# Patient Record
Sex: Female | Born: 1970 | Race: White | Hispanic: No | Marital: Married | Smoking: Never smoker
Health system: Southern US, Community
[De-identification: ages and names within clinical notes are randomized; demographics above are authoritative.]

## PROBLEM LIST (undated history)

## (undated) DIAGNOSIS — E785 Hyperlipidemia, unspecified: Secondary | ICD-10-CM

## (undated) DIAGNOSIS — Z87442 Personal history of urinary calculi: Secondary | ICD-10-CM

## (undated) DIAGNOSIS — I519 Heart disease, unspecified: Secondary | ICD-10-CM

## (undated) DIAGNOSIS — Z8042 Family history of malignant neoplasm of prostate: Secondary | ICD-10-CM

## (undated) DIAGNOSIS — Z803 Family history of malignant neoplasm of breast: Secondary | ICD-10-CM

## (undated) DIAGNOSIS — Z923 Personal history of irradiation: Secondary | ICD-10-CM

## (undated) DIAGNOSIS — F419 Anxiety disorder, unspecified: Secondary | ICD-10-CM

## (undated) DIAGNOSIS — F32A Depression, unspecified: Secondary | ICD-10-CM

## (undated) DIAGNOSIS — N6019 Diffuse cystic mastopathy of unspecified breast: Secondary | ICD-10-CM

## (undated) DIAGNOSIS — R112 Nausea with vomiting, unspecified: Secondary | ICD-10-CM

## (undated) DIAGNOSIS — E0590 Thyrotoxicosis, unspecified without thyrotoxic crisis or storm: Secondary | ICD-10-CM

## (undated) DIAGNOSIS — Z9889 Other specified postprocedural states: Secondary | ICD-10-CM

## (undated) DIAGNOSIS — E0500 Thyrotoxicosis with diffuse goiter without thyrotoxic crisis or storm: Secondary | ICD-10-CM

## (undated) HISTORY — DX: Family history of malignant neoplasm of breast: Z80.3

## (undated) HISTORY — DX: Thyrotoxicosis, unspecified without thyrotoxic crisis or storm: E05.90

## (undated) HISTORY — PX: WISDOM TOOTH EXTRACTION: SHX21

## (undated) HISTORY — PX: OTHER SURGICAL HISTORY: SHX169

## (undated) HISTORY — PX: BREAST BIOPSY: SHX20

## (undated) HISTORY — DX: Heart disease, unspecified: I51.9

## (undated) HISTORY — PX: BREAST EXCISIONAL BIOPSY: SUR124

## (undated) HISTORY — DX: Thyrotoxicosis with diffuse goiter without thyrotoxic crisis or storm: E05.00

## (undated) HISTORY — DX: Hyperlipidemia, unspecified: E78.5

## (undated) HISTORY — DX: Family history of malignant neoplasm of prostate: Z80.42

## (undated) HISTORY — DX: Diffuse cystic mastopathy of unspecified breast: N60.19

---

## 2001-10-14 ENCOUNTER — Other Ambulatory Visit: Admission: RE | Admit: 2001-10-14 | Discharge: 2001-10-14 | Payer: Self-pay | Admitting: Family Medicine

## 2002-11-12 ENCOUNTER — Encounter: Payer: Self-pay | Admitting: Family Medicine

## 2002-11-12 ENCOUNTER — Encounter: Admission: RE | Admit: 2002-11-12 | Discharge: 2002-11-12 | Payer: Self-pay | Admitting: Family Medicine

## 2005-05-22 ENCOUNTER — Ambulatory Visit: Payer: Self-pay

## 2006-06-19 HISTORY — PX: BREAST BIOPSY: SHX20

## 2006-06-19 HISTORY — PX: ORBITAL RECONSTRUCTION: SHX2115

## 2007-01-18 ENCOUNTER — Ambulatory Visit: Payer: Self-pay | Admitting: Family Medicine

## 2007-01-30 ENCOUNTER — Ambulatory Visit: Payer: Self-pay | Admitting: General Surgery

## 2007-02-01 ENCOUNTER — Ambulatory Visit: Payer: Self-pay | Admitting: General Surgery

## 2007-07-22 ENCOUNTER — Ambulatory Visit: Payer: Self-pay | Admitting: General Surgery

## 2008-02-18 ENCOUNTER — Ambulatory Visit: Payer: Self-pay | Admitting: Oncology

## 2008-07-23 ENCOUNTER — Ambulatory Visit: Payer: Self-pay | Admitting: General Surgery

## 2009-02-04 LAB — HM PAP SMEAR: HM PAP: NEGATIVE

## 2009-06-19 HISTORY — PX: DILATION AND CURETTAGE OF UTERUS: SHX78

## 2009-07-26 ENCOUNTER — Ambulatory Visit: Payer: Self-pay | Admitting: General Surgery

## 2010-02-13 ENCOUNTER — Emergency Department: Payer: Self-pay | Admitting: Internal Medicine

## 2010-02-15 ENCOUNTER — Emergency Department: Payer: Self-pay | Admitting: Emergency Medicine

## 2010-02-16 ENCOUNTER — Ambulatory Visit: Payer: Self-pay | Admitting: Obstetrics & Gynecology

## 2010-07-27 ENCOUNTER — Ambulatory Visit: Payer: Self-pay | Admitting: General Surgery

## 2011-07-21 HISTORY — PX: BREAST BIOPSY: SHX20

## 2011-08-01 ENCOUNTER — Ambulatory Visit: Payer: Self-pay | Admitting: General Surgery

## 2011-08-04 ENCOUNTER — Ambulatory Visit: Payer: Self-pay | Admitting: General Surgery

## 2012-08-27 ENCOUNTER — Encounter: Payer: Self-pay | Admitting: *Deleted

## 2012-08-29 ENCOUNTER — Ambulatory Visit: Payer: Self-pay | Admitting: General Surgery

## 2012-09-03 ENCOUNTER — Encounter: Payer: Self-pay | Admitting: *Deleted

## 2012-09-04 ENCOUNTER — Encounter: Payer: Self-pay | Admitting: General Surgery

## 2012-09-04 ENCOUNTER — Ambulatory Visit: Payer: No Typology Code available for payment source | Admitting: General Surgery

## 2012-09-04 ENCOUNTER — Other Ambulatory Visit (INDEPENDENT_AMBULATORY_CARE_PROVIDER_SITE_OTHER): Payer: Self-pay

## 2012-09-04 VITALS — BP 140/80 | HR 80 | Resp 12 | Ht 64.0 in | Wt 184.0 lb

## 2012-09-04 DIAGNOSIS — N63 Unspecified lump in unspecified breast: Secondary | ICD-10-CM

## 2012-09-04 NOTE — Progress Notes (Signed)
Subjective:     Patient ID: Beth [name], female   DOB: 1970-10-04, 42 y.o.   MRN: [id number]  HPI 42 yr old female with prior breast mass and FH of breastr ca-mat GM, seen in yrly f/u. No breast complaints. No new health issues.Patient here today for follow up  mammogram.     Review of Systems  Constitutional: Negative.   Respiratory: Negative.   Cardiovascular: Negative.        Objective:   Physical Exam  Constitutional: She is oriented to person, place, and time. She appears well-developed and well-nourished.  Cardiovascular: Normal rate, regular rhythm and normal heart sounds.   Pulmonary/Chest: Effort normal and breath sounds normal. Right breast exhibits mass. Right breast exhibits no inverted nipple, no nipple discharge, no skin change and no tenderness. Left breast exhibits no inverted nipple, no mass, no nipple discharge, no skin change and no tenderness. Breasts are symmetrical.    Abdominal: Soft. Normal appearance. There is no tenderness.  Lymphadenopathy:    She has no cervical adenopathy.    She has no axillary adenopathy.  Neurological: She is alert and oriented to person, place, and time.  Skin: Skin is warm and dry.       Assessment:     Focal thickening in right breast at 11 o'clock was evaluated with ultrasound showing vague finding.  Likely this associated with previous biopsy but will need close follow up.    Plan:     Plan for recheck in 4 mos. Pt advised to call for any noticed changes in the interval.

## 2013-01-08 ENCOUNTER — Encounter: Payer: Self-pay | Admitting: General Surgery

## 2013-01-08 ENCOUNTER — Ambulatory Visit (INDEPENDENT_AMBULATORY_CARE_PROVIDER_SITE_OTHER): Payer: No Typology Code available for payment source | Admitting: General Surgery

## 2013-01-08 VITALS — BP 124/80 | HR 76 | Resp 14 | Ht 64.0 in | Wt 181.0 lb

## 2013-01-08 DIAGNOSIS — N63 Unspecified lump in unspecified breast: Secondary | ICD-10-CM

## 2013-01-08 NOTE — Progress Notes (Signed)
Patient ID: Beth [name], female   DOB: [birth year], 42 y.o.   MRN: [id number]  Chief Complaint  Patient presents with  . Follow-up    4 month follow up right breast ultrasound    HPI Beth [name] is a 42 y.o. female who presents for a 4 month follow up right breast evaluation  HPI  Past Medical History  Diagnosis Date  . Graves disease   . Cardiac disease   . Family history of malignant neoplasm of breast   . Hyperthyroidism   . Diffuse cystic mastopathy     Past Surgical History  Procedure Laterality Date  . Orbital reconstruction Left 2008  . Eyelid surgery     . Wisdom tooth extraction    . Dilation and curettage of uterus  2011  . Breast biopsy  2008  . Breast biopsy Left 2/13     aspiration cyst    Family History  Problem Relation Age of Onset  . Breast cancer Maternal Grandmother     Social History History  Substance Use Topics  . Smoking status: Never Smoker   . Smokeless tobacco: Never Used  . Alcohol Use: Yes    No Known Allergies  Current Outpatient Prescriptions  Medication Sig Dispense Refill  . Desogestrel-Ethinyl Estradiol (MIRCETTE PO) Take by mouth.      . FIBER FORMULA PO Take by mouth.      . fish oil-omega-3 fatty acids 1000 MG capsule Take 2 [name] by mouth daily.      . Garlic Oil 1000 MG CAPS Take by mouth.      . Multiple Vitamins-Minerals (MULTIVITAMIN PO) Take by mouth.      . traZODone (DESYREL) 100 MG tablet Take 100 mg by mouth at bedtime.       No current facility-administered medications for this visit.    Review of Systems Review of Systems  Constitutional: Negative.   Respiratory: Negative.   Cardiovascular: Negative.     Blood pressure 124/80, pulse 76, resp. rate 14, height 5\' 4"  (1.626 m), weight 181 lb (82.101 kg), last menstrual period 12/26/2012.  Physical Exam Physical Exam  Constitutional: She is oriented to person, place, and time. She appears well-developed and well-nourished.  Pulmonary/Chest:    Neurological:  She is alert and oriented to person, place, and time.  Skin: Skin is warm and dry.    Data Reviewed None  Assessment    Mass in the right breast resolved.      Plan    Return in 1 year for follow up.         Beth [name] 01/08/2013, 7:17 PM

## 2013-01-08 NOTE — Patient Instructions (Addendum)
Patient to return in 8 months with yearly mammogram.  Continue monthly self breast exam.

## 2013-03-07 ENCOUNTER — Ambulatory Visit: Payer: Self-pay | Admitting: Oncology

## 2013-03-17 ENCOUNTER — Ambulatory Visit: Payer: Self-pay | Admitting: Oncology

## 2013-03-18 LAB — CBC CANCER CENTER
Eosinophil #: 0.1 x10 3/mm (ref 0.0–0.7)
HCT: 40.1 % (ref 35.0–47.0)
HGB: 13.2 g/dL (ref 12.0–16.0)
MCH: 28.9 pg (ref 26.0–34.0)
MCHC: 33 g/dL (ref 32.0–36.0)
Monocyte %: 4.4 %
Neutrophil #: 8.5 x10 3/mm — ABNORMAL HIGH (ref 1.4–6.5)
Platelet: 443 x10 3/mm — ABNORMAL HIGH (ref 150–440)
RDW: 13.8 % (ref 11.5–14.5)
WBC: 11.5 x10 3/mm — ABNORMAL HIGH (ref 3.6–11.0)

## 2013-03-19 ENCOUNTER — Ambulatory Visit: Payer: Self-pay | Admitting: Oncology

## 2013-04-19 ENCOUNTER — Ambulatory Visit: Payer: Self-pay | Admitting: Oncology

## 2013-09-01 ENCOUNTER — Encounter: Payer: Self-pay | Admitting: General Surgery

## 2013-09-01 ENCOUNTER — Ambulatory Visit: Payer: Self-pay | Admitting: General Surgery

## 2013-09-01 NOTE — Progress Notes (Signed)
Quick Note:  Make sure the additional views are done prior to her office visit ______ 

## 2013-09-15 ENCOUNTER — Ambulatory Visit: Payer: Self-pay | Admitting: General Surgery

## 2013-09-15 ENCOUNTER — Encounter: Payer: Self-pay | Admitting: General Surgery

## 2013-09-23 ENCOUNTER — Encounter: Payer: Self-pay | Admitting: General Surgery

## 2013-09-23 ENCOUNTER — Ambulatory Visit (INDEPENDENT_AMBULATORY_CARE_PROVIDER_SITE_OTHER): Payer: No Typology Code available for payment source | Admitting: General Surgery

## 2013-09-23 VITALS — BP 120/78 | HR 80 | Resp 14 | Ht 64.0 in | Wt 189.0 lb

## 2013-09-23 DIAGNOSIS — Z803 Family history of malignant neoplasm of breast: Secondary | ICD-10-CM

## 2013-09-23 DIAGNOSIS — N6019 Diffuse cystic mastopathy of unspecified breast: Secondary | ICD-10-CM

## 2013-09-23 NOTE — Progress Notes (Signed)
Patient ID: Beth [name], female   DOB: [birth year], 43 y.o.   MRN: [id number]  Chief Complaint  Patient presents with  . Follow-up    8 month screening mammogram    HPI Beth [name] is a 43 y.o. female who presents for a breast evaluation. The most recent mammogram was done on 09/15/13. Patient does perform regular self breast checks and gets regular mammograms done. The patient denies any new problems with her breasts at this time.     HPI  Past Medical History  Diagnosis Date  . Graves disease   . Cardiac disease   . Family history of malignant neoplasm of breast   . Hyperthyroidism   . Diffuse cystic mastopathy     Past Surgical History  Procedure Laterality Date  . Orbital reconstruction Left 2008  . Eyelid surgery     . Wisdom tooth extraction    . Dilation and curettage of uterus  2011  . Breast biopsy  2008  . Breast biopsy Left 2/13     aspiration cyst    Family History  Problem Relation Age of Onset  . Breast cancer Maternal Grandmother     Social History History  Substance Use Topics  . Smoking status: Never Smoker   . Smokeless tobacco: Never Used  . Alcohol Use: Yes    No Known Allergies  Current Outpatient Prescriptions  Medication Sig Dispense Refill  . CRANBERRY PO Take 2 tablets by mouth 2 (two) times daily.      . Desogestrel-Ethinyl Estradiol (MIRCETTE PO) Take by mouth.      . FIBER FORMULA PO Take by mouth.      . fish oil-omega-3 fatty acids 1000 MG capsule Take 2 g by mouth daily.      . Garlic Oil 2725 MG CAPS Take by mouth.      . Ibuprofen-Diphenhydramine Cit (ADVIL PM PO) Take 1 tablet by mouth as needed.      . Multiple Vitamins-Minerals (MULTIVITAMIN PO) Take by mouth.       No current facility-administered medications for this visit.    Review of Systems Review of Systems  Constitutional: Negative.   Respiratory: Negative.   Cardiovascular: Negative.     Blood pressure 120/78, pulse 80, resp. rate 14, height 5\' 4"  (1.626 m),  weight 189 lb (85.73 kg), last menstrual period 09/04/2013.  Physical Exam Physical Exam  Constitutional: She is oriented to person, place, and time. She appears well-developed and well-nourished.  Eyes: Conjunctivae are normal. No scleral icterus.  Neck: Neck supple. No thyromegaly present.  Cardiovascular: Normal rate, regular rhythm and normal heart sounds.   No murmur heard. Pulmonary/Chest: Effort normal and breath sounds normal. Right breast exhibits no inverted nipple, no mass, no nipple discharge, no skin change and no tenderness. Left breast exhibits no inverted nipple, no mass, no nipple discharge, no skin change and no tenderness.  Lymphadenopathy:    She has no cervical adenopathy.    She has no axillary adenopathy.  Neurological: She is alert and oriented to person, place, and time.  Skin: Skin is warm and dry.    Data Reviewed Mammogram and US showing a cyst in right breast  Assessment    FCD, exam stable.     Plan    61yr f/u with bil screening mammogram.        Mahathi Pokorney G Tyjah Hai 09/25/2013, 8:22 AM

## 2013-09-23 NOTE — Patient Instructions (Addendum)
Patient to return in 1 year with bilateral screening mammogram. Continue self breast exams. Call office for any new breast issues or concerns.  

## 2013-09-25 ENCOUNTER — Encounter: Payer: Self-pay | Admitting: General Surgery

## 2013-09-25 DIAGNOSIS — N6019 Diffuse cystic mastopathy of unspecified breast: Secondary | ICD-10-CM | POA: Insufficient documentation

## 2013-09-25 DIAGNOSIS — Z803 Family history of malignant neoplasm of breast: Secondary | ICD-10-CM | POA: Insufficient documentation

## 2013-11-13 ENCOUNTER — Emergency Department: Payer: Self-pay | Admitting: Emergency Medicine

## 2013-12-08 ENCOUNTER — Emergency Department: Payer: Self-pay | Admitting: Emergency Medicine

## 2014-01-19 LAB — BASIC METABOLIC PANEL
BUN: 12 mg/dL (ref 4–21)
CREATININE: 0.6 mg/dL (ref 0.5–1.1)
GLUCOSE: 97 mg/dL
POTASSIUM: 4.7 mmol/L (ref 3.4–5.3)
Sodium: 139 mmol/L (ref 137–147)

## 2014-01-19 LAB — LIPID PANEL
Cholesterol: 251 mg/dL — AB (ref 0–200)
HDL: 71 mg/dL — AB (ref 35–70)
LDL Cholesterol: 157 mg/dL
TRIGLYCERIDES: 115 mg/dL (ref 40–160)

## 2014-01-19 LAB — TSH: TSH: 1.32 u[IU]/mL (ref 0.41–5.90)

## 2014-01-19 LAB — HEPATIC FUNCTION PANEL
ALT: 20 U/L (ref 7–35)
AST: 13 U/L (ref 13–35)

## 2014-01-19 LAB — CBC AND DIFFERENTIAL
HCT: 38 % (ref 36–46)
Hemoglobin: 12.7 g/dL (ref 12.0–16.0)
Platelets: 455 10*3/uL — AB (ref 150–399)
WBC: 11.7 10^3/mL

## 2014-04-20 ENCOUNTER — Encounter: Payer: Self-pay | Admitting: General Surgery

## 2014-08-18 LAB — HEMOGLOBIN A1C: HEMOGLOBIN A1C: 5.5 % (ref 4.0–6.0)

## 2014-10-05 ENCOUNTER — Encounter: Payer: Self-pay | Admitting: General Surgery

## 2014-10-07 ENCOUNTER — Encounter: Payer: Self-pay | Admitting: General Surgery

## 2014-10-07 ENCOUNTER — Ambulatory Visit (INDEPENDENT_AMBULATORY_CARE_PROVIDER_SITE_OTHER): Payer: No Typology Code available for payment source | Admitting: General Surgery

## 2014-10-07 VITALS — BP 128/74 | HR 76 | Resp 12 | Ht 62.0 in | Wt 185.0 lb

## 2014-10-07 DIAGNOSIS — N6019 Diffuse cystic mastopathy of unspecified breast: Secondary | ICD-10-CM | POA: Diagnosis not present

## 2014-10-07 DIAGNOSIS — Z803 Family history of malignant neoplasm of breast: Secondary | ICD-10-CM

## 2014-10-07 DIAGNOSIS — N63 Unspecified lump in unspecified breast: Secondary | ICD-10-CM

## 2014-10-07 NOTE — Patient Instructions (Signed)
Continue self breast exams. Call office for any new breast issues or concerns. 

## 2014-10-07 NOTE — Progress Notes (Signed)
Patient ID: Beth [name], female   DOB: 1971-03-03, 44 y.o.   MRN: [id number]  Chief Complaint  Patient presents with  . Follow-up    mammogram    HPI Beth [name] is a 44 y.o. female. who presents for a breast evaluation. The most recent mammogram was done on 10/01/14 .  Patient does perform regular self breast checks and gets regular mammograms done.  Occasional itching of the right breast/dry skin.    HPI  Past Medical History  Diagnosis Date  . Graves disease   . Cardiac disease   . Family history of malignant neoplasm of breast   . Hyperthyroidism   . Diffuse cystic mastopathy     Past Surgical History  Procedure Laterality Date  . Orbital reconstruction Left 2008  . Eyelid surgery     . Wisdom tooth extraction    . Dilation and curettage of uterus  2011  . Breast biopsy  2008  . Breast biopsy Left 2/13     aspiration cyst    Family History  Problem Relation Age of Onset  . Breast cancer Maternal Grandmother     Social History History  Substance Use Topics  . Smoking status: Never Smoker   . Smokeless tobacco: Never Used  . Alcohol Use: Yes    No Known Allergies  Current Outpatient Prescriptions  Medication Sig Dispense Refill  . CRANBERRY PO Take 2 tablets by mouth 2 (two) times daily.    . Desogestrel-Ethinyl Estradiol (MIRCETTE PO) Take by mouth.    . fish oil-omega-3 fatty acids 1000 MG capsule Take 2 g by mouth daily.    . Garlic Oil 3500 MG CAPS Take by mouth.    . Ibuprofen-Diphenhydramine Cit (ADVIL PM PO) Take 1 tablet by mouth as needed.    . Multiple Vitamins-Minerals (MULTIVITAMIN PO) Take by mouth.     No current facility-administered medications for this visit.    Review of Systems Review of Systems  Constitutional: Negative.   Respiratory: Negative.   Cardiovascular: Negative.     Blood pressure 128/74, pulse 76, resp. rate 12, height 5\' 2"  (1.575 m), weight 185 lb (83.915 kg), last menstrual period 10/01/2014.  Physical  Exam Physical Exam  Constitutional: She is oriented to person, place, and time. She appears well-developed and well-nourished.  Eyes: Conjunctivae are normal. No scleral icterus.  Neck: Neck supple.  Cardiovascular: Normal rate, regular rhythm and normal heart sounds.   Pulmonary/Chest: Effort normal and breath sounds normal. Right breast exhibits no inverted nipple, no mass, no nipple discharge, no skin change and no tenderness. Left breast exhibits no inverted nipple, no mass, no nipple discharge, no skin change and no tenderness.  Abdominal: Soft. Bowel sounds are normal. There is no hepatomegaly. There is no tenderness.  Lymphadenopathy:    She has no cervical adenopathy.    She has no axillary adenopathy.  Neurological: She is alert and oriented to person, place, and time.  Skin: Skin is warm and dry.    Data Reviewed Mammogram reviewed and stable benign nodule left breast. Remote FH of breast cancer  Assessment    Stable exam, fibrocystic disease.       Plan    The patient has been asked to return to the office in one year with a bilateral screening mammogram.   PCP:  Otho Darner 10/07/2014, 11:35 AM

## 2014-10-23 DIAGNOSIS — E0500 Thyrotoxicosis with diffuse goiter without thyrotoxic crisis or storm: Secondary | ICD-10-CM | POA: Insufficient documentation

## 2014-10-23 DIAGNOSIS — G4700 Insomnia, unspecified: Secondary | ICD-10-CM | POA: Insufficient documentation

## 2014-10-23 DIAGNOSIS — R739 Hyperglycemia, unspecified: Secondary | ICD-10-CM | POA: Insufficient documentation

## 2014-10-23 DIAGNOSIS — J309 Allergic rhinitis, unspecified: Secondary | ICD-10-CM | POA: Insufficient documentation

## 2014-11-20 ENCOUNTER — Encounter: Payer: Self-pay | Admitting: General Surgery

## 2015-01-15 ENCOUNTER — Ambulatory Visit (INDEPENDENT_AMBULATORY_CARE_PROVIDER_SITE_OTHER): Payer: PRIVATE HEALTH INSURANCE | Admitting: Family Medicine

## 2015-01-15 ENCOUNTER — Encounter: Payer: Self-pay | Admitting: Family Medicine

## 2015-01-15 VITALS — BP 112/68 | HR 80 | Temp 98.2°F | Resp 16 | Ht 65.0 in | Wt 185.0 lb

## 2015-01-15 DIAGNOSIS — R739 Hyperglycemia, unspecified: Secondary | ICD-10-CM

## 2015-01-15 DIAGNOSIS — E7800 Pure hypercholesterolemia, unspecified: Secondary | ICD-10-CM

## 2015-01-15 DIAGNOSIS — E0500 Thyrotoxicosis with diffuse goiter without thyrotoxic crisis or storm: Secondary | ICD-10-CM

## 2015-01-15 DIAGNOSIS — Z0000 Encounter for general adult medical examination without abnormal findings: Secondary | ICD-10-CM | POA: Diagnosis not present

## 2015-01-15 LAB — POCT URINALYSIS DIPSTICK
BILIRUBIN UA: NEGATIVE
Blood, UA: NEGATIVE
GLUCOSE UA: NEGATIVE
Ketones, UA: NEGATIVE
Leukocytes, UA: NEGATIVE
Nitrite, UA: NEGATIVE
PH UA: 7
Protein, UA: NEGATIVE
Spec Grav, UA: 1.02
Urobilinogen, UA: 0.2

## 2015-01-15 NOTE — Progress Notes (Signed)
Patient ID: Beth [name], female   DOB: 1970-11-25, 44 y.o.   MRN: [id number]       Patient: Beth [name], Female    DOB: 22-Dec-1970, 44 y.o.   MRN: [id number] Visit Date: 01/17/2015  Today's Provider: Margarita Rana, MD   Chief Complaint  Patient presents with  . Annual Exam   Subjective:    Annual physical exam Beth [name] is a 44 y.o. female who presents today for health maintenance and complete physical. She feels well. She reports she is trying to exercising, on average she tries yo exercise about 3 times a week. She reports she is sleeping fairly well. Pt gets pap smear and mammograms done through her GYN.   ----------------------------------------------------------------- Pap-- 12/2013 done at [facility]- 09/2014 EKG-- 12/15/11 Never had a colonoscopy.    Review of Systems  Constitutional: Negative.   HENT: Negative.   Eyes: Negative.   Respiratory: Negative.   Cardiovascular: Negative.   Gastrointestinal:       Occasional heart burn at night  Endocrine: Negative.   Genitourinary: Negative.   Musculoskeletal: Negative.   Skin: Negative.   Allergic/Immunologic: Negative.   Neurological: Negative.   Hematological: Negative.   Psychiatric/Behavioral: Negative.     Social History She  reports that she has never smoked. She has never used smokeless tobacco. She reports that she drinks alcohol. She reports that she does not use illicit drugs.  Patient Active Problem List   Diagnosis Date Noted  . Allergic rhinitis 10/23/2014  . Graves disease 10/23/2014  . Blood glucose elevated 10/23/2014  . Cannot sleep 10/23/2014  . Family history of breast cancer 09/25/2013  . Diffuse cystic mastopathy 09/25/2013  . 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 Hypercholesteremia 11/17/2008  . Dupuytren's contracture of foot 11/17/2008    Past Surgical History  Procedure Laterality Date  . Orbital reconstruction Left 2008  . Eyelid surgery     . Wisdom tooth extraction    .  Dilation and curettage of uterus  2011  . Breast biopsy  2008  . Breast biopsy Left 2/13     aspiration cyst    Family History Her family history includes Breast cancer in her maternal grandmother; Diabetes in her mother and sister; Hyperlipidemia in her father, mother, sister, sister, and sister; Hypertension in her father, mother, sister, sister, and sister; Hypothyroidism in her father; Stroke in her father.    No Known Allergies  Previous Medications   ACETAMINOPHEN (TYLENOL) 325 MG TABLET    Take by mouth.   CALCIUM CARBONATE-VITAMIN D 600-200 MG-UNIT TABS    Take by mouth.   CRANBERRY PO    Take 2 tablets by mouth 2 (two) times daily.   DESOGESTREL-ETHINYL ESTRADIOL (MIRCETTE PO)    Take by mouth.   FISH OIL-OMEGA-3 FATTY ACIDS 1000 MG CAPSULE    Take 2 g by mouth daily.   GARLIC OIL 1829 MG CAPS    Take by mouth.   IBUPROFEN (ADVIL) 200 MG CAPS    Take by mouth as needed.   IBUPROFEN-DIPHENHYDRAMINE CIT (ADVIL PM PO)    Take 1 tablet by mouth as needed.   MULTIPLE VITAMINS-MINERALS (MULTIVITAMIN PO)    Take by mouth.    Patient Care Team: Margarita Rana, MD as PCP - General (Family Medicine) Seeplaputhur Robinette Haines, MD (General Surgery) Margarita Rana, MD as Referring Physician (Family Medicine)     Objective:   Vitals: BP 112/68 mmHg  Pulse 80  Temp(Src) 98.2 F (36.8 C) (Oral)  Resp 16  Ht 5\' 5"  (1.651 m)  Wt 185 lb (83.915 kg)  BMI 30.79 kg/m2  LMP 10/01/2014   Physical Exam  Constitutional: She is oriented to person, place, and time. She appears well-developed and well-nourished.  Eyes: Conjunctivae and EOM are normal. Pupils are equal, round, and reactive to light.  Neck: Normal range of motion. Neck supple.  Cardiovascular: Normal rate and regular rhythm.   Pulmonary/Chest: Effort normal and breath sounds normal.  Abdominal: Soft. Bowel sounds are normal.  Musculoskeletal: Normal range of motion.  Neurological: She is alert and oriented to person, place, and  time.  Psychiatric: She has a normal mood and affect. Her behavior is normal. Judgment and thought content normal.     Assessment & Plan:     Routine Health Maintenance and Physical Exam  Exercise Activities and Dietary recommendations Goals    . Exercise 150 minutes per week (moderate activity)       Immunization History  Administered Date(s) Administered  . Tdap 01/31/2008    Health Maintenance  Topic Date Due  . HIV Screening  09/23/1984  . PAP SMEAR  02/05/2012  . INFLUENZA VACCINE  01/18/2015  . TETANUS/TDAP  01/30/2018   - POCT urinalysis dipstick Results for orders placed or performed in visit on 01/15/15  POCT urinalysis dipstick  Result Value Ref Range   Color, UA amber    Clarity, UA clear    Glucose, UA neg    Bilirubin, UA neg    Ketones, UA neg    Spec Grav, UA 1.020    Blood, UA neg    pH, UA 7.0    Protein, UA neg    Urobilinogen, UA 0.2    Nitrite, UA neg    Leukocytes, UA Negative Negative    2. Graves disease Check labs.  - TSH  3. Hypercholesteremia- Check labs.  - CBC with Differential/Platelet - Lipid panel - Comprehensive metabolic panel  4. Blood glucose elevated Check labs.  - Hemoglobin A1c  Discussed health benefits of physical activity, and encouraged her to engage in regular exercise appropriate for her age and condition.    --------------------------------------------------------------------  Margarita Rana, MD

## 2015-01-28 LAB — HM PAP SMEAR: HM PAP: NEGATIVE

## 2015-01-29 ENCOUNTER — Telehealth: Payer: Self-pay

## 2015-01-29 LAB — COMPREHENSIVE METABOLIC PANEL
ALBUMIN: 4.2 g/dL (ref 3.5–5.5)
ALT: 18 IU/L (ref 0–32)
AST: 16 IU/L (ref 0–40)
Albumin/Globulin Ratio: 1.8 (ref 1.1–2.5)
Alkaline Phosphatase: 80 IU/L (ref 39–117)
BUN/Creatinine Ratio: 18 (ref 9–23)
BUN: 10 mg/dL (ref 6–24)
Bilirubin Total: 0.3 mg/dL (ref 0.0–1.2)
CALCIUM: 9.9 mg/dL (ref 8.7–10.2)
CHLORIDE: 100 mmol/L (ref 97–108)
CO2: 22 mmol/L (ref 18–29)
Creatinine, Ser: 0.55 mg/dL — ABNORMAL LOW (ref 0.57–1.00)
GFR calc Af Amer: 131 mL/min/{1.73_m2} (ref >59)
GFR calc non Af Amer: 114 mL/min/{1.73_m2} (ref >59)
GLUCOSE: 90 mg/dL (ref 65–99)
Globulin, Total: 2.3 g/dL (ref 1.5–4.5)
POTASSIUM: 5.2 mmol/L (ref 3.5–5.2)
Sodium: 141 mmol/L (ref 134–144)
TOTAL PROTEIN: 6.5 g/dL (ref 6.0–8.5)

## 2015-01-29 LAB — LIPID PANEL
CHOL/HDL RATIO: 3.9 ratio (ref 0.0–4.4)
Cholesterol, Total: 263 mg/dL — ABNORMAL HIGH (ref 100–199)
HDL: 68 mg/dL (ref >39)
LDL Calculated: 170 mg/dL — ABNORMAL HIGH (ref 0–99)
Triglycerides: 126 mg/dL (ref 0–149)
VLDL CHOLESTEROL CAL: 25 mg/dL (ref 5–40)

## 2015-01-29 LAB — CBC WITH DIFFERENTIAL/PLATELET
BASOS ABS: 0 10*3/uL (ref 0.0–0.2)
Basos: 0 %
EOS (ABSOLUTE): 0.1 10*3/uL (ref 0.0–0.4)
EOS: 1 %
HEMATOCRIT: 39.4 % (ref 34.0–46.6)
HEMOGLOBIN: 12.7 g/dL (ref 11.1–15.9)
IMMATURE GRANS (ABS): 0 10*3/uL (ref 0.0–0.1)
Immature Granulocytes: 0 %
Lymphocytes Absolute: 3.5 10*3/uL — ABNORMAL HIGH (ref 0.7–3.1)
Lymphs: 30 %
MCH: 28.8 pg (ref 26.6–33.0)
MCHC: 32.2 g/dL (ref 31.5–35.7)
MCV: 89 fL (ref 79–97)
MONOCYTES: 5 %
Monocytes Absolute: 0.6 10*3/uL (ref 0.1–0.9)
Neutrophils Absolute: 7.3 10*3/uL — ABNORMAL HIGH (ref 1.4–7.0)
Neutrophils: 64 %
PLATELETS: 425 10*3/uL — AB (ref 150–379)
RBC: 4.41 x10E6/uL (ref 3.77–5.28)
RDW: 14 % (ref 12.3–15.4)
WBC: 11.5 10*3/uL — ABNORMAL HIGH (ref 3.4–10.8)

## 2015-01-29 LAB — TSH: TSH: 0.951 u[IU]/mL (ref 0.450–4.500)

## 2015-01-29 LAB — HEMOGLOBIN A1C
Est. average glucose Bld gHb Est-mCnc: 120 mg/dL
Hgb A1c MFr Bld: 5.8 % — ABNORMAL HIGH (ref 4.8–5.6)

## 2015-01-29 NOTE — Telephone Encounter (Signed)
Advised pt of lab results. Pt verbally acknowledges understanding. Pt would like to work on lifestyle changes. Renaldo Fiddler, CMA

## 2015-01-29 NOTE — Telephone Encounter (Signed)
-----   Message from Margarita Rana, MD sent at 01/29/2015  4:36 PM EDT ----- Labs stable. White cell count is unchanged. Cholesterol is elevated at 263.  Good cholesterol is high at 68 which is good but does still have high bad cholesterol at 170.  10 year risk of heart disease calculated at 2 percent. Can start medication for better control or continue lifestyle changes and recheck annually. Thanks.

## 2015-04-19 ENCOUNTER — Ambulatory Visit
Admission: EM | Admit: 2015-04-19 | Discharge: 2015-04-19 | Disposition: A | Discharge status: Home / Self Care | Payer: PRIVATE HEALTH INSURANCE | Attending: Family Medicine | Admitting: Family Medicine

## 2015-04-19 DIAGNOSIS — N390 Urinary tract infection, site not specified: Secondary | ICD-10-CM | POA: Diagnosis not present

## 2015-04-19 LAB — URINALYSIS COMPLETE WITH MICROSCOPIC (ARMC ONLY)
Bilirubin Urine: NEGATIVE
GLUCOSE, UA: NEGATIVE mg/dL
Nitrite: NEGATIVE
PH: 7.5 (ref 5.0–8.0)
PROTEIN: 100 mg/dL — AB
SPECIFIC GRAVITY, URINE: 1.025 (ref 1.005–1.030)

## 2015-04-19 MED ORDER — PHENAZOPYRIDINE HCL 200 MG PO TABS: 200.0000 mg | ORAL_TABLET | Freq: Three times a day (TID) | ORAL | Status: DC | PRN

## 2015-04-19 MED ORDER — CIPROFLOXACIN HCL 500 MG PO TABS: 500.0000 mg | ORAL_TABLET | Freq: Two times a day (BID) | ORAL | Status: DC

## 2015-04-19 NOTE — Discharge Instructions (Signed)
Urinary Tract Infection A urinary tract infection (UTI) can occur any place along the urinary tract. The tract includes the kidneys, ureters, bladder, and urethra. A type of germ called bacteria often causes a UTI. UTIs are often helped with antibiotic medicine.  HOME CARE   If given, take antibiotics as told by your doctor. Finish them even if you start to feel better.  Drink enough fluids to keep your pee (urine) clear or pale yellow.  Avoid tea, drinks with caffeine, and bubbly (carbonated) drinks.  Pee often. Avoid holding your pee in for a long time.  Pee before and after having sex (intercourse).  Wipe from front to back after you poop (bowel movement) if you are a woman. Use each tissue only once. GET HELP RIGHT AWAY IF:   You have back pain.  You have lower belly (abdominal) pain.  You have chills.  You feel sick to your stomach (nauseous).  You throw up (vomit).  Your burning or discomfort with peeing does not go away.  You have a fever.  Your symptoms are not better in 3 days. MAKE SURE YOU:   Understand these instructions.  Will watch your condition.  Will get help right away if you are not doing well or get worse.   This information is not intended to replace advice given to you by your health care provider. Make sure you discuss any questions you have with your health care provider.   Document Released: 11/22/2007 Document Revised: 06/26/2014 Document Reviewed: 01/04/2012 Elsevier Interactive Patient Education 2016 [employer].  Urine Culture and Sensitivity Testing WHY AM I HAVING THIS TEST?  A urine culture is a test to see if germs grow from your urine sample. Normally, urine is free of germs (sterile). Germs in urine are usually bacteria. Sometimes they can be yeasts. These germs can cause a urinary tract infection (UTI). You may have this test if you have symptoms of a UTI. These may include:  Frequent urination.  Burning pain when passing  urine. If you are pregnant, your health care provider may order this test to screen you for a UTI. When you pass urine, the urine flows through the tube that empties your bladder (urethra). In men, urine comes out through an opening at the tip of the penis. In women, it comes out of the body from just above the vaginal opening. These areas may have bacteria near them that normally live on the skin (normal flora). WHAT KIND OF SAMPLE IS TAKEN? A urine sample for a culture test must be collected in a way that keeps normal flora from getting into the sample. The method used most often is called a clean-catch sample. In a few cases, urine may need to be collected directly from the bladder using a thin, flexible tube (catheter). The health care provider puts the catheter through the person's urethra and into the bladder. Your urine sample will be placed onto plates containing a substance that encourages bacteria to grow (agar plates). These plates are kept at body temperature for 24-48 hours to see if bacteria or other germs grow. Then, a lab technician examines them under a microscope to check for germs. Any germs that grow from the culture will be tested against a variety of medicines to find the one that works best (sensitivity testing). For a UTI caused by bacteria, several types of antibiotic medicines may be tested. HOW DO I PREPARE FOR THE TEST?  Do not urinate for about an hour before collecting the  sample.  Drink a glass of water about 20 minutes before collecting the sample.  Tell your health care provider if you have been taking antibiotics. This may affect the results of your test. Your health care provider may give you sterile wipes to clean your vagina or penis to prepare for collecting a clean-catch sample. To collect the sample, you will need to do the following: For Women and Girls  Sit on the toilet and spread the lips of your vagina.  Use one wipe to clean your vaginal area from  front to back.  Use a second wipe to clean the opening of your urethra.  Pass a small amount of urine directly into the toilet while still spreading your vagina.  Then, hold the sterile cup underneath you and urinate into it.  Fill the cup about halfway. Cap it and return it for testing. For Men and Boys  Use the sterile wipe to clean the tip of your penis.  Pass a small amount of urine directly into the toilet first.  Then, urinate into the sterile cup.  Fill the cup about halfway. Cap it and return it for testing. WHAT DO THE RESULTS MEAN? The result of a urine culture and sensitivity test will be positive or negative.   If enough bacteria grow from your urine sample, your test result is considered positive.  If many different bacteria grow from your urine sample, your test may be reported as contaminated.  If no bacteria grow from your sample after 24-48 hours, your test result is considered negative.  Results of sensitivity testing let your health care provider know which medicines to use to treat your infection. If the results of your urine culture are negative, this means:  It is less likely that you have a UTI.  Your test may be repeated if you still have symptoms. If the results of your urine culture are positive, this means:  It is more likely that you have a UTI.  You may need to start treatment based on your sensitivity results. Talk to your health care provider to discuss your results, treatment options, and if necessary, the need for more tests. It is your responsibility to obtain your test results. Ask the lab or department performing the test when and how you will get your results. Talk with your health care provider if you have any questions about your results.   This information is not intended to replace advice given to you by your health care provider. Make sure you discuss any questions you have with your health care provider.   Document Released:  06/30/2004 Document Revised: 06/26/2014 Document Reviewed: 10/02/2013 Elsevier Interactive Patient Education [employer].

## 2015-04-19 NOTE — ED Notes (Signed)
X 1 day. Pt also c/o urgency. Denies pain.

## 2015-04-19 NOTE — ED Provider Notes (Signed)
CSN: [id number]     Arrival date & time 04/19/15  1754 History   First MD Initiated Contact with Patient 04/19/15 1808    Nurses notes were reviewed. Chief Complaint  Patient presents with  . Urinary Frequency   Patient started having trouble with urinary frequency yesterday. She denies any dysuria but states the frequency has gotten worse this afternoon after getting somewhat better this morning.. She denies any dysuria but manages has frequency according to his significant other she's had about 3 UTIs this year but she usually states she'll have a UTI year. (Consider location/radiation/quality/duration/timing/severity/associated sxs/prior Treatment) Patient is a 44 y.o. female presenting with frequency. The history is provided by the patient. The history is limited by a language barrier. No language interpreter was used.  Urinary Frequency This is a new problem. The current episode started yesterday. The problem occurs constantly. The problem has been rapidly improving. Pertinent negatives include no chest pain, no abdominal pain, no headaches and no shortness of breath. Nothing aggravates the symptoms. Nothing relieves the symptoms. She has tried nothing for the symptoms. The treatment provided no relief.    Past Medical History  Diagnosis Date  . Graves disease   . Cardiac disease   . Family history of malignant neoplasm of breast   . Hyperthyroidism   . Diffuse cystic mastopathy    Past Surgical History  Procedure Laterality Date  . Orbital reconstruction Left 2008  . Eyelid surgery     . Wisdom tooth extraction    . Dilation and curettage of uterus  2011  . Breast biopsy  2008  . Breast biopsy Left 2/13     aspiration cyst   Family History  Problem Relation Age of Onset  . Breast cancer Maternal Grandmother   . Diabetes Mother   . Hypertension Mother   . Hyperlipidemia Mother   . Stroke Father   . Hypertension Father   . Hyperlipidemia Father   . Hypothyroidism Father    . Hyperlipidemia Sister   . Hypertension Sister   . Hyperlipidemia Sister   . Hypertension Sister   . Diabetes Sister   . Hyperlipidemia Sister   . Hypertension Sister    Social History  Substance Use Topics  . Smoking status: Never Smoker   . Smokeless tobacco: Never Used  . Alcohol Use: Yes     Comment: occasional   OB History    Gravida Para Term Preterm AB TAB SAB Ectopic Multiple Living   1 1        1       Obstetric Comments     FIRST PREGNANCY 82 FIRST MENSTRUAL 11     Review of Systems  Respiratory: Negative for shortness of breath.   Cardiovascular: Negative for chest pain.  Gastrointestinal: Negative for abdominal pain.  Genitourinary: Positive for frequency.  Neurological: Negative for headaches.  All other systems reviewed and are negative.   Allergies  Review of patient's allergies indicates no known allergies.  Home Medications   Prior to Admission medications   Medication Sig Start Date End Date Taking? Authorizing Provider  acetaminophen (TYLENOL) 325 MG tablet Take by mouth.   Yes Historical Provider, MD  Calcium Carbonate-Vitamin D 600-200 MG-UNIT TABS Take by mouth. 02/11/10  Yes Historical Provider, MD  CRANBERRY PO Take 2 tablets by mouth 2 (two) times daily.   Yes Historical Provider, MD  Desogestrel-Ethinyl Estradiol (MIRCETTE PO) Take by mouth.   Yes Historical Provider, MD  fish oil-omega-3 fatty acids 1000 MG capsule  Take 2 g by mouth daily.   Yes Historical Provider, MD  Garlic Oil 0867 MG CAPS Take by mouth.   Yes Historical Provider, MD  Ibuprofen (ADVIL) 200 MG CAPS Take by mouth as needed.   Yes Historical Provider, MD  Ibuprofen-Diphenhydramine Cit (ADVIL PM PO) Take 1 tablet by mouth as needed.   Yes Historical Provider, MD  Multiple Vitamins-Minerals (MULTIVITAMIN PO) Take by mouth.   Yes Historical Provider, MD  ciprofloxacin (CIPRO) 500 MG tablet Take 1 tablet (500 mg total) by mouth 2 (two) times daily. 04/19/15   Frederich Cha, MD   phenazopyridine (PYRIDIUM) 200 MG tablet Take 1 tablet (200 mg total) by mouth 3 (three) times daily as needed for pain. 04/19/15   Frederich Cha, MD   Meds Ordered and Administered this Visit  Medications - No data to display  BP 143/82 mmHg  Pulse 93  Temp(Src) 97.9 F (36.6 C) (Oral)  Resp 16  Ht 5\' 4"  (1.626 m)  Wt 181 lb (82.101 kg)  BMI 31.05 kg/m2  SpO2 100%  LMP 04/15/2015 No data found.   Physical Exam  Constitutional: She is oriented to person, place, and time. She appears well-developed and well-nourished.  HENT:  Head: Normocephalic and atraumatic.  Eyes: Conjunctivae are normal. Pupils are equal, round, and reactive to light.  Abdominal: Soft. Bowel sounds are normal. She exhibits no distension. There is no tenderness. There is no CVA tenderness.  Musculoskeletal: Normal range of motion.  Neurological: She is alert and oriented to person, place, and time.  Skin: Skin is warm and dry. No erythema.  Psychiatric: She has a normal mood and affect. Her behavior is normal.  Vitals reviewed.   ED Course  Procedures (including critical care time)  Labs Review Labs Reviewed  URINALYSIS COMPLETEWITH MICROSCOPIC ([facility] ONLY) - Abnormal; Notable for the following:    APPearance HAZY (*)    Ketones, ur TRACE (*)    Hgb urine dipstick 2+ (*)    Protein, ur 100 (*)    Leukocytes, UA 1+ (*)    Bacteria, UA MANY (*)    Squamous Epithelial / LPF 0-5 (*)    All other components within normal limits  URINE CULTURE    Imaging Review No results found.   Visual Acuity Review  Right Eye Distance:   Left Eye Distance:   Bilateral Distance:    Right Eye Near:   Left Eye Near:    Bilateral Near:        Results for orders placed or performed during the hospital encounter of 04/19/15  Urinalysis complete, with microscopic  Result Value Ref Range   Color, Urine YELLOW YELLOW   APPearance HAZY (A) CLEAR   Glucose, UA NEGATIVE NEGATIVE mg/dL   Bilirubin Urine  NEGATIVE NEGATIVE   Ketones, ur TRACE (A) NEGATIVE mg/dL   Specific Gravity, Urine 1.025 1.005 - 1.030   Hgb urine dipstick 2+ (A) NEGATIVE   pH 7.5 5.0 - 8.0   Protein, ur 100 (A) NEGATIVE mg/dL   Nitrite NEGATIVE NEGATIVE   Leukocytes, UA 1+ (A) NEGATIVE   RBC / HPF 21-50 <3 RBC/hpf   WBC, UA 0-5 <3 WBC/hpf   Bacteria, UA MANY (A) RARE   Squamous Epithelial / LPF 0-5 (A) RARE   Amorphous Crystal PRESENT      MDM   1. UTI (lower urinary tract infection)      Patient to be placed on Pyridium and Cipro. Work note given for today as well  Frederich Cha, MD 04/19/15 [phone]

## 2015-04-22 LAB — URINE CULTURE

## 2015-04-22 NOTE — ED Notes (Signed)
Final report of urine C&S positive for UTI, treatment adequate w Rx provided day of UCC visit 

## 2015-05-26 ENCOUNTER — Encounter: Payer: Self-pay | Admitting: Physician Assistant

## 2015-05-26 ENCOUNTER — Ambulatory Visit: Payer: Self-pay | Admitting: Physician Assistant

## 2015-05-26 VITALS — BP 110/70 | HR 101 | Temp 98.6°F

## 2015-05-26 DIAGNOSIS — J069 Acute upper respiratory infection, unspecified: Secondary | ICD-10-CM

## 2015-05-26 MED ORDER — AZITHROMYCIN 250 MG PO TABS
ORAL_TABLET | ORAL | Status: DC
Start: 2015-05-26 — End: 2015-10-12

## 2015-05-26 NOTE — Progress Notes (Signed)
S: C/o runny nose and congestion for 5 days, no fever, chills, cp/sob, v/d; mucus was green this am, cough is sporadic, husband has same sx  Using otc meds with little relief  O: PE: vitals wnl, nad,  perrl eomi, normocephalic, tms dull, nasal mucosa red and swollen, throat injected, neck supple no lymph, lungs c t a, cv rrr, neuro intact  A:  Acute uri   P: zpack, drink fluids, continue regular meds , use otc meds of choice, return if not improving in 5 days, return earlier if worsening

## 2015-07-22 ENCOUNTER — Encounter: Payer: Self-pay | Admitting: *Deleted

## 2015-10-05 ENCOUNTER — Encounter: Payer: Self-pay | Admitting: General Surgery

## 2015-10-12 ENCOUNTER — Ambulatory Visit (INDEPENDENT_AMBULATORY_CARE_PROVIDER_SITE_OTHER): Payer: Managed Care, Other (non HMO) | Admitting: General Surgery

## 2015-10-12 ENCOUNTER — Encounter: Payer: Self-pay | Admitting: General Surgery

## 2015-10-12 VITALS — BP 124/78 | HR 92 | Resp 12 | Ht 64.0 in | Wt 186.0 lb

## 2015-10-12 DIAGNOSIS — N6019 Diffuse cystic mastopathy of unspecified breast: Secondary | ICD-10-CM

## 2015-10-12 DIAGNOSIS — Z803 Family history of malignant neoplasm of breast: Secondary | ICD-10-CM | POA: Diagnosis not present

## 2015-10-12 NOTE — Progress Notes (Signed)
Patient ID: Beth [name], female   DOB: 14-Apr-1971, 45 y.o.   MRN: [id number]  Chief Complaint  Patient presents with  . Follow-up    mammogram    HPI Beth [name] is a 45 y.o. female who presents for a breast evaluation. The most recent mammogram was done on  10/04/15.  Patient does perform regular self breast checks and gets regular mammograms done.   I have reviewed the history of present illness with the patient.  HPI  Past Medical History  Diagnosis Date  . Graves disease   . Cardiac disease   . Family history of malignant neoplasm of breast   . Hyperthyroidism   . Diffuse cystic mastopathy     Past Surgical History  Procedure Laterality Date  . Orbital reconstruction Left 2008  . Eyelid surgery     . Wisdom tooth extraction    . Dilation and curettage of uterus  2011  . Breast biopsy  2008  . Breast biopsy Left 2/13     aspiration cyst    Family History  Problem Relation Age of Onset  . Breast cancer Maternal Grandmother   . Diabetes Mother   . Hypertension Mother   . Hyperlipidemia Mother   . Stroke Father   . Hypertension Father   . Hyperlipidemia Father   . Hypothyroidism Father   . Hyperlipidemia Sister   . Hypertension Sister   . Hyperlipidemia Sister   . Hypertension Sister   . Diabetes Sister   . Hyperlipidemia Sister   . Hypertension Sister     Social History Social History  Substance Use Topics  . Smoking status: Never Smoker   . Smokeless tobacco: Never Used  . Alcohol Use: Yes     Comment: occasional    No Known Allergies  Current Outpatient Prescriptions  Medication Sig Dispense Refill  . acetaminophen (TYLENOL) 325 MG tablet Take by mouth.    . Calcium Carbonate-Vitamin D 600-200 MG-UNIT TABS Take by mouth.    . CRANBERRY PO Take 2 tablets by mouth 2 (two) times daily.    . Desogestrel-Ethinyl Estradiol (MIRCETTE PO) Take by mouth.    . fish oil-omega-3 fatty acids 1000 MG capsule Take 2 g by mouth daily.    . Garlic Oil 123XX123 MG  CAPS Take by mouth.    . Ibuprofen (ADVIL) 200 MG CAPS Take by mouth as needed.    . Ibuprofen-Diphenhydramine Cit (ADVIL PM PO) Take 1 tablet by mouth as needed.    . Multiple Vitamins-Minerals (MULTIVITAMIN PO) Take by mouth.     No current facility-administered medications for this visit.    Review of Systems Review of Systems  Constitutional: Negative.   Respiratory: Negative.     Blood pressure 124/78, pulse 92, resp. rate 12, height 5\' 4"  (1.626 m), weight 186 lb (84.369 kg), last menstrual period 09/30/2015.  Physical Exam Physical Exam  Constitutional: She is oriented to person, place, and time. She appears well-developed and well-nourished.  HENT:  Mouth/Throat: Oropharynx is clear and moist.  Eyes: Conjunctivae are normal. No scleral icterus.  Neck: Neck supple.  Cardiovascular: Normal rate, regular rhythm and normal heart sounds.   Pulmonary/Chest: Effort normal and breath sounds normal. Right breast exhibits no inverted nipple, no mass, no nipple discharge, no skin change and no tenderness. Left breast exhibits no inverted nipple, no mass, no nipple discharge, no skin change and no tenderness.  Abdominal: Soft. Bowel sounds are normal. There is no tenderness.  Lymphadenopathy:  She has no cervical adenopathy.    She has no axillary adenopathy.  Neurological: She is alert and oriented to person, place, and time.  Skin: Skin is warm and dry.  Psychiatric: Her behavior is normal.    Data Reviewed Mammogram reviewed and stable benign nodule left breast.  Assessment    Stable exam, fibrocystic disease. Remote family history of breast cancer     Plan    The patient has been asked to return to the office in one year with a bilateral screening mammogram     PCP:  Margarita Rana This information has been scribed by Gaspar Cola CMA.   Tyne Banta G 10/12/2015, 9:20 AM

## 2015-10-12 NOTE — Patient Instructions (Addendum)
The patient is aware to call back for any questions or concerns.  The patient has been asked to return to the office in one year with a bilateral screening mammogram 

## 2016-01-18 ENCOUNTER — Encounter: Payer: Self-pay | Admitting: Physician Assistant

## 2016-01-18 ENCOUNTER — Encounter: Payer: Self-pay | Admitting: Family Medicine

## 2016-01-18 ENCOUNTER — Ambulatory Visit (INDEPENDENT_AMBULATORY_CARE_PROVIDER_SITE_OTHER): Payer: Managed Care, Other (non HMO) | Admitting: Physician Assistant

## 2016-01-18 VITALS — BP 130/70 | HR 100 | Temp 98.3°F | Resp 16 | Ht 64.0 in | Wt 189.2 lb

## 2016-01-18 DIAGNOSIS — R739 Hyperglycemia, unspecified: Secondary | ICD-10-CM

## 2016-01-18 DIAGNOSIS — R1310 Dysphagia, unspecified: Secondary | ICD-10-CM

## 2016-01-18 DIAGNOSIS — R5383 Other fatigue: Secondary | ICD-10-CM | POA: Diagnosis not present

## 2016-01-18 DIAGNOSIS — E041 Nontoxic single thyroid nodule: Secondary | ICD-10-CM | POA: Diagnosis not present

## 2016-01-18 DIAGNOSIS — E7800 Pure hypercholesterolemia, unspecified: Secondary | ICD-10-CM | POA: Diagnosis not present

## 2016-01-18 DIAGNOSIS — E0500 Thyrotoxicosis with diffuse goiter without thyrotoxic crisis or storm: Secondary | ICD-10-CM | POA: Diagnosis not present

## 2016-01-18 DIAGNOSIS — Z0000 Encounter for general adult medical examination without abnormal findings: Secondary | ICD-10-CM

## 2016-01-18 DIAGNOSIS — R6889 Other general symptoms and signs: Secondary | ICD-10-CM

## 2016-01-18 NOTE — Progress Notes (Addendum)
Patient: Beth [name], Female    DOB: May 01, 1971, 45 y.o.   MRN: [id number] Visit Date: 01/18/2016  Today's Provider: Mar Daring, PA-C   Chief Complaint  Patient presents with  . Annual Exam   Subjective:    Annual physical exam Beth [name] is a 45 y.o. female who presents today for health maintenance and complete physical. She feels well. She reports exercising -patient reports she is trying to get back. She reports she is sleeping fairly well.She sleeps between 6-7 hours on the weekdays and 7-8 hours on the weekends. She reports that she feels very tired during the day. Patient gets pap smear and mammograms ([place] [facility] schedules mammogram) done through her GYN.  Mammogram:10/04/2015 Tdap:01/31/2008 -----------------------------------------------------------------   Review of Systems  Constitutional: Positive for activity change, chills (Cold easily) and fatigue.  HENT: Negative.   Eyes: Negative.   Respiratory: Negative.   Cardiovascular: Negative.   Gastrointestinal: Negative.   Endocrine: Negative.   Genitourinary: Negative.   Musculoskeletal: Negative.   Skin: Negative.   Allergic/Immunologic: Negative.   Neurological: Negative.   Hematological: Negative.   Psychiatric/Behavioral: Positive for decreased concentration.    Social History      She  reports that she has never smoked. She has never used smokeless tobacco. She reports that she drinks alcohol. She reports that she does not use drugs.       Social History   Social History  . Marital status: Married    Spouse name: N/A  . Number of children: N/A  . Years of education: N/A   Social History Main Topics  . Smoking status: Never Smoker  . Smokeless tobacco: Never Used  . Alcohol use Yes     Comment: occasional  . Drug use: No  . Sexual activity: Not Asked   Other Topics Concern  . None   Social History Narrative  . None    Past Medical History:  Diagnosis Date  .  Cardiac disease   . Diffuse cystic mastopathy   . Family history of malignant neoplasm of breast   . Graves disease   . Hyperthyroidism      Patient Active Problem List   Diagnosis Date Noted  . Allergic rhinitis 10/23/2014  . Graves disease 10/23/2014  . Blood glucose elevated 10/23/2014  . Cannot sleep 10/23/2014  . Family history of breast cancer 09/25/2013  . Diffuse cystic mastopathy 09/25/2013  . 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 Hypercholesteremia 11/17/2008  . Dupuytren's contracture of foot 11/17/2008    Past Surgical History:  Procedure Laterality Date  . BREAST BIOPSY  2008  . BREAST BIOPSY Left 2/13    aspiration cyst  . DILATION AND CURETTAGE OF UTERUS  2011  . eyelid surgery     . ORBITAL RECONSTRUCTION Left 2008  . WISDOM TOOTH EXTRACTION      Family History        Family Status  Relation Status  . Mother Alive  . Father Alive  . Sister Alive  . Brother Alive  . Sister Alive  . Sister Alive  . Maternal Grandmother         Her family history includes Breast cancer in her maternal grandmother; Diabetes in her mother and sister; Hyperlipidemia in her father, mother, sister, sister, and sister; Hypertension in her father, mother, sister, sister, and sister; Hypothyroidism in her father; Stroke in her father.    No Known Allergies  No outpatient prescriptions have been marked as  taking for the 01/18/16 encounter (Office Visit) with Mar Daring, PA-C.    Patient Care Team: Margarita Rana, MD as PCP - General (Family Medicine) Seeplaputhur Robinette Haines, MD (General Surgery) Margarita Rana, MD as Referring Physician (Family Medicine)     Objective:   Vitals: BP 130/70 (BP Location: Left Arm, Patient Position: Sitting, Cuff Size: Normal)   Pulse 100   Temp 98.3 F (36.8 C) (Oral)   Resp 16   Ht 5\' 4"  (1.626 m)   Wt 189 lb 3.2 oz (85.8 kg)   LMP 12/23/2015   BMI 32.48 kg/m    Physical Exam  Constitutional: She is oriented to person,  place, and time. She appears well-developed and well-nourished. No distress.  HENT:  Head: Normocephalic and atraumatic.  Right Ear: External ear normal.  Left Ear: External ear normal.  Nose: Nose normal.  Mouth/Throat: Oropharynx is clear and moist. No oropharyngeal exudate.  Eyes: Conjunctivae and EOM are normal. Pupils are equal, round, and reactive to light. Right eye exhibits no discharge. Left eye exhibits no discharge. No scleral icterus.  Neck: Normal range of motion. Neck supple. No JVD present. No tracheal deviation present. Thyromegaly present.  Cardiovascular: Normal rate, regular rhythm, normal heart sounds and intact distal pulses.  Exam reveals no gallop and no friction rub.   No murmur heard. Pulmonary/Chest: Effort normal and breath sounds normal. No respiratory distress. She has no wheezes. She has no rales. She exhibits no tenderness.  Abdominal: Soft. Bowel sounds are normal. She exhibits no distension and no mass. There is no tenderness. There is no rebound and no guarding.  Musculoskeletal: Normal range of motion. She exhibits no edema or tenderness.  Lymphadenopathy:    She has no cervical adenopathy.  Neurological: She is alert and oriented to person, place, and time.  Skin: Skin is warm and dry. No rash noted. She is not diaphoretic.  Psychiatric: She has a normal mood and affect. Her behavior is normal. Judgment and thought content normal.  Vitals reviewed.  Depression Screen No flowsheet data found.    Assessment & Plan:     Routine Health Maintenance and Physical Exam  Exercise Activities and Dietary recommendations Goals    . Exercise 150 minutes per week (moderate activity)       Immunization History  Administered Date(s) Administered  . Tdap 01/31/2008    Health Maintenance  Topic Date Due  . HIV Screening  09/23/1984  . PAP SMEAR  02/05/2012  . INFLUENZA VACCINE  01/18/2016  . TETANUS/TDAP  01/30/2018      Discussed health benefits  of physical activity, and encouraged her to engage in regular exercise appropriate for her age and condition.   1. Annual physical exam Normal physical exam today. Will check labs as below and f/u pending lab results. If labs are stable and WNL she will not need to have these rechecked for one year at her next annual physical exam. She is to call the office in the meantime if she has any acute issue, questions or concerns. - CBC with Differential/Platelet - Comprehensive metabolic panel  2. Diffuse thyrotoxic goiter Starting tonotice occasional sensation of food getting stuck. Has h/o Grave's disease. Did radioactive iodine treatments. Will check labs as below and f/u pending results. Will get [place] of thyroid to make sure no nodules that could be causing sensation. Will f/u pending results. - TSH - US Soft Tissue Head/Neck; Future  3. Graves disease See above medical treatment plan. - TSH - US  Soft Tissue Head/Neck; Future  4. Hypercholesteremia Will check labs as below and f/u pending results. - Lipid panel  5. Blood glucose elevated Will check labs as below and f/u pending results. - Comprehensive metabolic panel - Hemoglobin A1c  6. Cold intolerance See above medical treatment plan for #2. Will also make sure not anemic. - CBC with Differential/Platelet - TSH - B12 - Vitamin D (25 hydroxy) - Iron - Iron Binding Cap (TIBC)  7. Other fatigue Will check for worsening hypothyroidism or anemia as cause of fatigue. Will f/u pending results. - CBC with Differential/Platelet - TSH - B12 - Vitamin D (25 hydroxy) - Iron - Iron Binding Cap (TIBC)  8. Swallowing difficulty See above medical treatment plan for #2. Will f/u pending results and workup further if it progresses and [place] and thyroid work up is negative. - US Soft Tissue Head/Neck; Future CLINICAL DATA:  History of Grave's disease.  EXAM: THYROID ULTRASOUND  TECHNIQUE: Ultrasound examination of the thyroid gland and  adjacent soft tissues was performed.  COMPARISON:  None.  FINDINGS: Right thyroid lobe  Measurements: 4.7 x 1.4 x 1.1 cm.  Rounded solid nodule in the inferior aspect of the right lobe is relatively hypoechoic compared to adjacent parenchyma and demonstrates some internal punctate echogenic foci as well as larger areas of shadowing macroscopic calcification. The nodule measures 1.0 x 0.9 x 0.9 cm. Small adjacent hypoechoic solid nodule measures 0.7 x 0.6 x 0.5 cm.  Left thyroid lobe  Measurements: 4.8 x 1.1 x 1.3 cm.  No nodules visualized.  Isthmus  Thickness: 0.5 cm.  No nodules visualized.  Lymphadenopathy  None visualized.  IMPRESSION: Normal sized thyroid gland. 1 cm solid nodule in the inferior right lobe demonstrates internal calcifications. Adjacent small hypoechoic nodule measures 0.7 cm in maximum diameter.  Given features of being solid, hypoechoic and containing punctate echogenic foci, biopsy of the 1 cm right thyroid nodule under ultrasound guidance is recommended.   Electronically Signed   By: Aletta Edouard M.D.   On: 01/28/2016 15:27 -------------------------------------------------------------------- The entirety of the information documented in the History of Present Illness, Review of Systems and Physical Exam were personally obtained by me. Portions of this information were initially documented by Ashley Royalty, CMA and reviewed by me for thoroughness and accuracy.   Mar Daring, PA-C  [facility]

## 2016-01-18 NOTE — Patient Instructions (Signed)
Health Maintenance, Female Adopting a healthy lifestyle and getting preventive care can go a long way to promote health and wellness. Talk with your health care provider about what schedule of regular examinations is right for you. This is a good chance for you to check in with your provider about disease prevention and staying healthy. In between checkups, there are plenty of things you can do on your own. Experts have done a lot of research about which lifestyle changes and preventive measures are most likely to keep you healthy. Ask your health care provider for more information. WEIGHT AND DIET  Eat a healthy diet  Be sure to include plenty of vegetables, fruits, low-fat dairy products, and lean protein.  Do not eat a lot of foods high in solid fats, added sugars, or salt.  Get regular exercise. This is one of the most important things you can do for your health.  Most adults should exercise for at least 150 minutes each week. The exercise should increase your heart rate and make you sweat (moderate-intensity exercise).  Most adults should also do strengthening exercises at least twice a week. This is in addition to the moderate-intensity exercise.  Maintain a healthy weight  Body mass index (BMI) is a measurement that can be used to identify possible weight problems. It estimates body fat based on height and weight. Your health care provider can help determine your BMI and help you achieve or maintain a healthy weight.  For females 20 years of age and older:   A BMI below 18.5 is considered underweight.  A BMI of 18.5 to 24.9 is normal.  A BMI of 25 to 29.9 is considered overweight.  A BMI of 30 and above is considered obese.  Watch levels of cholesterol and blood lipids  You should start having your blood tested for lipids and cholesterol at 46 years of age, then have this test every 5 years.  You may need to have your cholesterol levels checked more often if:  Your lipid  or cholesterol levels are high.  You are older than 46 years of age.  You are at high risk for heart disease.  CANCER SCREENING   Lung Cancer  Lung cancer screening is recommended for adults 55-80 years old who are at high risk for lung cancer because of a history of smoking.  A yearly low-dose CT scan of the lungs is recommended for people who:  Currently smoke.  Have quit within the past 15 years.  Have at least a 30-pack-year history of smoking. A pack year is smoking an average of one pack of cigarettes a day for 1 year.  Yearly screening should continue until it has been 15 years since you quit.  Yearly screening should stop if you develop a health problem that would prevent you from having lung cancer treatment.  Breast Cancer  Practice breast self-awareness. This means understanding how your breasts normally appear and feel.  It also means doing regular breast self-exams. Let your health care provider know about any changes, no matter how small.  If you are in your 20s or 30s, you should have a clinical breast exam (CBE) by a health care provider every 1-3 years as part of a regular health exam.  If you are 40 or older, have a CBE every year. Also consider having a breast X-ray (mammogram) every year.  If you have a family history of breast cancer, talk to your health care provider about genetic screening.  If you   are at high risk for breast cancer, talk to your health care provider about having an MRI and a mammogram every year.  Breast cancer gene (BRCA) assessment is recommended for women who have family members with BRCA-related cancers. BRCA-related cancers include:  Breast.  Ovarian.  Tubal.  Peritoneal cancers.  Results of the assessment will determine the need for genetic counseling and BRCA1 and BRCA2 testing. Cervical Cancer Your health care provider may recommend that you be screened regularly for cancer of the pelvic organs (ovaries, uterus, and  vagina). This screening involves a pelvic examination, including checking for microscopic changes to the surface of your cervix (Pap test). You may be encouraged to have this screening done every 3 years, beginning at age 21.  For women ages 30-65, health care providers may recommend pelvic exams and Pap testing every 3 years, or they may recommend the Pap and pelvic exam, combined with testing for human papilloma virus (HPV), every 5 years. Some types of HPV increase your risk of cervical cancer. Testing for HPV may also be done on women of any age with unclear Pap test results.  Other health care providers may not recommend any screening for nonpregnant women who are considered low risk for pelvic cancer and who do not have symptoms. Ask your health care provider if a screening pelvic exam is right for you.  If you have had past treatment for cervical cancer or a condition that could lead to cancer, you need Pap tests and screening for cancer for at least 20 years after your treatment. If Pap tests have been discontinued, your risk factors (such as having a new sexual partner) need to be reassessed to determine if screening should resume. Some women have medical problems that increase the chance of getting cervical cancer. In these cases, your health care provider may recommend more frequent screening and Pap tests. Colorectal Cancer  This type of cancer can be detected and often prevented.  Routine colorectal cancer screening usually begins at 46 years of age and continues through 46 years of age.  Your health care provider may recommend screening at an earlier age if you have risk factors for colon cancer.  Your health care provider may also recommend using home test kits to check for hidden blood in the stool.  A small camera at the end of a tube can be used to examine your colon directly (sigmoidoscopy or colonoscopy). This is done to check for the earliest forms of colorectal  cancer.  Routine screening usually begins at age 50.  Direct examination of the colon should be repeated every 5-10 years through 46 years of age. However, you may need to be screened more often if early forms of precancerous polyps or small growths are found. Skin Cancer  Check your skin from head to toe regularly.  Tell your health care provider about any new moles or changes in moles, especially if there is a change in a mole's shape or color.  Also tell your health care provider if you have a mole that is larger than the size of a pencil eraser.  Always use sunscreen. Apply sunscreen liberally and repeatedly throughout the day.  Protect yourself by wearing long sleeves, pants, a wide-brimmed hat, and sunglasses whenever you are outside. HEART DISEASE, DIABETES, AND HIGH BLOOD PRESSURE   High blood pressure causes heart disease and increases the risk of stroke. High blood pressure is more likely to develop in:  People who have blood pressure in the high end   of the normal range (130-139/85-89 mm Hg).  People who are overweight or obese.  People who are African American.  If you are 38-23 years of age, have your blood pressure checked every 3-5 years. If you are 61 years of age or older, have your blood pressure checked every year. You should have your blood pressure measured twice--once when you are at a hospital or clinic, and once when you are not at a hospital or clinic. Record the average of the two measurements. To check your blood pressure when you are not at a hospital or clinic, you can use:  An automated blood pressure machine at a pharmacy.  A home blood pressure monitor.  If you are between 45 years and 39 years old, ask your health care provider if you should take aspirin to prevent strokes.  Have regular diabetes screenings. This involves taking a blood sample to check your fasting blood sugar level.  If you are at a normal weight and have a low risk for diabetes,  have this test once every three years after 46 years of age.  If you are overweight and have a high risk for diabetes, consider being tested at a younger age or more often. PREVENTING INFECTION  Hepatitis B  If you have a higher risk for hepatitis B, you should be screened for this virus. You are considered at high risk for hepatitis B if:  You were born in a country where hepatitis B is common. Ask your health care provider which countries are considered high risk.  Your parents were born in a high-risk country, and you have not been immunized against hepatitis B (hepatitis B vaccine).  You have HIV or AIDS.  You use needles to inject street drugs.  You live with someone who has hepatitis B.  You have had sex with someone who has hepatitis B.  You get hemodialysis treatment.  You take certain medicines for conditions, including cancer, organ transplantation, and autoimmune conditions. Hepatitis C  Blood testing is recommended for:  Everyone born from [birth year] through 1966.  Anyone with known risk factors for hepatitis C. Sexually transmitted infections (STIs)  You should be screened for sexually transmitted infections (STIs) including gonorrhea and chlamydia if:  You are sexually active and are younger than 46 years of age.  You are older than 46 years of age and your health care provider tells you that you are at risk for this type of infection.  Your sexual activity has changed since you were last screened and you are at an increased risk for chlamydia or gonorrhea. Ask your health care provider if you are at risk.  If you do not have HIV, but are at risk, it may be recommended that you take a prescription medicine daily to prevent HIV infection. This is called pre-exposure prophylaxis (PrEP). You are considered at risk if:  You are sexually active and do not regularly use condoms or know the HIV status of your partner(s).  You take drugs by injection.  You are sexually  active with a partner who has HIV. Talk with your health care provider about whether you are at high risk of being infected with HIV. If you choose to begin PrEP, you should first be tested for HIV. You should then be tested every 3 months for as long as you are taking PrEP.  PREGNANCY   If you are premenopausal and you may become pregnant, ask your health care provider about preconception counseling.  If you may  become pregnant, take 400 to 800 micrograms (mcg) of folic acid every day.  If you want to prevent pregnancy, talk to your health care provider about birth control (contraception). OSTEOPOROSIS AND MENOPAUSE   Osteoporosis is a disease in which the bones lose minerals and strength with aging. This can result in serious bone fractures. Your risk for osteoporosis can be identified using a bone density scan.  If you are 61 years of age or older, or if you are at risk for osteoporosis and fractures, ask your health care provider if you should be screened.  Ask your health care provider whether you should take a calcium or vitamin D supplement to lower your risk for osteoporosis.  Menopause may have certain physical symptoms and risks.  Hormone replacement therapy may reduce some of these symptoms and risks. Talk to your health care provider about whether hormone replacement therapy is right for you.  HOME CARE INSTRUCTIONS   Schedule regular health, dental, and eye exams.  Stay current with your immunizations.   Do not use any tobacco products including cigarettes, chewing tobacco, or electronic cigarettes.  If you are pregnant, do not drink alcohol.  If you are breastfeeding, limit how much and how often you drink alcohol.  Limit alcohol intake to no more than 1 drink per day for nonpregnant women. One drink equals 12 ounces of beer, 5 ounces of wine, or 1 ounces of hard liquor.  Do not use street drugs.  Do not share needles.  Ask your health care provider for help if  you need support or information about quitting drugs.  Tell your health care provider if you often feel depressed.  Tell your health care provider if you have ever been abused or do not feel safe at home.   This information is not intended to replace advice given to you by your health care provider. Make sure you discuss any questions you have with your health care provider.   Document Released: 12/19/2010 Document Revised: 06/26/2014 Document Reviewed: 05/07/2013 Elsevier Interactive Patient Education [employer].

## 2016-01-20 LAB — COMPREHENSIVE METABOLIC PANEL
A/G RATIO: 1.6 (ref 1.2–2.2)
ALBUMIN: 4.3 g/dL (ref 3.5–5.5)
ALK PHOS: 80 IU/L (ref 39–117)
ALT: 24 IU/L (ref 0–32)
AST: 21 IU/L (ref 0–40)
BILIRUBIN TOTAL: 0.3 mg/dL (ref 0.0–1.2)
BUN / CREAT RATIO: 17 (ref 9–23)
BUN: 11 mg/dL (ref 6–24)
CO2: 23 mmol/L (ref 18–29)
CREATININE: 0.64 mg/dL (ref 0.57–1.00)
Calcium: 9.7 mg/dL (ref 8.7–10.2)
Chloride: 100 mmol/L (ref 96–106)
GFR calc Af Amer: 124 mL/min/{1.73_m2} (ref >59)
GFR calc non Af Amer: 107 mL/min/{1.73_m2} (ref >59)
GLOBULIN, TOTAL: 2.7 g/dL (ref 1.5–4.5)
Glucose: 93 mg/dL (ref 65–99)
POTASSIUM: 5 mmol/L (ref 3.5–5.2)
SODIUM: 140 mmol/L (ref 134–144)
Total Protein: 7 g/dL (ref 6.0–8.5)

## 2016-01-20 LAB — LIPID PANEL
CHOL/HDL RATIO: 3.5 ratio (ref 0.0–4.4)
Cholesterol, Total: 248 mg/dL — ABNORMAL HIGH (ref 100–199)
HDL: 71 mg/dL (ref >39)
LDL Calculated: 151 mg/dL — ABNORMAL HIGH (ref 0–99)
Triglycerides: 132 mg/dL (ref 0–149)
VLDL CHOLESTEROL CAL: 26 mg/dL (ref 5–40)

## 2016-01-20 LAB — IRON AND TIBC
Iron Saturation: 17 % (ref 15–55)
Iron: 61 ug/dL (ref 27–159)
TIBC: 357 ug/dL (ref 250–450)
UIBC: 296 ug/dL (ref 131–425)

## 2016-01-20 LAB — CBC WITH DIFFERENTIAL/PLATELET
BASOS: 0 %
Basophils Absolute: 0 10*3/uL (ref 0.0–0.2)
EOS (ABSOLUTE): 0.1 10*3/uL (ref 0.0–0.4)
EOS: 1 %
Hematocrit: 38.3 % (ref 34.0–46.6)
Hemoglobin: 12.6 g/dL (ref 11.1–15.9)
IMMATURE GRANS (ABS): 0 10*3/uL (ref 0.0–0.1)
IMMATURE GRANULOCYTES: 0 %
Lymphocytes Absolute: 2.8 10*3/uL (ref 0.7–3.1)
Lymphs: 23 %
MCH: 29 pg (ref 26.6–33.0)
MCHC: 32.9 g/dL (ref 31.5–35.7)
MCV: 88 fL (ref 79–97)
MONOCYTES: 7 %
Monocytes Absolute: 0.9 10*3/uL (ref 0.1–0.9)
NEUTROS PCT: 69 %
Neutrophils Absolute: 8.7 10*3/uL — ABNORMAL HIGH (ref 1.4–7.0)
PLATELETS: 474 10*3/uL — AB (ref 150–379)
RBC: 4.34 x10E6/uL (ref 3.77–5.28)
RDW: 14.4 % (ref 12.3–15.4)
WBC: 12.5 10*3/uL — AB (ref 3.4–10.8)

## 2016-01-20 LAB — VITAMIN D 25 HYDROXY (VIT D DEFICIENCY, FRACTURES): Vit D, 25-Hydroxy: 38.2 ng/mL (ref 30.0–100.0)

## 2016-01-20 LAB — VITAMIN B12: VITAMIN B 12: 538 pg/mL (ref 211–946)

## 2016-01-20 LAB — HEMOGLOBIN A1C
ESTIMATED AVERAGE GLUCOSE: 108 mg/dL
HEMOGLOBIN A1C: 5.4 % (ref 4.8–5.6)

## 2016-01-20 LAB — TSH: TSH: 0.853 u[IU]/mL (ref 0.450–4.500)

## 2016-01-21 ENCOUNTER — Telehealth: Payer: Self-pay

## 2016-01-21 NOTE — Telephone Encounter (Signed)
-----   Message from Mar Daring, [place] sent at 01/20/2016 11:33 AM EDT ----- Mild elevation of WBC count that could show slight viral infection. Let me know if you have any high fevers as we may need to start antibiotic if it is bacterial instead. Normally will monitor and recheck for return to baseline in 2-3 weeks. Cholesterol is still elevated but has improved. Continue lifestyle modifications and fish oil. Cardiovascular risk is still low thanks to having high good cholesterol also. Will continue to monitor yearly for now. All other labs are WNL and stable.

## 2016-01-21 NOTE — Telephone Encounter (Signed)
Patient advised as directed below. Patient verbalized understanding.  Thanks,  -Nash Bolls 

## 2016-01-28 ENCOUNTER — Telehealth: Payer: Self-pay

## 2016-01-28 ENCOUNTER — Ambulatory Visit
Admission: RE | Admit: 2016-01-28 | Discharge: 2016-01-28 | Disposition: A | Discharge status: Home / Self Care | Payer: Managed Care, Other (non HMO) | Source: Ambulatory Visit | Attending: Physician Assistant | Admitting: Physician Assistant

## 2016-01-28 DIAGNOSIS — E0500 Thyrotoxicosis with diffuse goiter without thyrotoxic crisis or storm: Secondary | ICD-10-CM | POA: Diagnosis present

## 2016-01-28 DIAGNOSIS — R1310 Dysphagia, unspecified: Secondary | ICD-10-CM | POA: Insufficient documentation

## 2016-01-28 DIAGNOSIS — E041 Nontoxic single thyroid nodule: Secondary | ICD-10-CM | POA: Insufficient documentation

## 2016-01-28 NOTE — Telephone Encounter (Signed)
Patient advised as directed below.  Thanks,  -Tiena Manansala 

## 2016-01-28 NOTE — Telephone Encounter (Signed)
-----   Message from Mar Daring, [place] sent at 01/28/2016  4:34 PM EDT ----- There is a nodule in right lobe thyroid gland that they are recommending biopsy. I will refer for thyroid biopsy.

## 2016-01-28 NOTE — Addendum Note (Signed)
Addended by: Mar Daring on: 01/28/2016 05:24 PM   Modules accepted: Orders

## 2016-02-07 ENCOUNTER — Telehealth: Payer: Self-pay | Admitting: Physician Assistant

## 2016-02-07 DIAGNOSIS — D72829 Elevated white blood cell count, unspecified: Secondary | ICD-10-CM

## 2016-02-07 NOTE — Telephone Encounter (Signed)
Pt is requesting a lab slip to have her white blood count rechecked.  CB#[id number]

## 2016-02-07 NOTE — Telephone Encounter (Signed)
Lab slip printed and will be placed up front.

## 2016-02-07 NOTE — Telephone Encounter (Signed)
Patient advised lab slip is ready for pick up.  Thanks, [employer]

## 2016-02-09 LAB — CBC WITH DIFFERENTIAL/PLATELET
BASOS ABS: 0 10*3/uL (ref 0.0–0.2)
BASOS: 0 %
EOS (ABSOLUTE): 0.1 10*3/uL (ref 0.0–0.4)
Eos: 1 %
Hematocrit: 36.6 % (ref 34.0–46.6)
Hemoglobin: 11.7 g/dL (ref 11.1–15.9)
Immature Grans (Abs): 0 10*3/uL (ref 0.0–0.1)
Immature Granulocytes: 0 %
LYMPHS ABS: 2.8 10*3/uL (ref 0.7–3.1)
LYMPHS: 23 %
MCH: 28.4 pg (ref 26.6–33.0)
MCHC: 32 g/dL (ref 31.5–35.7)
MCV: 89 fL (ref 79–97)
MONOS ABS: 0.6 10*3/uL (ref 0.1–0.9)
Monocytes: 5 %
NEUTROS ABS: 8.5 10*3/uL — AB (ref 1.4–7.0)
Neutrophils: 71 %
PLATELETS: 456 10*3/uL — AB (ref 150–379)
RBC: 4.12 x10E6/uL (ref 3.77–5.28)
RDW: 14.6 % (ref 12.3–15.4)
WBC: 12 10*3/uL — ABNORMAL HIGH (ref 3.4–10.8)

## 2016-02-10 ENCOUNTER — Telehealth: Payer: Self-pay

## 2016-02-10 NOTE — Telephone Encounter (Signed)
We will just continue to monitor with next regular labs.

## 2016-02-10 NOTE — Telephone Encounter (Signed)
Lmtcb. Juliannah Ohmann Drozdowski, CMA  

## 2016-02-10 NOTE — Telephone Encounter (Signed)
Pt denies infection sx, but states that her WBC's typically run high. Please advise. Renaldo Fiddler, CMA

## 2016-02-10 NOTE — Telephone Encounter (Signed)
-----   Message from Mar Daring, [place] sent at 02/09/2016  1:11 PM EDT ----- WBC count has dropped some but not much. Make sure still no URI or UTI symptoms, or other infectious symptoms.

## 2016-02-11 NOTE — Telephone Encounter (Signed)
Advised pt of lab results. Pt verbally acknowledges understanding. Janequa Kipnis Drozdowski, CMA   

## 2016-10-10 ENCOUNTER — Encounter: Payer: Self-pay | Admitting: General Surgery

## 2016-10-16 ENCOUNTER — Encounter: Payer: Self-pay | Admitting: General Surgery

## 2016-10-16 ENCOUNTER — Ambulatory Visit (INDEPENDENT_AMBULATORY_CARE_PROVIDER_SITE_OTHER): Payer: Managed Care, Other (non HMO) | Admitting: General Surgery

## 2016-10-16 VITALS — BP 128/76 | HR 74 | Resp 12 | Ht 64.0 in | Wt 180.0 lb

## 2016-10-16 DIAGNOSIS — N6019 Diffuse cystic mastopathy of unspecified breast: Secondary | ICD-10-CM | POA: Diagnosis not present

## 2016-10-16 DIAGNOSIS — Z803 Family history of malignant neoplasm of breast: Secondary | ICD-10-CM

## 2016-10-16 NOTE — Progress Notes (Signed)
Patient ID: Beth [name], female   DOB: [birth year], 46 y.o.   MRN: [id number]  Chief Complaint  Patient presents with  . Follow-up    mammogram    HPI Beth [name] is a 46 y.o. female who presents for a breast evaluation. The most recent mammogram was done on .  Patient does perform regular self breast checks and gets regular mammograms done.     HPI  Past Medical History:  Diagnosis Date  . Cardiac disease   . Diffuse cystic mastopathy   . Family history of malignant neoplasm of breast   . Graves disease   . Hyperthyroidism     Past Surgical History:  Procedure Laterality Date  . BREAST BIOPSY  2008  . BREAST BIOPSY Left 2/13    aspiration cyst  . DILATION AND CURETTAGE OF UTERUS  2011  . eyelid surgery     . ORBITAL RECONSTRUCTION Left 2008  . WISDOM TOOTH EXTRACTION      Family History  Problem Relation Age of Onset  . Diabetes Mother   . Hypertension Mother   . Hyperlipidemia Mother   . Stroke Father   . Hypertension Father   . Hyperlipidemia Father   . Hypothyroidism Father   . Hyperlipidemia Sister   . Hypertension Sister   . Hyperlipidemia Sister   . Hypertension Sister   . Diabetes Sister   . Hyperlipidemia Sister   . Hypertension Sister   . Breast cancer Maternal Grandmother     Social History Social History  Substance Use Topics  . Smoking status: Never Smoker  . Smokeless tobacco: Never Used  . Alcohol use Yes     Comment: occasional    No Known Allergies  Current Outpatient Prescriptions  Medication Sig Dispense Refill  . acetaminophen (TYLENOL) 325 MG tablet Take by mouth.    . Calcium Carbonate-Vitamin D 600-200 MG-UNIT TABS Take by mouth.    . CRANBERRY PO Take 2 tablets by mouth 2 (two) times daily.    . Desogestrel-Ethinyl Estradiol (MIRCETTE PO) Take by mouth.    . fish oil-omega-3 fatty acids 1000 MG capsule Take 2 g by mouth daily.    . Garlic Oil 6213 MG CAPS Take by mouth.    . Ibuprofen (ADVIL) 200 MG CAPS Take by mouth as  needed.    . Ibuprofen-Diphenhydramine Cit (ADVIL PM PO) Take 1 tablet by mouth as needed.    . Multiple Vitamins-Minerals (MULTIVITAMIN PO) Take by mouth.     No current facility-administered medications for this visit.     Review of Systems Review of Systems  Constitutional: Negative.   Respiratory: Negative.   Cardiovascular: Negative.     Blood pressure 128/76, pulse 74, resp. rate 12, height 5\' 4"  (1.626 m), weight 180 lb (81.6 kg).  Physical Exam Physical Exam  Constitutional: She is oriented to person, place, and time. She appears well-developed and well-nourished.  Eyes: Conjunctivae are normal. No scleral icterus.  Neck: Neck supple. No thyromegaly present.  Cardiovascular: Normal rate, regular rhythm and normal heart sounds.   Pulmonary/Chest: Effort normal and breath sounds normal. Right breast exhibits no inverted nipple, no mass, no nipple discharge, no skin change and no tenderness. Left breast exhibits no inverted nipple, no mass, no nipple discharge, no skin change and no tenderness.  Lymphadenopathy:    She has no cervical adenopathy.    She has no axillary adenopathy.  Neurological: She is alert and oriented to person, place, and time.  Skin: Skin is  warm and dry.    Data Reviewed Mammogram and report  Assessment   FCD  Family history of breast cancer- mat GM  Exam stable  Plan     Recommend yearly mammograms and a breast check.  Any new breast issues develop may see Dr. Bary Castilla. Patient agrees.   HPI, Physical Exam, Assessment and Plan have been scribed under the direction and in the presence of Mckinley Jewel, MD.  Verlene Mayer, CMA  I have completed the exam and reviewed the above documentation for accuracy and completeness.  I agree with the above.  Haematologist has been used and any errors in dictation or transcription are unintentional.  Seeplaputhur G. Jamal Collin, M.D., F.A.C.S.          Junie Panning G 10/18/2016, 8:48  AM

## 2016-10-16 NOTE — Patient Instructions (Signed)
Get yearly mammograms and a breast check.

## 2017-01-29 ENCOUNTER — Ambulatory Visit (INDEPENDENT_AMBULATORY_CARE_PROVIDER_SITE_OTHER): Payer: Managed Care, Other (non HMO) | Admitting: Family Medicine

## 2017-01-29 ENCOUNTER — Encounter: Payer: Self-pay | Admitting: Family Medicine

## 2017-01-29 VITALS — BP 118/72 | HR 90 | Temp 98.9°F | Resp 16 | Ht 64.0 in | Wt 174.0 lb

## 2017-01-29 DIAGNOSIS — E041 Nontoxic single thyroid nodule: Secondary | ICD-10-CM | POA: Diagnosis not present

## 2017-01-29 DIAGNOSIS — E0500 Thyrotoxicosis with diffuse goiter without thyrotoxic crisis or storm: Secondary | ICD-10-CM

## 2017-01-29 DIAGNOSIS — Z0000 Encounter for general adult medical examination without abnormal findings: Secondary | ICD-10-CM | POA: Diagnosis not present

## 2017-01-29 DIAGNOSIS — R739 Hyperglycemia, unspecified: Secondary | ICD-10-CM | POA: Diagnosis not present

## 2017-01-29 DIAGNOSIS — E7800 Pure hypercholesterolemia, unspecified: Secondary | ICD-10-CM | POA: Diagnosis not present

## 2017-01-29 DIAGNOSIS — R5383 Other fatigue: Secondary | ICD-10-CM | POA: Insufficient documentation

## 2017-01-29 DIAGNOSIS — N6019 Diffuse cystic mastopathy of unspecified breast: Secondary | ICD-10-CM

## 2017-01-29 DIAGNOSIS — R5382 Chronic fatigue, unspecified: Secondary | ICD-10-CM | POA: Diagnosis not present

## 2017-01-29 NOTE — Assessment & Plan Note (Signed)
No current medications Recheck lipid panel Given fmhx of early MI in father, would consider statin therapy pending results

## 2017-01-29 NOTE — Assessment & Plan Note (Signed)
No symptoms other than fatigue currently Recheck TSH

## 2017-01-29 NOTE — Assessment & Plan Note (Signed)
Recheck a1c. °

## 2017-01-29 NOTE — Assessment & Plan Note (Signed)
Labs wnl last year Recheck CBC, CMP, TSH, lipids, A1c Check Vit D Symptoms somewhat concerning for OSA Referral for sleep study Negative PHQ2

## 2017-01-29 NOTE — Progress Notes (Signed)
Patient: Beth [name], Female    DOB: 1971-05-25, 46 y.o.   MRN: [id number] Visit Date: 01/29/2017  Today's Provider: Lavon Paganini, MD   Chief Complaint  Patient presents with  . Annual Exam   Subjective:    Annual physical exam Beth [name] is a 46 y.o. female who presents today for health maintenance and complete physical. She feels well. She reports exercising not regularly (trying to get back to the gym 2-3 times weekly). Diet is overall "healthy", low carb and low sodium. She reports she is sleeping fairly well. However, she does feel tired all the time.   This has been ongoing for several years and lasts all day.  She knows that she snores, as her husband has filmed it previously.  She denies being told that she has pauses or apnea.  She wakes up a lot during the night but is able to quickly fall back to sleep.  She rarely takes advil PM, but does not have trouble falling asleep.  She sleeps 6.5-7 hours/night during the week and 7.5-8 hrs/night on weekends.  She does wake up with a headache sometimes.  Thyroid nodule was previously noted on thyroid ultrasound in 01/2016.  She saw ENT in [facility] and was reassured and told biopsy was not indicated at this time.  She does have h/o Graves disease.  Mammogram- 10/06/2016. Normal. Pap- patient sees GYN.     Review of Systems  Constitutional: Negative.   HENT: Negative.   Eyes: Negative.   Respiratory: Negative.   Cardiovascular: Negative.   Gastrointestinal: Negative.   Endocrine: Negative.   Genitourinary: Negative.   Musculoskeletal: Negative.   Skin: Negative.   Allergic/Immunologic: Negative.   Neurological: Negative.   Hematological: Negative.   Psychiatric/Behavioral: Negative.      Social History      She  reports that she has never smoked. She has never used smokeless tobacco. She reports that she drinks alcohol. She reports that she does not use drugs.       Social History   Social History  . Marital  status: Married    Spouse name: N/A  . Number of children: N/A  . Years of education: N/A   Social History Main Topics  . Smoking status: Never Smoker  . Smokeless tobacco: Never Used  . Alcohol use Yes     Comment: occasional  . Drug use: No  . Sexual activity: Not Asked   Other Topics Concern  . None   Social History Narrative  . None    Past Medical History:  Diagnosis Date  . Cardiac disease   . Diffuse cystic mastopathy   . Family history of malignant neoplasm of breast   . Graves disease   . Hyperthyroidism      Patient Active Problem List   Diagnosis Date Noted  . Fatigue 01/29/2017  . Right thyroid nodule 01/28/2016  . Allergic rhinitis 10/23/2014  . Graves disease 10/23/2014  . Blood glucose elevated 10/23/2014  . Family history of breast cancer 09/25/2013  . Diffuse cystic mastopathy 09/25/2013  . 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 Hypercholesteremia 11/17/2008  . Dupuytren's contracture of foot 11/17/2008    Past Surgical History:  Procedure Laterality Date  . BREAST BIOPSY  2008  . BREAST BIOPSY Left 2/13    aspiration cyst  . DILATION AND CURETTAGE OF UTERUS  2011  . eyelid surgery     . ORBITAL RECONSTRUCTION Left 2008  . WISDOM TOOTH EXTRACTION  Family History        Family Status  Relation Status  . Mother Alive  . Father Alive  . Sister Alive  . Brother Alive  . Sister Alive  . Sister Alive  . MGM (Not Specified)        Her family history includes Breast cancer in her maternal grandmother; Diabetes in her mother and sister; Hyperlipidemia in her father, mother, sister, sister, and sister; Hypertension in her father, mother, sister, sister, and sister; Hypothyroidism in her father; Stroke in her father.   Heart diease in father in his 63s s/p CABG x4  No Known Allergies   Current Outpatient Prescriptions:  .  acetaminophen (TYLENOL) 325 MG tablet, Take by mouth., Disp: , Rfl:  .  Calcium Carbonate-Vitamin D 600-200  MG-UNIT TABS, Take by mouth., Disp: , Rfl:  .  CRANBERRY PO, Take 2 tablets by mouth 2 (two) times daily., Disp: , Rfl:  .  Desogestrel-Ethinyl Estradiol (MIRCETTE PO), Take by mouth., Disp: , Rfl:  .  fish oil-omega-3 fatty acids 1000 MG capsule, Take 2 g by mouth daily., Disp: , Rfl:  .  Garlic Oil 4098 MG CAPS, Take by mouth., Disp: , Rfl:  .  Ibuprofen (ADVIL) 200 MG CAPS, Take by mouth as needed., Disp: , Rfl:  .  Ibuprofen-Diphenhydramine Cit (ADVIL PM PO), Take 1 tablet by mouth as needed., Disp: , Rfl:  .  Multiple Vitamins-Minerals (MULTIVITAMIN PO), Take by mouth., Disp: , Rfl:    Patient Care Team: Virginia Crews, MD as PCP - General (Family Medicine) Christene Lye, MD (General Surgery) Margarita Rana, MD as Referring Physician (Family Medicine)      Objective:   Vitals: BP 118/72 (BP Location: Right Arm, Patient Position: Sitting, Cuff Size: Normal)   Pulse 90   Temp 98.9 F (37.2 C)   Resp 16   Ht 5\' 4"  (1.626 m)   Wt 174 lb (78.9 kg)   LMP 01/19/2017 (Approximate)   SpO2 98%   BMI 29.87 kg/m    Vitals:   01/29/17 0951  BP: 118/72  Pulse: 90  Resp: 16  Temp: 98.9 F (37.2 C)  SpO2: 98%  Weight: 174 lb (78.9 kg)  Height: 5\' 4"  (1.626 m)     Physical Exam  Constitutional: She is oriented to person, place, and time. She appears well-developed and well-nourished. No distress.  HENT:  Head: Normocephalic and atraumatic.  Right Ear: External ear normal.  Left Ear: External ear normal.  Mouth/Throat: Oropharynx is clear and moist. No oropharyngeal exudate.  Eyes: Pupils are equal, round, and reactive to light. Conjunctivae and EOM are normal. No scleral icterus.  Neck: Neck supple.  Cardiovascular: Normal rate, regular rhythm, normal heart sounds and intact distal pulses.   No murmur heard. Pulmonary/Chest: Effort normal and breath sounds normal. No respiratory distress. She has no wheezes. She has no rales.  Abdominal: Soft. She exhibits no  distension. There is no tenderness. There is no rebound and no guarding.  Musculoskeletal: She exhibits no edema or deformity.  Strength intact throughout upper and lower extremities Sensation intact to light touch  Lymphadenopathy:    She has no cervical adenopathy.  Neurological: She is alert and oriented to person, place, and time. No cranial nerve deficit.  Skin: Skin is warm and dry. No rash noted.  Psychiatric: She has a normal mood and affect. Her behavior is normal.  Vitals reviewed.    Depression Screen PHQ 2/9 Scores 01/29/2017  PHQ - 2  Score 0      Assessment & Plan:     Routine Health Maintenance and Physical Exam  Exercise Activities and Dietary recommendations Goals    . Exercise 150 minutes per week (moderate activity)       Immunization History  Administered Date(s) Administered  . Tdap 01/31/2008    Health Maintenance  Topic Date Due  . HIV Screening  09/23/1984  . PAP SMEAR  02/05/2012  . INFLUENZA VACCINE  01/17/2017  . TETANUS/TDAP  01/30/2018     Discussed health benefits of physical activity, and encouraged her to engage in regular exercise appropriate for her age and condition.     Right thyroid nodule Cleared by ENT per patient  Diffuse thyrotoxic goiter No symptoms other than fatigue currently Recheck TSH  Hypercholesteremia No current medications Recheck lipid panel Given fmhx of early MI in father, would consider statin therapy pending results  Fatigue Labs wnl last year Recheck CBC, CMP, TSH, lipids, A1c Check Vit D Symptoms somewhat concerning for OSA Referral for sleep study Negative PHQ2  Blood glucose elevated Recheck a1c  Diffuse cystic mastopathy Followed by Gen Surg Recent mammogram wnl    Lavon Paganini, MD  [place]

## 2017-01-29 NOTE — Assessment & Plan Note (Signed)
Followed by Gen Surg Recent mammogram wnl

## 2017-01-29 NOTE — Patient Instructions (Signed)

## 2017-01-29 NOTE — Assessment & Plan Note (Signed)
Cleared by ENT per patient

## 2017-01-30 ENCOUNTER — Telehealth: Payer: Self-pay

## 2017-01-30 LAB — COMPREHENSIVE METABOLIC PANEL
ALT: 18 IU/L (ref 0–32)
AST: 14 IU/L (ref 0–40)
Albumin/Globulin Ratio: 1.7 (ref 1.2–2.2)
Albumin: 4.4 g/dL (ref 3.5–5.5)
Alkaline Phosphatase: 83 IU/L (ref 39–117)
BUN/Creatinine Ratio: 22 (ref 9–23)
BUN: 13 mg/dL (ref 6–24)
Bilirubin Total: <0.2 mg/dL (ref 0.0–1.2)
CALCIUM: 9.8 mg/dL (ref 8.7–10.2)
CO2: 22 mmol/L (ref 20–29)
CREATININE: 0.6 mg/dL (ref 0.57–1.00)
Chloride: 104 mmol/L (ref 96–106)
GFR, EST AFRICAN AMERICAN: 126 mL/min/{1.73_m2} (ref >59)
GFR, EST NON AFRICAN AMERICAN: 109 mL/min/{1.73_m2} (ref >59)
GLOBULIN, TOTAL: 2.6 g/dL (ref 1.5–4.5)
Glucose: 95 mg/dL (ref 65–99)
Potassium: 4.7 mmol/L (ref 3.5–5.2)
Sodium: 142 mmol/L (ref 134–144)
TOTAL PROTEIN: 7 g/dL (ref 6.0–8.5)

## 2017-01-30 LAB — LIPID PANEL
CHOL/HDL RATIO: 3.5 ratio (ref 0.0–4.4)
Cholesterol, Total: 228 mg/dL — ABNORMAL HIGH (ref 100–199)
HDL: 66 mg/dL (ref >39)
LDL Calculated: 137 mg/dL — ABNORMAL HIGH (ref 0–99)
Triglycerides: 123 mg/dL (ref 0–149)
VLDL Cholesterol Cal: 25 mg/dL (ref 5–40)

## 2017-01-30 LAB — CBC
HEMATOCRIT: 38.6 % (ref 34.0–46.6)
HEMOGLOBIN: 12.4 g/dL (ref 11.1–15.9)
MCH: 29.2 pg (ref 26.6–33.0)
MCHC: 32.1 g/dL (ref 31.5–35.7)
MCV: 91 fL (ref 79–97)
Platelets: 417 10*3/uL — ABNORMAL HIGH (ref 150–379)
RBC: 4.25 x10E6/uL (ref 3.77–5.28)
RDW: 14.3 % (ref 12.3–15.4)
WBC: 12.7 10*3/uL — AB (ref 3.4–10.8)

## 2017-01-30 LAB — VITAMIN D 25 HYDROXY (VIT D DEFICIENCY, FRACTURES): Vit D, 25-Hydroxy: 51.3 ng/mL (ref 30.0–100.0)

## 2017-01-30 LAB — HEMOGLOBIN A1C
Est. average glucose Bld gHb Est-mCnc: 111 mg/dL
HEMOGLOBIN A1C: 5.5 % (ref 4.8–5.6)

## 2017-01-30 LAB — TSH: TSH: 0.969 u[IU]/mL (ref 0.450–4.500)

## 2017-01-30 NOTE — Telephone Encounter (Signed)
-----   Message from Virginia Crews, MD sent at 01/30/2017 10:22 AM EDT ----- Kidney function, liver function, and electrolytes all normal.  Blood counts are stable (white blood cell count is still elevated, at patient's baseline).  Cholesterol has been getting better for the last 2 years.  Though it is above reference ranges, 10 yr risk of heart disease or stroke is only 0.8%.  Below 5%, a cholesterol medication (statin) is not necessary.  No medications need to be added at this time.  We can recheck next year.  Virginia Crews, MD, MPH [facility] 01/30/2017 10:22 AM

## 2017-01-30 NOTE — Telephone Encounter (Signed)
lmtcb

## 2017-01-31 NOTE — Telephone Encounter (Signed)
Pt  Returning call.  CB#[phone]/MW

## 2017-01-31 NOTE — Telephone Encounter (Signed)
Pt advised and agrees with the treatment plan.

## 2017-02-02 ENCOUNTER — Encounter: Payer: Self-pay | Admitting: Family Medicine

## 2017-02-14 ENCOUNTER — Encounter: Payer: Self-pay | Admitting: Obstetrics & Gynecology

## 2017-02-14 ENCOUNTER — Ambulatory Visit (INDEPENDENT_AMBULATORY_CARE_PROVIDER_SITE_OTHER): Payer: Managed Care, Other (non HMO) | Admitting: Obstetrics & Gynecology

## 2017-02-14 VITALS — BP 120/80 | HR 102 | Ht 64.0 in | Wt 173.0 lb

## 2017-02-14 DIAGNOSIS — Z01419 Encounter for gynecological examination (general) (routine) without abnormal findings: Secondary | ICD-10-CM | POA: Diagnosis not present

## 2017-02-14 DIAGNOSIS — Z0000 Encounter for general adult medical examination without abnormal findings: Secondary | ICD-10-CM

## 2017-02-14 MED ORDER — DESOGESTREL-ETHINYL ESTRADIOL 0.15-0.02/0.01 MG (21/5) PO TABS: 1.0000 | ORAL_TABLET | Freq: Every day | ORAL | 3 refills | Status: DC

## 2017-02-14 NOTE — Progress Notes (Signed)
HPI:      [name] is a 46 y.o. G1P1 who LMP was Patient's last menstrual period was 01/19/2017 (approximate)., she presents today for her annual examination. The patient has no complaints today. The patient is sexually active. Her last pap: approximate date 2016 and was normal and last mammogram: approximate date 2018 and was normal. The patient does perform self breast exams.  There is no notable family history of breast or ovarian cancer in her family.  The patient has regular exercise: yes.  The patient denies current symptoms of depression.    GYN History: Contraception: OCP (estrogen/progesterone)  PMHx: Past Medical History:  Diagnosis Date  . Cardiac disease   . Diffuse cystic mastopathy   . Family history of malignant neoplasm of breast   . Graves disease   . Hyperthyroidism    Past Surgical History:  Procedure Laterality Date  . BREAST BIOPSY  2008  . BREAST BIOPSY Left 2/13    aspiration cyst  . DILATION AND CURETTAGE OF UTERUS  2011  . eyelid surgery     . ORBITAL RECONSTRUCTION Left 2008  . WISDOM TOOTH EXTRACTION     Family History  Problem Relation Age of Onset  . Diabetes Mother   . Hypertension Mother   . Hyperlipidemia Mother   . Stroke Father   . Hypertension Father   . Hyperlipidemia Father   . Hypothyroidism Father   . Hyperlipidemia Sister   . Hypertension Sister   . Hyperlipidemia Sister   . Hypertension Sister   . Diabetes Sister   . Hyperlipidemia Sister   . Hypertension Sister   . Breast cancer Maternal Grandmother    Social History  Substance Use Topics  . Smoking status: Never Smoker  . Smokeless tobacco: Never Used  . Alcohol use Yes     Comment: occasional    Current Outpatient Prescriptions:  .  acetaminophen (TYLENOL) 325 MG tablet, Take by mouth., Disp: , Rfl:  .  Calcium Carbonate-Vitamin D 600-200 MG-UNIT TABS, Take by mouth., Disp: , Rfl:  .  CRANBERRY PO, Take 2 tablets by mouth 2 (two) times daily., Disp: , Rfl:   .  desogestrel-ethinyl estradiol (MIRCETTE) 0.15-0.02/0.01 MG (21/5) tablet, Take 1 tablet by mouth daily., Disp: 3 Package, Rfl: 3 .  fish oil-omega-3 fatty acids 1000 MG capsule, Take 2 g by mouth daily., Disp: , Rfl:  .  Garlic Oil 5093 MG CAPS, Take by mouth., Disp: , Rfl:  .  Ibuprofen (ADVIL) 200 MG CAPS, Take by mouth as needed., Disp: , Rfl:  .  Ibuprofen-Diphenhydramine Cit (ADVIL PM PO), Take 1 tablet by mouth as needed., Disp: , Rfl:  .  Multiple Vitamins-Minerals (MULTIVITAMIN PO), Take by mouth., Disp: , Rfl:  Allergies: Patient has no known allergies.  Review of Systems  Constitutional: Negative for chills, fever and malaise/fatigue.  HENT: Negative for congestion, sinus pain and sore throat.   Eyes: Negative for blurred vision and pain.  Respiratory: Negative for cough and wheezing.   Cardiovascular: Negative for chest pain and leg swelling.  Gastrointestinal: Negative for abdominal pain, constipation, diarrhea, heartburn, nausea and vomiting.  Genitourinary: Negative for dysuria, frequency, hematuria and urgency.  Musculoskeletal: Negative for back pain, joint pain, myalgias and neck pain.  Skin: Negative for itching and rash.  Neurological: Negative for dizziness, tremors and weakness.  Endo/Heme/Allergies: Does not bruise/bleed easily.  Psychiatric/Behavioral: Negative for depression. The patient is not nervous/anxious and does not have insomnia.     Objective:  BP 120/80   Pulse (!) 102   Ht 5\' 4"  (1.626 m)   Wt 173 lb (78.5 kg)   LMP 01/19/2017 (Approximate)   BMI 29.70 kg/m   Filed Weights   02/14/17 0804  Weight: 173 lb (78.5 kg)   Body mass index is 29.7 kg/m. Physical Exam  Constitutional: She is oriented to person, place, and time. She appears well-developed and well-nourished. No distress.  Genitourinary: Rectum normal, vagina normal and uterus normal. Pelvic exam was performed with patient supine. There is no rash or lesion on the right labia. There  is no rash or lesion on the left labia. Vagina exhibits no lesion. No bleeding in the vagina. Right adnexum does not display mass and does not display tenderness. Left adnexum does not display mass and does not display tenderness. Cervix does not exhibit motion tenderness, lesion, friability or polyp.   Uterus is mobile and midaxial. Uterus is not enlarged or exhibiting a mass.  HENT:  Head: Normocephalic and atraumatic. Head is without laceration.  Right Ear: Hearing normal.  Left Ear: Hearing normal.  Nose: No epistaxis.  No foreign bodies.  Mouth/Throat: Uvula is midline, oropharynx is clear and moist and mucous membranes are normal.  Eyes: Pupils are equal, round, and reactive to light.  Neck: Normal range of motion. Neck supple. No thyromegaly present.  Cardiovascular: Normal rate and regular rhythm.  Exam reveals no gallop and no friction rub.   No murmur heard. Pulmonary/Chest: Effort normal and breath sounds normal. No respiratory distress. She has no wheezes. Right breast exhibits no mass, no skin change and no tenderness. Left breast exhibits no mass, no skin change and no tenderness.  Abdominal: Soft. Bowel sounds are normal. She exhibits no distension. There is no tenderness. There is no rebound.  Musculoskeletal: Normal range of motion.  Neurological: She is alert and oriented to person, place, and time. No cranial nerve deficit.  Skin: Skin is warm and dry.  Psychiatric: She has a normal mood and affect. Judgment normal.  Vitals reviewed.   Assessment:  ANNUAL EXAM 1. Annual physical exam      Screening Plan:            1.  Cervical Screening-  Pap smear schedule reviewed with patient  2. Breast screening- Exam annually and mammogram>40 planned   3. Colonoscopy every 10 years, Hemoccult testing - after age 9  4. Labs managed by PCP  5. Counseling for contraception: oral contraceptives (estrogen/progesterone)  Other:  1. Annual physical exam B12 for  fatigue. Seen at PCP Thyroid normal last check    F/U  Return in about 1 year (around 02/14/2018) for Annual.  Barnett Applebaum, MD, [facility], [facility] 02/14/2017  8:29 AM

## 2017-02-14 NOTE — Patient Instructions (Signed)
PAP every three years (due 2019) Mammogram every year Colonoscopy every 10 years after age 47 Labs yearly (with PCP)   Consider B12 pills (or even injections) for energy

## 2017-02-20 ENCOUNTER — Encounter: Payer: Self-pay | Admitting: Family Medicine

## 2017-02-20 ENCOUNTER — Ambulatory Visit (INDEPENDENT_AMBULATORY_CARE_PROVIDER_SITE_OTHER): Payer: Managed Care, Other (non HMO) | Admitting: Family Medicine

## 2017-02-20 VITALS — BP 112/64 | HR 84 | Temp 98.1°F | Resp 16 | Wt 172.0 lb

## 2017-02-20 DIAGNOSIS — N3090 Cystitis, unspecified without hematuria: Secondary | ICD-10-CM

## 2017-02-20 LAB — POCT URINALYSIS DIPSTICK
BILIRUBIN UA: NEGATIVE
Glucose, UA: NEGATIVE
Ketones, UA: NEGATIVE
Nitrite, UA: NEGATIVE
PH UA: 7.5 (ref 5.0–8.0)
SPEC GRAV UA: 1.01 (ref 1.010–1.025)
Urobilinogen, UA: 0.2 E.U./dL

## 2017-02-20 MED ORDER — CEPHALEXIN 500 MG PO CAPS: 500.0000 mg | ORAL_CAPSULE | Freq: Two times a day (BID) | ORAL | 0 refills | Status: DC

## 2017-02-20 NOTE — Patient Instructions (Signed)

## 2017-02-20 NOTE — Progress Notes (Signed)
Patient: Beth [name] Female    DOB: Jan 09, 1971   46 y.o.   MRN: [id number] Visit Date: 02/20/2017  Today's Provider: Lavon Paganini, MD   Chief Complaint  Patient presents with  . Urinary Tract Infection   Subjective:    Urinary Tract Infection   This is a new problem. Episode onset: x 2 days. The problem occurs every urination. The problem has been unchanged. The patient is experiencing no pain. There has been no fever. There is no history of pyelonephritis. Associated symptoms include urgency. Pertinent negatives include no chills, discharge, flank pain, frequency, hematuria, hesitancy, nausea, sweats or vomiting. Associated symptoms comments: Malodorous urine ("ammonia smell"). She has tried nothing for the symptoms. There is no history of catheterization, recurrent UTIs (last UTI in 2016) or a urological procedure.      No Known Allergies   Current Outpatient Prescriptions:  .  acetaminophen (TYLENOL) 325 MG tablet, Take by mouth., Disp: , Rfl:  .  Calcium Carbonate-Vitamin D 600-200 MG-UNIT TABS, Take by mouth., Disp: , Rfl:  .  CRANBERRY PO, Take 2 tablets by mouth 2 (two) times daily., Disp: , Rfl:  .  desogestrel-ethinyl estradiol (MIRCETTE) 0.15-0.02/0.01 MG (21/5) tablet, Take 1 tablet by mouth daily., Disp: 3 Package, Rfl: 3 .  FIBER PO, Take 1 each by mouth daily. Gummy, Disp: , Rfl:  .  fish oil-omega-3 fatty acids 1000 MG capsule, Take 2 g by mouth daily., Disp: , Rfl:  .  Garlic Oil 8657 MG CAPS, Take by mouth., Disp: , Rfl:  .  Ibuprofen (ADVIL) 200 MG CAPS, Take by mouth as needed., Disp: , Rfl:  .  Ibuprofen-Diphenhydramine Cit (ADVIL PM PO), Take 1 tablet by mouth as needed., Disp: , Rfl:  .  Multiple Vitamins-Minerals (MULTIVITAMIN PO), Take by mouth., Disp: , Rfl:   Review of Systems  Constitutional: Negative for activity change, appetite change, chills and fever.  Gastrointestinal: Negative for abdominal pain, nausea and vomiting.  Endocrine: Negative.    Genitourinary: Positive for urgency. Negative for difficulty urinating, flank pain, frequency, hematuria and hesitancy.  Neurological: Negative.     Social History  Substance Use Topics  . Smoking status: Never Smoker  . Smokeless tobacco: Never Used  . Alcohol use Yes     Comment: occasional   Objective:   BP 112/64 (BP Location: Left Arm, Patient Position: Sitting, Cuff Size: Normal)   Pulse 84   Temp 98.1 F (36.7 C) (Oral)   Resp 16   Wt 172 lb (78 kg)   LMP 02/15/2017   BMI 29.52 kg/m  Vitals:   02/20/17 0900  BP: 112/64  Pulse: 84  Resp: 16  Temp: 98.1 F (36.7 C)  TempSrc: Oral  Weight: 172 lb (78 kg)     Physical Exam  Constitutional: She is oriented to person, place, and time. She appears well-developed and well-nourished. No distress.  HENT:  Head: Normocephalic and atraumatic.  Right Ear: External ear normal.  Left Ear: External ear normal.  Nose: Nose normal.  Mouth/Throat: Oropharynx is clear and moist.  Eyes: Conjunctivae are normal. No scleral icterus.  Cardiovascular: Normal rate, regular rhythm, normal heart sounds and intact distal pulses.   No murmur heard. Pulmonary/Chest: Effort normal and breath sounds normal. No respiratory distress. She has no wheezes. She has no rales.  Abdominal: Soft. Bowel sounds are normal. She exhibits no distension. There is no tenderness. There is no rebound and no guarding.  No CVA tenderness  Musculoskeletal: She  exhibits no edema or deformity.  Neurological: She is alert and oriented to person, place, and time.  Skin: Skin is warm. No rash noted.  Psychiatric: She has a normal mood and affect. Her behavior is normal.  Vitals reviewed.     Urinalysis    Component Value Date/Time   BILIRUBINUR Negative 02/20/2017 0915   PROTEINUR Trace 02/20/2017 0915   UROBILINOGEN 0.2 02/20/2017 0915   NITRITE Negative 02/20/2017 0915   LEUKOCYTESUR Moderate (2+) (A) 02/20/2017 0915    Assessment & Plan:          1. Cystitis - uncomplicated - POCT urinalysis dipstick - consistent with UTI - no signs of Pyelonephritis - CULTURE, URINE COMPREHENSIVE - treat with Keflex BID x5 days - return precautions discussed   The entirety of the information documented in the History of Present Illness, Review of Systems and Physical Exam were personally obtained by me. Portions of this information were initially documented by Raquel Sarna Ratchford, CMA and reviewed by me for thoroughness and accuracy.     Lavon Paganini, MD  [facility]

## 2017-02-22 LAB — CULTURE, URINE COMPREHENSIVE

## 2017-02-23 ENCOUNTER — Telehealth: Payer: Self-pay

## 2017-02-23 NOTE — Telephone Encounter (Signed)
-----   Message from Virginia Crews, MD sent at 02/23/2017  8:44 AM EDT ----- Urine culture shows no specific bacteria, only mixed bacteria that live in the urinary tract.  Likely not a true UTI, but suspect patient has already finished antibiotic course.  Virginia Crews, MD, MPH [facility] 02/23/2017 8:44 AM

## 2017-02-23 NOTE — Telephone Encounter (Signed)
Left message advising pt. OK per DPR. 

## 2017-03-06 ENCOUNTER — Other Ambulatory Visit: Payer: Self-pay

## 2017-03-06 NOTE — Progress Notes (Signed)
Called pt for epworth scale; total was 7.

## 2017-04-25 ENCOUNTER — Encounter: Payer: Self-pay | Admitting: Obstetrics & Gynecology

## 2017-04-25 ENCOUNTER — Encounter: Payer: Self-pay | Admitting: Family Medicine

## 2017-10-23 ENCOUNTER — Encounter: Payer: Self-pay | Admitting: Obstetrics & Gynecology

## 2017-10-29 ENCOUNTER — Other Ambulatory Visit: Payer: Self-pay | Admitting: Obstetrics & Gynecology

## 2017-10-29 DIAGNOSIS — R928 Other abnormal and inconclusive findings on diagnostic imaging of breast: Secondary | ICD-10-CM

## 2017-10-29 NOTE — Progress Notes (Signed)
Referral back to Dr Angie Fava office for abnormal Left Mammogram    Has seen in past for other MMG abnormnalities and cyst aspirations\ D/w pt

## 2017-11-28 ENCOUNTER — Encounter: Payer: Self-pay | Admitting: Obstetrics & Gynecology

## 2017-11-29 ENCOUNTER — Encounter: Payer: Self-pay | Admitting: Obstetrics & Gynecology

## 2017-12-13 ENCOUNTER — Ambulatory Visit: Payer: Managed Care, Other (non HMO) | Admitting: General Surgery

## 2017-12-13 ENCOUNTER — Encounter: Payer: Self-pay | Admitting: General Surgery

## 2017-12-13 VITALS — BP 134/72 | HR 75 | Resp 18 | Ht 64.0 in | Wt 177.0 lb

## 2017-12-13 DIAGNOSIS — N6002 Solitary cyst of left breast: Secondary | ICD-10-CM

## 2017-12-13 NOTE — Patient Instructions (Signed)
  Follow up as needed or if symptoms worsen or change. The patient is aware to call back for any questions or new concerns.

## 2017-12-13 NOTE — Progress Notes (Signed)
Patient ID: Beth [name], female   DOB: [birth year], 47 y.o.   MRN: [id number]  Chief Complaint  Patient presents with  . Other    HPI Beth [name] is a 47 y.o. female who presents for a breast evaluation referred by Dr Kenton Kingfisher, former patient of Dr Jamal Collin. The most recent mammogram was done on 10/29/2017 and added views with a left breast ultrasound on 11/28/2017. She can not feel anything different in the breast. Patient does perform regular self breast checks and gets regular mammograms done.    She works for [employer]  HPI  Past Medical History:  Diagnosis Date  . Cardiac disease   . Diffuse cystic mastopathy   . Family history of malignant neoplasm of breast   . Graves disease   . Hyperthyroidism     Past Surgical History:  Procedure Laterality Date  . BREAST BIOPSY  2008  . BREAST BIOPSY Left 2/13    aspiration cyst  . DILATION AND CURETTAGE OF UTERUS  2011  . eyelid surgery     . ORBITAL RECONSTRUCTION Left 2008  . WISDOM TOOTH EXTRACTION      Family History  Problem Relation Age of Onset  . Diabetes Mother   . Hypertension Mother   . Hyperlipidemia Mother   . Stroke Father   . Hypertension Father   . Hyperlipidemia Father   . Hypothyroidism Father   . Hyperlipidemia Sister   . Hypertension Sister   . Hyperlipidemia Sister   . Hypertension Sister   . Diabetes Sister   . Hyperlipidemia Sister   . Hypertension Sister   . Breast cancer Maternal Grandmother     Social History Social History   Tobacco Use  . Smoking status: Never Smoker  . Smokeless tobacco: Never Used  Substance Use Topics  . Alcohol use: Yes    Comment: occasional  . Drug use: No    No Known Allergies  Current Outpatient Medications  Medication Sig Dispense Refill  . acetaminophen (TYLENOL) 325 MG tablet Take by mouth.    . Calcium Carbonate-Vitamin D 600-200 MG-UNIT TABS Take by mouth.    . CRANBERRY PO Take 2 tablets by mouth 2 (two) times daily.    Marland Kitchen  desogestrel-ethinyl estradiol (MIRCETTE) 0.15-0.02/0.01 MG (21/5) tablet Take 1 tablet by mouth daily. 3 Package 3  . FIBER PO Take 1 each by mouth daily. Gummy    . fish oil-omega-3 fatty acids 1000 MG capsule Take 2 g by mouth daily.    . Garlic Oil 7782 MG CAPS Take by mouth.    . Ibuprofen (ADVIL) 200 MG CAPS Take by mouth as needed.    . Ibuprofen-Diphenhydramine Cit (ADVIL PM PO) Take 1 tablet by mouth as needed.    . Multiple Vitamins-Minerals (MULTIVITAMIN PO) Take by mouth.    . vitamin B-12 (CYANOCOBALAMIN) 100 MCG tablet Take 100 mcg by mouth daily.     No current facility-administered medications for this visit.     Review of Systems Review of Systems  Constitutional: Negative.   Respiratory: Negative.   Cardiovascular: Negative.     Blood pressure 134/72, pulse 75, resp. rate 18, height 5\' 4"  (1.626 m), weight 177 lb (80.3 kg), last menstrual period 11/22/2017, SpO2 96 %.  Physical Exam Physical Exam  Constitutional: She is oriented to person, place, and time. She appears well-developed and well-nourished.  HENT:  Mouth/Throat: Oropharynx is clear and moist.  Eyes: Conjunctivae are normal. No scleral icterus.  Neck: Neck supple.  Cardiovascular:  Normal rate, regular rhythm and normal heart sounds.  Pulmonary/Chest: Effort normal and breath sounds normal. Right breast exhibits no inverted nipple, no mass, no nipple discharge, no skin change and no tenderness. Left breast exhibits mass. Left breast exhibits no inverted nipple, no nipple discharge, no skin change and no tenderness.  Left breast cyst 11-12 o'clock     Lymphadenopathy:    She has no cervical adenopathy.    She has no axillary adenopathy.  Neurological: She is alert and oriented to person, place, and time.  Skin: Skin is warm and dry.  Psychiatric: Her behavior is normal.    Data Reviewed Oct 23, 2017 bilateral mammograms and left breast ultrasound from [facility] reviewed.  Large simple cyst in  the upper outer quadrant of the left breast just underlying the areola.  Read-2.  Assessment    Asymptomatic left breast cyst.    Plan  The patient was encouraged to call if she became clinically aware of the area or was experiencing any tenderness.  Otherwise anticipate spontaneous resolution.   Follow up as needed or if symptoms worsen or change. The patient is aware to call back for any questions or new concerns.    HPI, Physical Exam, Assessment and Plan have been scribed under the direction and in the presence of Robert Bellow, MD. Karie Fetch, RN  I have completed the exam and reviewed the above documentation for accuracy and completeness.  I agree with the above.  Haematologist has been used and any errors in dictation or transcription are unintentional.  Hervey Ard, M.D., F.A.C.S.  Forest Gleason Kaelan Amble 12/14/2017, 8:15 PM

## 2017-12-14 DIAGNOSIS — N6002 Solitary cyst of left breast: Secondary | ICD-10-CM | POA: Insufficient documentation

## 2018-01-13 ENCOUNTER — Other Ambulatory Visit: Payer: Self-pay | Admitting: Obstetrics & Gynecology

## 2018-01-31 ENCOUNTER — Encounter: Payer: Managed Care, Other (non HMO) | Admitting: Family Medicine

## 2018-02-12 ENCOUNTER — Other Ambulatory Visit: Payer: Self-pay | Admitting: Obstetrics & Gynecology

## 2018-02-12 ENCOUNTER — Encounter: Payer: Self-pay | Admitting: Obstetrics & Gynecology

## 2018-02-12 MED ORDER — DESOGESTREL-ETHINYL ESTRADIOL 0.15-0.02/0.01 MG (21/5) PO TABS: 1.0000 | ORAL_TABLET | Freq: Every day | ORAL | 0 refills | Status: DC

## 2018-02-15 ENCOUNTER — Ambulatory Visit: Payer: Self-pay | Admitting: Obstetrics & Gynecology

## 2018-02-21 ENCOUNTER — Other Ambulatory Visit (HOSPITAL_COMMUNITY)
Admission: RE | Admit: 2018-02-21 | Discharge: 2018-02-21 | Disposition: A | Discharge status: Home / Self Care | Payer: Managed Care, Other (non HMO) | Source: Ambulatory Visit | Attending: Obstetrics & Gynecology | Admitting: Obstetrics & Gynecology

## 2018-02-21 ENCOUNTER — Ambulatory Visit (INDEPENDENT_AMBULATORY_CARE_PROVIDER_SITE_OTHER): Payer: Managed Care, Other (non HMO) | Admitting: Obstetrics & Gynecology

## 2018-02-21 ENCOUNTER — Encounter: Payer: Self-pay | Admitting: Obstetrics & Gynecology

## 2018-02-21 VITALS — BP 118/80 | Ht 64.0 in | Wt 171.0 lb

## 2018-02-21 DIAGNOSIS — Z1231 Encounter for screening mammogram for malignant neoplasm of breast: Secondary | ICD-10-CM | POA: Diagnosis not present

## 2018-02-21 DIAGNOSIS — Z124 Encounter for screening for malignant neoplasm of cervix: Secondary | ICD-10-CM | POA: Diagnosis not present

## 2018-02-21 DIAGNOSIS — Z1239 Encounter for other screening for malignant neoplasm of breast: Secondary | ICD-10-CM

## 2018-02-21 DIAGNOSIS — Z0000 Encounter for general adult medical examination without abnormal findings: Secondary | ICD-10-CM | POA: Diagnosis not present

## 2018-02-21 MED ORDER — DESOGESTREL-ETHINYL ESTRADIOL 0.15-0.02/0.01 MG (21/5) PO TABS: 1.0000 | ORAL_TABLET | Freq: Every day | ORAL | 12 refills | Status: DC

## 2018-02-21 NOTE — Progress Notes (Signed)
HPI:      [name] is a 47 y.o. G1P1 who LMP was Patient's last menstrual period was 02/14/2018., she presents today for her annual examination. The patient has no complaints today. The patient is sexually active. Her last pap: approximate date 2016 and was normal and last mammogram: approximate date 10/2017 and was normal. She had follow up imaging and sees Dr Beth [name] for ongoing breast health care.  She had a breast cyst found this past year, no ongoing pain from this. The patient does perform self breast exams.  There is notable family history of breast or ovarian cancer in her family.  The patient has regular exercise: yes.  The patient denies current symptoms of depression.    GYN History: Contraception: OCP (estrogen/progesterone), desires to continue, periods reg and no assoc sx's  PMHx: Past Medical History:  Diagnosis Date  . Cardiac disease   . Diffuse cystic mastopathy   . Family history of malignant neoplasm of breast   . Graves disease   . Hyperthyroidism    Past Surgical History:  Procedure Laterality Date  . BREAST BIOPSY  2008  . BREAST BIOPSY Left 2/13    aspiration cyst  . DILATION AND CURETTAGE OF UTERUS  2011  . eyelid surgery     . ORBITAL RECONSTRUCTION Left 2008  . WISDOM TOOTH EXTRACTION     Family History  Problem Relation Age of Onset  . Diabetes Mother   . Hypertension Mother   . Hyperlipidemia Mother   . Stroke Father   . Hypertension Father   . Hyperlipidemia Father   . Hypothyroidism Father   . Hyperlipidemia Sister   . Hypertension Sister   . Hyperlipidemia Sister   . Hypertension Sister   . Diabetes Sister   . Hyperlipidemia Sister   . Hypertension Sister   . Breast cancer Maternal Grandmother    Social History   Tobacco Use  . Smoking status: Never Smoker  . Smokeless tobacco: Never Used  Substance Use Topics  . Alcohol use: Yes    Comment: occasional  . Drug use: No    Current Outpatient Medications:  .   acetaminophen (TYLENOL) 325 MG tablet, Take by mouth., Disp: , Rfl:  .  Calcium Carbonate-Vitamin D 600-200 MG-UNIT TABS, Take by mouth., Disp: , Rfl:  .  CRANBERRY PO, Take 2 tablets by mouth 2 (two) times daily., Disp: , Rfl:  .  FIBER PO, Take 1 each by mouth daily. Gummy, Disp: , Rfl:  .  fish oil-omega-3 fatty acids 1000 MG capsule, Take 2 g by mouth daily., Disp: , Rfl:  .  Garlic Oil 7741 MG CAPS, Take by mouth., Disp: , Rfl:  .  Ibuprofen (ADVIL) 200 MG CAPS, Take by mouth as needed., Disp: , Rfl:  .  Ibuprofen-Diphenhydramine Cit (ADVIL PM PO), Take 1 tablet by mouth as needed., Disp: , Rfl:  .  Multiple Vitamins-Minerals (MULTIVITAMIN PO), Take by mouth., Disp: , Rfl:  .  vitamin B-12 (CYANOCOBALAMIN) 100 MCG tablet, Take 100 mcg by mouth daily., Disp: , Rfl:  .  desogestrel-ethinyl estradiol (KARIVA) 0.15-0.02/0.01 MG (21/5) tablet, Take 1 tablet by mouth daily., Disp: 28 tablet, Rfl: 12 Allergies: Patient has no known allergies.  Review of Systems  Constitutional: Negative for chills, fever and malaise/fatigue.  HENT: Negative for congestion, sinus pain and sore throat.   Eyes: Negative for blurred vision and pain.  Respiratory: Negative for cough and wheezing.   Cardiovascular: Negative for chest pain  and leg swelling.  Gastrointestinal: Negative for abdominal pain, constipation, diarrhea, heartburn, nausea and vomiting.  Genitourinary: Negative for dysuria, frequency, hematuria and urgency.  Musculoskeletal: Negative for back pain, joint pain, myalgias and neck pain.  Skin: Negative for itching and rash.  Neurological: Negative for dizziness, tremors and weakness.  Endo/Heme/Allergies: Does not bruise/bleed easily.  Psychiatric/Behavioral: Negative for depression. The patient is not nervous/anxious and does not have insomnia.     Objective: BP 118/80   Ht 5\' 4"  (1.626 m)   Wt 171 lb (77.6 kg)   LMP 02/14/2018   BMI 29.35 kg/m   Filed Weights   02/21/18 0805  Weight:  171 lb (77.6 kg)   Body mass index is 29.35 kg/m. Physical Exam  Constitutional: She is oriented to person, place, and time. She appears well-developed and well-nourished. No distress.  Genitourinary: Rectum normal, vagina normal and uterus normal. Pelvic exam was performed with patient supine. There is no rash or lesion on the right labia. There is no rash or lesion on the left labia. Vagina exhibits no lesion. No bleeding in the vagina. Right adnexum does not display mass and does not display tenderness. Left adnexum does not display mass and does not display tenderness. Cervix does not exhibit motion tenderness, lesion, friability or polyp.   Uterus is mobile and midaxial. Uterus is not enlarged or exhibiting a mass.  HENT:  Head: Normocephalic and atraumatic. Head is without laceration.  Right Ear: Hearing normal.  Left Ear: Hearing normal.  Nose: No epistaxis.  No foreign bodies.  Mouth/Throat: Uvula is midline, oropharynx is clear and moist and mucous membranes are normal.  Eyes: Pupils are equal, round, and reactive to light.  Neck: Normal range of motion. Neck supple. No thyromegaly present.  Cardiovascular: Normal rate and regular rhythm. Exam reveals no gallop and no friction rub.  No murmur heard. Pulmonary/Chest: Effort normal and breath sounds normal. No respiratory distress. She has no wheezes. Right breast exhibits no mass, no skin change and no tenderness. Left breast exhibits no mass, no skin change and no tenderness.  Abdominal: Soft. Bowel sounds are normal. She exhibits no distension. There is no tenderness. There is no rebound.  Musculoskeletal: Normal range of motion.  Neurological: She is alert and oriented to person, place, and time. No cranial nerve deficit.  Skin: Skin is warm and dry.  Psychiatric: She has a normal mood and affect. Judgment normal.  Vitals reviewed.  Assessment:  ANNUAL EXAM 1. Annual physical exam   2. Screening for breast cancer   3.  Screening for cervical cancer    Screening Plan:            1.  Cervical Screening-  Pap smear done today  2. Breast screening- Exam annually and mammogram>40 planned   3. Colonoscopy every 10 years, Hemoccult testing - after age 23  4. Labs managed by PCP  5. Counseling for contraception: oral contraceptives (estrogen/progesterone)   6. Flu shot to be done at work    F/U  Return in about 1 year (around 02/22/2019) for Annual.  Barnett Applebaum, MD, [facility], [facility] 02/21/2018  8:29 AM

## 2018-02-21 NOTE — Patient Instructions (Signed)
PAP every three years Mammogram every year Colonoscopy every 10 years after age 48 Labs yearly (with PCP)

## 2018-02-22 LAB — CYTOLOGY - PAP
Diagnosis: NEGATIVE
HPV: NOT DETECTED

## 2018-04-24 ENCOUNTER — Encounter: Payer: Self-pay | Admitting: Family Medicine

## 2018-04-24 ENCOUNTER — Ambulatory Visit (INDEPENDENT_AMBULATORY_CARE_PROVIDER_SITE_OTHER): Payer: Managed Care, Other (non HMO) | Admitting: Family Medicine

## 2018-04-24 VITALS — BP 122/82 | HR 92 | Temp 98.5°F | Wt 164.4 lb

## 2018-04-24 DIAGNOSIS — E663 Overweight: Secondary | ICD-10-CM | POA: Insufficient documentation

## 2018-04-24 DIAGNOSIS — E7800 Pure hypercholesterolemia, unspecified: Secondary | ICD-10-CM | POA: Diagnosis not present

## 2018-04-24 DIAGNOSIS — E0500 Thyrotoxicosis with diffuse goiter without thyrotoxic crisis or storm: Secondary | ICD-10-CM

## 2018-04-24 DIAGNOSIS — Z23 Encounter for immunization: Secondary | ICD-10-CM | POA: Diagnosis not present

## 2018-04-24 DIAGNOSIS — R739 Hyperglycemia, unspecified: Secondary | ICD-10-CM | POA: Diagnosis not present

## 2018-04-24 DIAGNOSIS — Z0000 Encounter for general adult medical examination without abnormal findings: Secondary | ICD-10-CM | POA: Diagnosis not present

## 2018-04-24 DIAGNOSIS — E669 Obesity, unspecified: Secondary | ICD-10-CM | POA: Insufficient documentation

## 2018-04-24 NOTE — Patient Instructions (Signed)
Preventive Care 40-64 Years, Female Preventive care refers to lifestyle choices and visits with your health care provider that can promote health and wellness. What does preventive care include?  A yearly physical exam. This is also called an annual well check.  Dental exams once or twice a year.  Routine eye exams. Ask your health care provider how often you should have your eyes checked.  Personal lifestyle choices, including: ? Daily care of your teeth and gums. ? Regular physical activity. ? Eating a healthy diet. ? Avoiding tobacco and drug use. ? Limiting alcohol use. ? Practicing safe sex. ? Taking low-dose aspirin daily starting at age 58. ? Taking vitamin and mineral supplements as recommended by your health care provider. What happens during an annual well check? The services and screenings done by your health care provider during your annual well check will depend on your age, overall health, lifestyle risk factors, and family history of disease. Counseling Your health care provider may ask you questions about your:  Alcohol use.  Tobacco use.  Drug use.  Emotional well-being.  Home and relationship well-being.  Sexual activity.  Eating habits.  Work and work Statistician.  Method of birth control.  Menstrual cycle.  Pregnancy history.  Screening You may have the following tests or measurements:  Height, weight, and BMI.  Blood pressure.  Lipid and cholesterol levels. These may be checked every 5 years, or more frequently if you are over 81 years old.  Skin check.  Lung cancer screening. You may have this screening every year starting at age 78 if you have a 30-pack-year history of smoking and currently smoke or have quit within the past 15 years.  Fecal occult blood test (FOBT) of the stool. You may have this test every year starting at age 65.  Flexible sigmoidoscopy or colonoscopy. You may have a sigmoidoscopy every 5 years or a colonoscopy  every 10 years starting at age 30.  Hepatitis C blood test.  Hepatitis B blood test.  Sexually transmitted disease (STD) testing.  Diabetes screening. This is done by checking your blood sugar (glucose) after you have not eaten for a while (fasting). You may have this done every 1-3 years.  Mammogram. This may be done every 1-2 years. Talk to your health care provider about when you should start having regular mammograms. This may depend on whether you have a family history of breast cancer.  BRCA-related cancer screening. This may be done if you have a family history of breast, ovarian, tubal, or peritoneal cancers.  Pelvic exam and Pap test. This may be done every 3 years starting at age 80. Starting at age 36, this may be done every 5 years if you have a Pap test in combination with an HPV test.  Bone density scan. This is done to screen for osteoporosis. You may have this scan if you are at high risk for osteoporosis.  Discuss your test results, treatment options, and if necessary, the need for more tests with your health care provider. Vaccines Your health care provider may recommend certain vaccines, such as:  Influenza vaccine. This is recommended every year.  Tetanus, diphtheria, and acellular pertussis (Tdap, Td) vaccine. You may need a Td booster every 10 years.  Varicella vaccine. You may need this if you have not been vaccinated.  Zoster vaccine. You may need this after age 5.  Measles, mumps, and rubella (MMR) vaccine. You may need at least one dose of MMR if you were born in  1957 or later. You may also need a second dose.  Pneumococcal 13-valent conjugate (PCV13) vaccine. You may need this if you have certain conditions and were not previously vaccinated.  Pneumococcal polysaccharide (PPSV23) vaccine. You may need one or two doses if you smoke cigarettes or if you have certain conditions.  Meningococcal vaccine. You may need this if you have certain  conditions.  Hepatitis A vaccine. You may need this if you have certain conditions or if you travel or work in places where you may be exposed to hepatitis A.  Hepatitis B vaccine. You may need this if you have certain conditions or if you travel or work in places where you may be exposed to hepatitis B.  Haemophilus influenzae type b (Hib) vaccine. You may need this if you have certain conditions.  Talk to your health care provider about which screenings and vaccines you need and how often you need them. This information is not intended to replace advice given to you by your health care provider. Make sure you discuss any questions you have with your health care provider. Document Released: 07/02/2015 Document Revised: 02/23/2016 Document Reviewed: 04/06/2015 Elsevier Interactive Patient Education  2018 Elsevier Inc.  

## 2018-04-24 NOTE — Progress Notes (Signed)
Patient: Beth [name], Female    DOB: [birth year], 47 y.o.   MRN: [id number] Visit Date: 04/24/2018  Today's Provider: Lavon Paganini, MD   Chief Complaint  Patient presents with  . Annual Exam   Subjective:    Annual physical exam Beth [name] is a 47 y.o. female who presents today for health maintenance and complete physical. She feels well. She reports exercising none. She reports she is sleeping well.  She is working on [employer] loss with [employer] and plans to start exercising more  UTD on breast and cervical cancer screening - sees GYN No family history of colon cancer - will start screening at age 21 -----------------------------------------------------------------   Review of Systems  Constitutional: Negative.   HENT: Positive for drooling.   Eyes: Negative.   Respiratory: Negative.   Cardiovascular: Negative.   Gastrointestinal: Negative.   Endocrine: Negative.   Genitourinary: Negative.   Musculoskeletal: Negative.   Skin: Negative.   Allergic/Immunologic: Negative.   Neurological: Negative.   Hematological: Negative.   Psychiatric/Behavioral: Negative.     Social History She  reports that she has never smoked. She has never used smokeless tobacco. She reports that she drinks alcohol. She reports that she does not use drugs. Social History   Socioeconomic History  . Marital status: Married    Spouse name: Not on file  . Number of children: Not on file  . Years of education: Not on file  . Highest education level: Not on file  Occupational History  . Not on file  Social Needs  . Financial resource strain: Not on file  . Food insecurity:    Worry: Not on file    Inability: Not on file  . Transportation needs:    Medical: Not on file    Non-medical: Not on file  Tobacco Use  . Smoking status: Never Smoker  . Smokeless tobacco: Never Used  Substance and Sexual Activity  . Alcohol use: Yes    Comment: occasional  . Drug use: No  .  Sexual activity: Yes    Birth control/protection: Pill  Lifestyle  . Physical activity:    Days per week: Not on file    Minutes per session: Not on file  . Stress: Not on file  Relationships  . Social connections:    Talks on phone: Not on file    Gets together: Not on file    Attends religious service: Not on file    Active member of club or organization: Not on file    Attends meetings of clubs or organizations: Not on file    Relationship status: Not on file  Other Topics Concern  . Not on file  Social History Narrative  . Not on file    Patient Active Problem List   Diagnosis Date Noted  . Overweight 04/24/2018  . Breast cyst, left 12/14/2017  . Fatigue 01/29/2017  . Right thyroid nodule 01/28/2016  . Allergic rhinitis 10/23/2014  . Graves disease 10/23/2014  . Blood glucose elevated 10/23/2014  . Family history of breast cancer 09/25/2013  . Diffuse cystic mastopathy 09/25/2013  . 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 Hypercholesteremia 11/17/2008  . Dupuytren's contracture of foot 11/17/2008    Past Surgical History:  Procedure Laterality Date  . BREAST BIOPSY  2008  . BREAST BIOPSY Left 2/13    aspiration cyst  . DILATION AND CURETTAGE OF UTERUS  2011  . eyelid surgery     . ORBITAL  RECONSTRUCTION Left 2008  . WISDOM TOOTH EXTRACTION      Family History  Family Status  Relation Name Status  . Mother  Alive  . Father  Alive  . Sister  Alive  . Brother  Alive  . Sister  Alive  . Sister  Alive  . MGM  (Not Specified)   Her family history includes Breast cancer in her maternal grandmother; Diabetes in her mother and sister; Hyperlipidemia in her father, mother, sister, sister, and sister; Hypertension in her father, mother, sister, sister, and sister; Hypothyroidism in her father; Prostate cancer in her father; Stroke in her father.     No Known Allergies  Previous Medications   ACETAMINOPHEN (TYLENOL) 325 MG TABLET    Take by mouth.   CALCIUM  CARBONATE-VITAMIN D 600-200 MG-UNIT TABS    Take by mouth.   CRANBERRY PO    Take 2 tablets by mouth 2 (two) times daily.   DESOGESTREL-ETHINYL ESTRADIOL (KARIVA) 0.15-0.02/0.01 MG (21/5) TABLET    Take 1 tablet by mouth daily.   FIBER PO    Take 1 each by mouth daily. Gummy   FISH OIL-OMEGA-3 FATTY ACIDS 1000 MG CAPSULE    Take 2 g by mouth daily.   GARLIC OIL 2035 MG CAPS    Take by mouth.   IBUPROFEN (ADVIL) 200 MG CAPS    Take by mouth as needed.   IBUPROFEN-DIPHENHYDRAMINE CIT (ADVIL PM PO)    Take 1 tablet by mouth as needed.   MULTIPLE VITAMINS-MINERALS (MULTIVITAMIN PO)    Take by mouth.   VITAMIN B-12 (CYANOCOBALAMIN) 100 MCG TABLET    Take 100 mcg by mouth daily.    Patient Care Team: Virginia Crews, MD as PCP - General (Family Medicine) Christene Lye, MD (General Surgery) Gae Dry, MD as Referring Physician (Obstetrics and Gynecology)      Objective:   Vitals: BP 122/82   Pulse 92   Temp 98.5 F (36.9 C)   Wt 164 lb 6.4 oz (74.6 kg)   SpO2 99%   BMI 28.22 kg/m    Physical Exam  Constitutional: She is oriented to person, place, and time. She appears well-developed and well-nourished. No distress.  HENT:  Head: Normocephalic and atraumatic.  Right Ear: External ear normal.  Left Ear: External ear normal.  Nose: Nose normal.  Mouth/Throat: Oropharynx is clear and moist.  Eyes: Pupils are equal, round, and reactive to light. Conjunctivae and EOM are normal. No scleral icterus.  Neck: Neck supple. No thyromegaly present.  Cardiovascular: Normal rate, regular rhythm, normal heart sounds and intact distal pulses.  No murmur heard. Pulmonary/Chest: Effort normal and breath sounds normal. No respiratory distress. She has no wheezes. She has no rales.  Abdominal: Soft. Bowel sounds are normal. She exhibits no distension. There is no tenderness. There is no rebound and no guarding.  Musculoskeletal: She exhibits no edema or deformity.    Lymphadenopathy:    She has no cervical adenopathy.  Neurological: She is alert and oriented to person, place, and time.  Skin: Skin is warm and dry. Capillary refill takes less than 2 seconds. No rash noted.  Psychiatric: She has a normal mood and affect. Her behavior is normal.  Vitals reviewed.    Depression Screen PHQ 2/9 Scores 04/24/2018 01/29/2017  PHQ - 2 Score 0 0    Assessment & Plan:     Routine Health Maintenance and Physical Exam  Exercise Activities and Dietary recommendations Goals    .  Exercise 150 minutes per week (moderate activity)       Immunization History  Administered Date(s) Administered  . Influenza-Unspecified 04/05/2017  . Tdap 01/31/2008    Health Maintenance  Topic Date Due  . HIV Screening  09/23/1984  . TETANUS/TDAP  01/30/2018  . PAP SMEAR  02/21/2021  . INFLUENZA VACCINE  Completed     Discussed health benefits of physical activity, and encouraged her to engage in regular exercise appropriate for her age and condition.    --------------------------------------------------------------------  Problem List Items Addressed This Visit      Endocrine   Diffuse thyrotoxic goiter   Relevant Orders   TSH     Other   Hypercholesteremia   Relevant Orders   Comprehensive metabolic panel   Lipid panel   Blood glucose elevated   Relevant Orders   Hemoglobin A1c   Overweight   Relevant Orders   CBC with Differential/Platelet   Comprehensive metabolic panel   Hemoglobin A1c   Lipid panel    Other Visit Diagnoses    Encounter for annual physical exam    -  Primary   Relevant Orders   CBC with Differential/Platelet   Comprehensive metabolic panel   Hemoglobin A1c   Lipid panel   TSH       Return in about 1 year (around 04/25/2019) for CPE.   The entirety of the information documented in the History of Present Illness, Review of Systems and Physical Exam were personally obtained by me. Portions of this information were  initially documented by Rayna Sexton, CMA and reviewed by me for thoroughness and accuracy.    Virginia Crews, MD, MPH [facility] 04/24/2018 2:36 PM

## 2018-04-26 LAB — COMPREHENSIVE METABOLIC PANEL
ALK PHOS: 76 IU/L (ref 39–117)
ALT: 24 IU/L (ref 0–32)
AST: 15 IU/L (ref 0–40)
Albumin/Globulin Ratio: 1.7 (ref 1.2–2.2)
Albumin: 4.2 g/dL (ref 3.5–5.5)
BUN/Creatinine Ratio: 23 (ref 9–23)
BUN: 13 mg/dL (ref 6–24)
Bilirubin Total: 0.3 mg/dL (ref 0.0–1.2)
CALCIUM: 9.5 mg/dL (ref 8.7–10.2)
CO2: 19 mmol/L — AB (ref 20–29)
CREATININE: 0.56 mg/dL — AB (ref 0.57–1.00)
Chloride: 103 mmol/L (ref 96–106)
GFR calc Af Amer: 128 mL/min/{1.73_m2} (ref >59)
GFR, EST NON AFRICAN AMERICAN: 111 mL/min/{1.73_m2} (ref >59)
GLOBULIN, TOTAL: 2.5 g/dL (ref 1.5–4.5)
Glucose: 88 mg/dL (ref 65–99)
POTASSIUM: 4.5 mmol/L (ref 3.5–5.2)
SODIUM: 139 mmol/L (ref 134–144)
Total Protein: 6.7 g/dL (ref 6.0–8.5)

## 2018-04-26 LAB — CBC WITH DIFFERENTIAL/PLATELET
BASOS ABS: 0 10*3/uL (ref 0.0–0.2)
Basos: 0 %
EOS (ABSOLUTE): 0.1 10*3/uL (ref 0.0–0.4)
Eos: 1 %
HEMATOCRIT: 38.5 % (ref 34.0–46.6)
Hemoglobin: 12.7 g/dL (ref 11.1–15.9)
IMMATURE GRANULOCYTES: 0 %
Immature Grans (Abs): 0 10*3/uL (ref 0.0–0.1)
LYMPHS ABS: 2.6 10*3/uL (ref 0.7–3.1)
Lymphs: 23 %
MCH: 29.1 pg (ref 26.6–33.0)
MCHC: 33 g/dL (ref 31.5–35.7)
MCV: 88 fL (ref 79–97)
MONOS ABS: 0.7 10*3/uL (ref 0.1–0.9)
Monocytes: 6 %
NEUTROS PCT: 70 %
Neutrophils Absolute: 7.8 10*3/uL — ABNORMAL HIGH (ref 1.4–7.0)
PLATELETS: 425 10*3/uL (ref 150–450)
RBC: 4.36 x10E6/uL (ref 3.77–5.28)
RDW: 13.1 % (ref 12.3–15.4)
WBC: 11.2 10*3/uL — AB (ref 3.4–10.8)

## 2018-04-26 LAB — LIPID PANEL
CHOLESTEROL TOTAL: 214 mg/dL — AB (ref 100–199)
Chol/HDL Ratio: 3.3 ratio (ref 0.0–4.4)
HDL: 64 mg/dL (ref >39)
LDL Calculated: 129 mg/dL — ABNORMAL HIGH (ref 0–99)
Triglycerides: 106 mg/dL (ref 0–149)
VLDL Cholesterol Cal: 21 mg/dL (ref 5–40)

## 2018-04-26 LAB — TSH: TSH: 1.2 u[IU]/mL (ref 0.450–4.500)

## 2018-04-26 LAB — HEMOGLOBIN A1C
ESTIMATED AVERAGE GLUCOSE: 111 mg/dL
HEMOGLOBIN A1C: 5.5 % (ref 4.8–5.6)

## 2019-01-23 ENCOUNTER — Encounter: Payer: Self-pay | Admitting: Obstetrics & Gynecology

## 2019-01-27 ENCOUNTER — Other Ambulatory Visit: Payer: Self-pay | Admitting: Obstetrics & Gynecology

## 2019-01-27 NOTE — Progress Notes (Signed)
Phone call w pt today Results of MMG reviewed from [facility] The results of her recent mammogram reveals an area that needs further magnification views on the LEFT to determine if there is any concern or if it is just a false alarm. There is no suggestion of cancer, just a need for further images and evaluation by the radiologist. They should be contacting her, if not already, to schedule these additional mammogram pictures.I know this seems worrisome, yet usually additional xrays clear up any suspicion for breast cancer. Has seen Drs Jamal Collin and Bary Castilla in past for breast related issues.  Barnett Applebaum, MD, [facility], [facility] 01/27/2019  7:59 AM

## 2019-02-03 ENCOUNTER — Other Ambulatory Visit: Payer: Self-pay

## 2019-02-03 ENCOUNTER — Ambulatory Visit
Admission: EM | Admit: 2019-02-03 | Discharge: 2019-02-03 | Disposition: A | Discharge status: Home / Self Care | Payer: Managed Care, Other (non HMO) | Attending: Family Medicine | Admitting: Family Medicine

## 2019-02-03 DIAGNOSIS — B9689 Other specified bacterial agents as the cause of diseases classified elsewhere: Secondary | ICD-10-CM

## 2019-02-03 DIAGNOSIS — N760 Acute vaginitis: Secondary | ICD-10-CM | POA: Diagnosis not present

## 2019-02-03 DIAGNOSIS — B3731 Acute candidiasis of vulva and vagina: Secondary | ICD-10-CM

## 2019-02-03 DIAGNOSIS — B373 Candidiasis of vulva and vagina: Secondary | ICD-10-CM

## 2019-02-03 LAB — URINALYSIS, COMPLETE (UACMP) WITH MICROSCOPIC
Bilirubin Urine: NEGATIVE
Glucose, UA: NEGATIVE mg/dL
Leukocytes,Ua: NEGATIVE
Nitrite: NEGATIVE
Protein, ur: NEGATIVE mg/dL
Specific Gravity, Urine: 1.02 (ref 1.005–1.030)
pH: 6 (ref 5.0–8.0)

## 2019-02-03 LAB — WET PREP, GENITAL
Sperm: NONE SEEN
Trich, Wet Prep: NONE SEEN

## 2019-02-03 MED ORDER — METRONIDAZOLE 500 MG PO TABS: 500.0000 mg | ORAL_TABLET | Freq: Two times a day (BID) | ORAL | 0 refills | Status: DC

## 2019-02-03 MED ORDER — FLUCONAZOLE 150 MG PO TABS: ORAL_TABLET | ORAL | 0 refills | Status: DC

## 2019-02-03 NOTE — ED Triage Notes (Signed)
Pt states "I think I have a UTI".  Reports urinating quite frequently today. Denies pain

## 2019-02-03 NOTE — ED Provider Notes (Signed)
[facility]    CSN: [id number] Arrival date & time: 02/03/19  1437     History   Chief Complaint Chief Complaint  Patient presents with  . Urinary Frequency    HPI Cathlyn Tersigni Zietz is a 48 y.o. female.   HPI  48 year old female presents with onset of urinary frequency and urgency but without dysuria.  She has had UTIs in the past which were similar but before she had always noticed an ammonia type smell which is absent this time.  There is not concerned regarding any STDs.  Denies any fever or chills has had no abdominal pain.  He has not had any nausea or vomiting or significant back pain.  She denies any vaginal discharge or irritation.       Past Medical History:  Diagnosis Date  . Cardiac disease   . Diffuse cystic mastopathy   . Family history of malignant neoplasm of breast   . Graves disease   . Hyperthyroidism     Patient Active Problem List   Diagnosis Date Noted  . Overweight 04/24/2018  . Breast cyst, left 12/14/2017  . Fatigue 01/29/2017  . Right thyroid nodule 01/28/2016  . Allergic rhinitis 10/23/2014  . Graves disease 10/23/2014  . Blood glucose elevated 10/23/2014  . Family history of breast cancer 09/25/2013  . Diffuse cystic mastopathy 09/25/2013  . 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 Hypercholesteremia 11/17/2008  . Dupuytren's contracture of foot 11/17/2008    Past Surgical History:  Procedure Laterality Date  . BREAST BIOPSY  2008  . BREAST BIOPSY Left 2/13    aspiration cyst  . DILATION AND CURETTAGE OF UTERUS  2011  . eyelid surgery     . ORBITAL RECONSTRUCTION Left 2008  . WISDOM TOOTH EXTRACTION      OB History    Gravida  1   Para  1   Term      Preterm      AB      Living  1     SAB      TAB      Ectopic      Multiple      Live Births           Obstetric Comments    FIRST PREGNANCY 24 FIRST MENSTRUAL 12         Home Medications    Prior to Admission medications   Medication Sig  Start Date End Date Taking? Authorizing Provider  CRANBERRY PO Take 2 tablets by mouth 2 (two) times daily.    [provider]  desogestrel-ethinyl estradiol (KARIVA) 0.15-0.02/0.01 MG (21/5) tablet Take 1 tablet by mouth daily. 02/21/18   Gae Dry, MD  FIBER PO Take 1 each by mouth daily. Gummy    [provider]  fish oil-omega-3 fatty acids 1000 MG capsule Take 2 g by mouth daily.    [provider]  fluconazole (DIFLUCAN) 150 MG tablet Take one tab for symptoms of yeast infection. Repeat x 1 in 72 hours. 02/03/19   Lorin Picket, PA-C  Garlic Oil 6314 MG CAPS Take by mouth.    [provider]  Ibuprofen (ADVIL) 200 MG CAPS Take by mouth as needed.    [provider]  Ibuprofen-Diphenhydramine Cit (ADVIL PM PO) Take 1 tablet by mouth as needed.    [provider]  metroNIDAZOLE (FLAGYL) 500 MG tablet Take 1 tablet (500 mg total) by mouth 2 (two) times daily. 02/03/19   Lynwood Dawley,  Malachy Chamber, PA-C  Multiple Vitamins-Minerals (MULTIVITAMIN PO) Take by mouth.    [provider]  vitamin B-12 (CYANOCOBALAMIN) 100 MCG tablet Take 100 mcg by mouth daily.    [provider]  Calcium Carbonate-Vitamin D 600-200 MG-UNIT TABS Take by mouth. 02/11/10 02/03/19  [provider]    Family History Family History  Problem Relation Age of Onset  . Diabetes Mother   . Hypertension Mother   . Hyperlipidemia Mother   . Stroke Father   . Hypertension Father   . Hyperlipidemia Father   . Hypothyroidism Father   . Prostate cancer Father   . Hyperlipidemia Sister   . Hypertension Sister   . Hyperlipidemia Sister   . Hypertension Sister   . Diabetes Sister   . Hyperlipidemia Sister   . Hypertension Sister   . Breast cancer Maternal Grandmother     Social History Social History   Tobacco Use  . Smoking status: Never Smoker  . Smokeless tobacco: Never Used  Substance Use Topics  . Alcohol use: Yes    Comment:  occasional  . Drug use: No     Allergies   Patient has no known allergies.   Review of Systems Review of Systems  Constitutional: Negative for activity change, appetite change, chills, diaphoresis, fatigue and fever.  Genitourinary: Positive for frequency, urgency and vaginal pain. Negative for difficulty urinating, dysuria, flank pain, hematuria and vaginal discharge.  All other systems reviewed and are negative.    Physical Exam Triage Vital Signs ED Triage Vitals  Enc Vitals Group     BP 02/03/19 1449 (!) 159/89     Pulse Rate 02/03/19 1449 83     Resp 02/03/19 1449 16     Temp 02/03/19 1449 98.1 F (36.7 C)     Temp Source 02/03/19 1449 Oral     SpO2 02/03/19 1449 100 %     Weight 02/03/19 1450 169 lb (76.7 kg)     Height 02/03/19 1450 5\' 4"  (1.626 m)     Head Circumference --      Peak Flow --      Pain Score 02/03/19 1450 0     Pain Loc --      Pain Edu? --      Excl. in [place]? --    No data found.  Updated Vital Signs BP (!) 159/89 (BP Location: Right Arm)   Pulse 83   Temp 98.1 F (36.7 C) (Oral)   Resp 16   Ht 5\' 4"  (1.626 m)   Wt 169 lb (76.7 kg)   LMP 01/16/2019   SpO2 100%   BMI 29.01 kg/m   Visual Acuity Right Eye Distance:   Left Eye Distance:   Bilateral Distance:    Right Eye Near:   Left Eye Near:    Bilateral Near:     Physical Exam Vitals signs and nursing note reviewed.  Constitutional:      General: She is not in acute distress.    Appearance: Normal appearance. She is not ill-appearing, toxic-appearing or diaphoretic.  HENT:     Head: Normocephalic.     Nose: Nose normal.     Mouth/Throat:     Mouth: Mucous membranes are moist.  Eyes:     Conjunctiva/sclera: Conjunctivae normal.     Pupils: Pupils are equal, round, and reactive to light.  Abdominal:     General: Abdomen is flat. There is no distension.     Palpations: Abdomen is soft.  Tenderness: There is no abdominal tenderness. There is no right CVA tenderness, left  CVA tenderness or guarding.  Musculoskeletal: Normal range of motion.  Skin:    General: Skin is warm and dry.  Neurological:     General: No focal deficit present.     Mental Status: She is alert and oriented to person, place, and time.  Psychiatric:        Mood and Affect: Mood normal.        Behavior: Behavior normal.        Thought Content: Thought content normal.        Judgment: Judgment normal.      UC Treatments / Results  Labs (all labs ordered are listed, but only abnormal results are displayed) Labs Reviewed  WET PREP, GENITAL - Abnormal; Notable for the following components:      Result Value   Yeast Wet Prep HPF POC PRESENT (*)    Clue Cells Wet Prep HPF POC PRESENT (*)    WBC, Wet Prep HPF POC FEW (*)    All other components within normal limits  URINALYSIS, COMPLETE (UACMP) WITH MICROSCOPIC - Abnormal; Notable for the following components:   APPearance CLOUDY (*)    Hgb urine dipstick TRACE (*)    Ketones, ur TRACE (*)    Bacteria, UA FEW (*)    All other components within normal limits    EKG   Radiology No results found.  Procedures Procedures (including critical care time)  Medications Ordered in UC Medications - No data to display  Initial Impression / Assessment and Plan / UC Course  I have reviewed the triage vital signs and the nursing notes.  Pertinent labs & imaging results that were available during my care of the patient were reviewed by me and considered in my medical decision making (see chart for details).   48 year old female presents today with symptoms of a possible UTI.  She complained mostly of urgency and frequency but no dysuria.  Denied any vaginal discharge or vaginal irritation.  Wet prep revealed clue cells and yeast cells.  I will culture urine for completeness.  She was prescribed Flagyl for the bacterial vaginosis and Diflucan for the yeast.  She has any further problems or continued problems she should follow-up with her  primary care physician.   Final Clinical Impressions(s) / UC Diagnoses   Final diagnoses:  BV (bacterial vaginosis)  Yeast vaginitis   Discharge Instructions   None    ED Prescriptions    Medication Sig Dispense Auth. Provider   metroNIDAZOLE (FLAGYL) 500 MG tablet Take 1 tablet (500 mg total) by mouth 2 (two) times daily. 14 tablet Crecencio Mc P, PA-C   fluconazole (DIFLUCAN) 150 MG tablet Take one tab for symptoms of yeast infection. Repeat x 1 in 72 hours. 2 tablet Lorin Picket, PA-C     Controlled Substance Prescriptions [place] Controlled Substance Registry consulted? Not Applicable   Lorin Picket, PA-C 02/03/19 1637

## 2019-02-04 LAB — URINE CULTURE

## 2019-02-05 ENCOUNTER — Emergency Department: Payer: Managed Care, Other (non HMO)

## 2019-02-05 ENCOUNTER — Telehealth: Payer: Self-pay

## 2019-02-05 ENCOUNTER — Encounter: Payer: Self-pay | Admitting: Emergency Medicine

## 2019-02-05 ENCOUNTER — Telehealth: Payer: Self-pay | Admitting: Family Medicine

## 2019-02-05 ENCOUNTER — Other Ambulatory Visit: Payer: Self-pay

## 2019-02-05 ENCOUNTER — Emergency Department
Admission: EM | Admit: 2019-02-05 | Discharge: 2019-02-05 | Disposition: A | Discharge status: Home / Self Care | Payer: Managed Care, Other (non HMO) | Attending: Emergency Medicine | Admitting: Emergency Medicine

## 2019-02-05 DIAGNOSIS — Z79899 Other long term (current) drug therapy: Secondary | ICD-10-CM | POA: Insufficient documentation

## 2019-02-05 DIAGNOSIS — R350 Frequency of micturition: Secondary | ICD-10-CM | POA: Diagnosis present

## 2019-02-05 DIAGNOSIS — N200 Calculus of kidney: Secondary | ICD-10-CM | POA: Diagnosis not present

## 2019-02-05 LAB — URINALYSIS, COMPLETE (UACMP) WITH MICROSCOPIC
Bacteria, UA: NONE SEEN
Specific Gravity, Urine: 1.035 — ABNORMAL HIGH (ref 1.005–1.030)

## 2019-02-05 LAB — CBC
HCT: 38.7 % (ref 36.0–46.0)
Hemoglobin: 12.7 g/dL (ref 12.0–15.0)
MCH: 29.3 pg (ref 26.0–34.0)
MCHC: 32.8 g/dL (ref 30.0–36.0)
MCV: 89.4 fL (ref 80.0–100.0)
Platelets: 412 10*3/uL — ABNORMAL HIGH (ref 150–400)
RBC: 4.33 MIL/uL (ref 3.87–5.11)
RDW: 13.1 % (ref 11.5–15.5)
WBC: 14.6 10*3/uL — ABNORMAL HIGH (ref 4.0–10.5)
nRBC: 0 % (ref 0.0–0.2)

## 2019-02-05 LAB — BASIC METABOLIC PANEL
Anion gap: 8 (ref 5–15)
BUN: 17 mg/dL (ref 6–20)
CO2: 23 mmol/L (ref 22–32)
Calcium: 9.1 mg/dL (ref 8.9–10.3)
Chloride: 106 mmol/L (ref 98–111)
Creatinine, Ser: 0.67 mg/dL (ref 0.44–1.00)
GFR calc Af Amer: >60 mL/min (ref >60)
GFR calc non Af Amer: >60 mL/min (ref >60)
Glucose, Bld: 92 mg/dL (ref 70–99)
Potassium: 3.9 mmol/L (ref 3.5–5.1)
Sodium: 137 mmol/L (ref 135–145)

## 2019-02-05 MED ORDER — OXYCODONE-ACETAMINOPHEN 5-325 MG PO TABS
1.0000 | ORAL_TABLET | Freq: Once | ORAL | Status: AC
  Administered 2019-02-05: 20:00:00 1 via ORAL
  Filled 2019-02-05: qty 1

## 2019-02-05 MED ORDER — OXYCODONE-ACETAMINOPHEN 5-325 MG PO TABS: 1.0000 | ORAL_TABLET | Freq: Four times a day (QID) | ORAL | 0 refills | Status: AC | PRN

## 2019-02-05 MED ORDER — CEPHALEXIN 500 MG PO CAPS: 500.0000 mg | ORAL_CAPSULE | Freq: Three times a day (TID) | ORAL | 0 refills | Status: AC

## 2019-02-05 MED ORDER — ONDANSETRON 4 MG PO TBDP
4.0000 mg | ORAL_TABLET | Freq: Once | ORAL | Status: AC
  Administered 2019-02-05: 4 mg via ORAL
  Filled 2019-02-05: qty 1

## 2019-02-05 MED ORDER — SODIUM CHLORIDE 0.9 % IV SOLN
1.0000 g | Freq: Once | INTRAVENOUS | Status: AC
  Administered 2019-02-05: 1 g via INTRAVENOUS
  Filled 2019-02-05: qty 10

## 2019-02-05 MED ORDER — TAMSULOSIN HCL 0.4 MG PO CAPS: 0.4000 mg | ORAL_CAPSULE | Freq: Every day | ORAL | 0 refills | Status: AC

## 2019-02-05 MED ORDER — KETOROLAC TROMETHAMINE 30 MG/ML IJ SOLN
30.0000 mg | Freq: Once | INTRAMUSCULAR | Status: AC
  Administered 2019-02-05: 22:00:00 30 mg via INTRAVENOUS
  Filled 2019-02-05: qty 1

## 2019-02-05 NOTE — Discharge Instructions (Signed)
You have been prescribed Percocet for pain. Take Keflex 3 times daily for the next week. Take Flomax daily for the next week. Return to the emergency department with worsening fever, low back pain or vomiting.

## 2019-02-05 NOTE — Telephone Encounter (Signed)
Pt went to an Urgent Care Monday for what she thought was a UTI.  She said the medication they give her is not helping.  They gave her Metronidazole and Diflucan.  She said they are not helping.  She is still having  Urgency and back pain.  Please advise  [phone]  Con Memos

## 2019-02-05 NOTE — ED Provider Notes (Signed)
[facility] Emergency Department Provider Note  ____________________________________________  Time seen: Approximately 7:04 PM  I have reviewed the triage vital signs and the nursing notes.   HISTORY  Chief Complaint Back Pain and Urinary Tract Infection    HPI Beth [name] is a 48 y.o. female with a history of hyperthyroidism, presents to the emergency department with persistent increased urinary frequency, new onset right-sided low back pain, nausea and low-grade fever at home.   Patient was seen and evaluated on 02/03/2019 and was diagnosed with bacterial vaginosis.  Patient states that she has been taking Flagyl and Diflucan as directed but reports that her symptoms have persisted.  Urinalysis was noncontributory for cystitis at prior urgent care encounter the patient had clue cells on wet prep.  Patient states that she called her primary care doctor who recommended Azo over-the-counter for dysuria.  Patient denies a history of nephrolithiasis or prior pyelonephritis.  She states that her menstrual cycle is still regular and she denies possibility of pregnancy.        Past Medical History:  Diagnosis Date  . Cardiac disease   . Diffuse cystic mastopathy   . Family history of malignant neoplasm of breast   . Graves disease   . Hyperthyroidism     Patient Active Problem List   Diagnosis Date Noted  . Overweight 04/24/2018  . Breast cyst, left 12/14/2017  . Fatigue 01/29/2017  . Right thyroid nodule 01/28/2016  . Allergic rhinitis 10/23/2014  . Graves disease 10/23/2014  . Blood glucose elevated 10/23/2014  . Family history of breast cancer 09/25/2013  . Diffuse cystic mastopathy 09/25/2013  . 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 Hypercholesteremia 11/17/2008  . Dupuytren's contracture of foot 11/17/2008    Past Surgical History:  Procedure Laterality Date  . BREAST BIOPSY  2008  . BREAST BIOPSY Left 2/13    aspiration cyst  . DILATION  AND CURETTAGE OF UTERUS  2011  . eyelid surgery     . ORBITAL RECONSTRUCTION Left 2008  . WISDOM TOOTH EXTRACTION      Prior to Admission medications   Medication Sig Start Date End Date Taking? Authorizing Provider  cephALEXin (KEFLEX) 500 MG capsule Take 1 capsule (500 mg total) by mouth 3 (three) times daily for 7 days. 02/05/19 02/12/19  Lannie Fields, PA-C  CRANBERRY PO Take 2 tablets by mouth 2 (two) times daily.    [provider]  desogestrel-ethinyl estradiol (KARIVA) 0.15-0.02/0.01 MG (21/5) tablet Take 1 tablet by mouth daily. 02/21/18   Gae Dry, MD  FIBER PO Take 1 each by mouth daily. Gummy    [provider]  fish oil-omega-3 fatty acids 1000 MG capsule Take 2 g by mouth daily.    [provider]  fluconazole (DIFLUCAN) 150 MG tablet Take one tab for symptoms of yeast infection. Repeat x 1 in 72 hours. 02/03/19   Lorin Picket, PA-C  Garlic Oil 1610 MG CAPS Take by mouth.    [provider]  Ibuprofen (ADVIL) 200 MG CAPS Take by mouth as needed.    [provider]  Ibuprofen-Diphenhydramine Cit (ADVIL PM PO) Take 1 tablet by mouth as needed.    [provider]  metroNIDAZOLE (FLAGYL) 500 MG tablet Take 1 tablet (500 mg total) by mouth 2 (two) times daily. 02/03/19   Lorin Picket, PA-C  Multiple Vitamins-Minerals (MULTIVITAMIN PO) Take by mouth.    [provider]  oxyCODONE-acetaminophen (PERCOCET/ROXICET) 5-325 MG tablet Take 1  tablet by mouth every 6 (six) hours as needed for up to 3 days. 02/05/19 02/08/19  Lannie Fields, PA-C  tamsulosin (FLOMAX) 0.4 MG CAPS capsule Take 1 capsule (0.4 mg total) by mouth daily for 7 days. 02/05/19 02/12/19  Lannie Fields, PA-C  vitamin B-12 (CYANOCOBALAMIN) 100 MCG tablet Take 100 mcg by mouth daily.    [provider]  Calcium Carbonate-Vitamin D 600-200 MG-UNIT TABS Take by mouth. 02/11/10 02/03/19  [provider]    Allergies Patient has no  known allergies.  Family History  Problem Relation Age of Onset  . Diabetes Mother   . Hypertension Mother   . Hyperlipidemia Mother   . Stroke Father   . Hypertension Father   . Hyperlipidemia Father   . Hypothyroidism Father   . Prostate cancer Father   . Hyperlipidemia Sister   . Hypertension Sister   . Hyperlipidemia Sister   . Hypertension Sister   . Diabetes Sister   . Hyperlipidemia Sister   . Hypertension Sister   . Breast cancer Maternal Grandmother     Social History Social History   Tobacco Use  . Smoking status: Never Smoker  . Smokeless tobacco: Never Used  Substance Use Topics  . Alcohol use: Yes    Comment: occasional  . Drug use: No     Review of Systems  Constitutional: No fever/chills Eyes: No visual changes. No discharge ENT: No upper respiratory complaints. Cardiovascular: no chest pain. Respiratory: no cough. No SOB. Gastrointestinal: No abdominal pain.  No nausea, no vomiting.  No diarrhea.  No constipation. Genitourinary: Patient has dysuria, increased urinary frequency and right-sided flank pain. Musculoskeletal: Negative for musculoskeletal pain. Skin: Negative for rash, abrasions, lacerations, ecchymosis. Neurological: Negative for headaches, focal weakness or numbness.   ____________________________________________   PHYSICAL EXAM:  VITAL SIGNS: ED Triage Vitals [02/05/19 1813]  Enc Vitals Group     BP (!) 171/89     Pulse Rate 98     Resp 18     Temp 98.3 F (36.8 C)     Temp Source Oral     SpO2 95 %     Weight 169 lb (76.7 kg)     Height 5\' 4"  (1.626 m)     Head Circumference      Peak Flow      Pain Score 5     Pain Loc      Pain Edu?      Excl. in [place]?      Constitutional: Alert and oriented. Well appearing and in no acute distress. Eyes: Conjunctivae are normal. PERRL. EOMI. Head: Atraumatic.  Cardiovascular: Normal rate, regular rhythm. Normal S1 and S2.  Good peripheral circulation. Respiratory: Normal  respiratory effort without tachypnea or retractions. Lungs CTAB. Good air entry to the bases with no decreased or absent breath sounds. Gastrointestinal: Bowel sounds 4 quadrants. Soft and nontender to palpation. No guarding or rigidity. No palpable masses. No distention.  Patient has right-sided CVA tenderness. Musculoskeletal: Full range of motion to all extremities. No gross deformities appreciated. Neurologic:  Normal speech and language. No gross focal neurologic deficits are appreciated.  Skin:  Skin is warm, dry and intact. No rash noted. Psychiatric: Mood and affect are normal. Speech and behavior are normal. Patient exhibits appropriate insight and judgement.   ____________________________________________   LABS (all labs ordered are listed, but only abnormal results are displayed)  Labs Reviewed  URINALYSIS, COMPLETE (UACMP) WITH MICROSCOPIC - Abnormal; Notable for the following components:  Result Value   Color, Urine ORANGE (*)    APPearance CLEAR (*)    Specific Gravity, Urine 1.035 (*)    Glucose, UA   (*)    Value: TEST NOT REPORTED DUE TO COLOR INTERFERENCE OF URINE PIGMENT   Hgb urine dipstick   (*)    Value: TEST NOT REPORTED DUE TO COLOR INTERFERENCE OF URINE PIGMENT   Bilirubin Urine   (*)    Value: TEST NOT REPORTED DUE TO COLOR INTERFERENCE OF URINE PIGMENT   Ketones, ur   (*)    Value: TEST NOT REPORTED DUE TO COLOR INTERFERENCE OF URINE PIGMENT   Protein, ur   (*)    Value: TEST NOT REPORTED DUE TO COLOR INTERFERENCE OF URINE PIGMENT   Nitrite   (*)    Value: TEST NOT REPORTED DUE TO COLOR INTERFERENCE OF URINE PIGMENT   Leukocytes,Ua   (*)    Value: TEST NOT REPORTED DUE TO COLOR INTERFERENCE OF URINE PIGMENT   All other components within normal limits  CBC - Abnormal; Notable for the following components:   WBC 14.6 (*)    Platelets 412 (*)    All other components within normal limits  URINE CULTURE  BASIC METABOLIC PANEL    ____________________________________________  EKG   ____________________________________________  RADIOLOGY I personally viewed and evaluated these images as part of my medical decision making, as well as reviewing the written report by the radiologist.  Patient has 3 mm stone at distal right ureter with mild hydronephrosis  No results found.  ____________________________________________    PROCEDURES  Procedure(s) performed:    Procedures    Medications  cefTRIAXone (ROCEPHIN) 1 g in sodium chloride 0.9 % 100 mL IVPB (1 g Intravenous New Bag/Given 02/05/19 2158)  oxyCODONE-acetaminophen (PERCOCET/ROXICET) 5-325 MG per tablet 1 tablet (1 tablet Oral Given 02/05/19 1937)  ondansetron (ZOFRAN-ODT) disintegrating tablet 4 mg (4 mg Oral Given 02/05/19 1937)  ketorolac (TORADOL) 30 MG/ML injection 30 mg (30 mg Intravenous Given 02/05/19 2153)     ____________________________________________   INITIAL IMPRESSION / ASSESSMENT AND PLAN / ED COURSE  Pertinent labs & imaging results that were available during my care of the patient were reviewed by me and considered in my medical decision making (see chart for details).  Review of the [place] CSRS was performed in accordance of the [place] prior to dispensing any controlled drugs.           Assessment and plan Nephrolithiasis 48 year old female presents to the emergency department with 3 days of dysuria, increased urinary frequency, low back pain, nausea and low-grade fever that started today.  Patient was hypertensive at triage but vital signs were otherwise reassuring.  She was afebrile.  On physical exam, patient did seem uncomfortable.  She had right-sided CVA tenderness but no suprapubic tenderness to palpation.  She had no other abdominal pain to palpation.  Differential diagnosis includes pyelonephritis, nephrolithiasis, cystitis, BV...  Patient had leukocytosis on CBC.  BMP was reassuring.  Urine culture is in process.   Urinalysis results were unclear due to Azo use.  CT renal stone study was obtained to rule out nephrolithiasis.  CT renal stone study reveals a 3 mm right-sided distal ureteral stone with associated hydronephrosis.  Patient's case was discussed with attending, Dr. Joni Fears.  We agreed to treat patient with IV Rocephin and discharged with Keflex.  Patient was also discharged with Flomax and a short course of Percocet.  She was given strict return precautions to return to the  emergency department with nausea, vomiting or worsening right-sided back pain.  She voiced understanding.  ____________________________________________  FINAL CLINICAL IMPRESSION(S) / ED DIAGNOSES  Final diagnoses:  Kidney stone      NEW MEDICATIONS STARTED DURING THIS VISIT:  ED Discharge Orders         Ordered    oxyCODONE-acetaminophen (PERCOCET/ROXICET) 5-325 MG tablet  Every 6 hours PRN     02/05/19 2121    cephALEXin (KEFLEX) 500 MG capsule  3 times daily     02/05/19 2121    tamsulosin (FLOMAX) 0.4 MG CAPS capsule  Daily     02/05/19 2121              This chart was dictated using voice recognition software/Dragon. Despite best efforts to proofread, errors can occur which can change the meaning. Any change was purely unintentional.    Lannie Fields, PA-C 02/05/19 2216    Carrie Mew, MD 02/08/19 [id number]

## 2019-02-05 NOTE — Telephone Encounter (Signed)
I think that Metronidazole and Diflucan are for the BV and yeast infection.  We can treat UTI with Keflex 500mg  BID x5 days. OK to Rx if patient agrees. Can we try to get records from UC as well?

## 2019-02-05 NOTE — Telephone Encounter (Signed)
noted 

## 2019-02-05 NOTE — Telephone Encounter (Signed)
Patient's husband Nicki Reaper) had called the team health after hours line around 7:01 PM on 02/04/2019. He stated that the patient had gone to urgent care on 02/03/2019 for UTI symptoms. Patients was prescribed flagyl and diflucan. Patient had taking 2 does of the flagyl and had started having the following symptoms:frequency, dysuria, and bladder spasm. Patient was advised to continue to take the medication. I returned patient call and spoke with her husband Nicki Reaper) and he states that the patient is doing much better and went to work today. Nicki Reaper stated that patient had labs drawn, UA, and did a self swab for BV & yeast at the urgent care. He stated patient swab did come back positive for both BV & yeast. Patient also had started taking AZO along with the Flagyl & Diflucan to help with her symptoms.FYI

## 2019-02-05 NOTE — ED Triage Notes (Signed)
Pt presents to ED via POV, states was seen several days and began treatment for UTI, states pain has progressed to lower abdomen and back, also c/o fever at this time.

## 2019-02-06 ENCOUNTER — Telehealth: Payer: Self-pay

## 2019-02-06 LAB — URINE CULTURE

## 2019-02-06 NOTE — Telephone Encounter (Signed)
Patient was seen in the ER last night and had a CT scan done. ER doctor wants to make sure that you are aware of results. For 2.7 cm left-sided breast nodule.

## 2019-02-06 NOTE — Telephone Encounter (Signed)
Patient advised as below. Patient went to [facility] ER again last night and was prescribed Flomax, Keflex and oxycodone. sd

## 2019-02-06 NOTE — Telephone Encounter (Signed)
OK. Noted 

## 2019-02-06 NOTE — Telephone Encounter (Signed)
I see that result.  On her mammogram from earlier this month they saw some abnormalities as well.  She is scheduled for the additional imaging yet?  It looks like Dr Kenton Kingfisher ordered the original mammogram.

## 2019-02-07 NOTE — Telephone Encounter (Signed)
Noted  

## 2019-02-07 NOTE — Telephone Encounter (Signed)
Patient reports she has an appt for additional imaging on 03/05/2019 at [facility].

## 2019-02-10 ENCOUNTER — Other Ambulatory Visit: Payer: Self-pay

## 2019-02-10 ENCOUNTER — Encounter: Payer: Self-pay | Admitting: Family Medicine

## 2019-02-10 ENCOUNTER — Ambulatory Visit: Payer: Managed Care, Other (non HMO) | Admitting: Family Medicine

## 2019-02-10 VITALS — BP 136/85 | HR 99 | Temp 97.5°F | Resp 16 | Ht 64.0 in | Wt 169.0 lb

## 2019-02-10 DIAGNOSIS — N200 Calculus of kidney: Secondary | ICD-10-CM

## 2019-02-10 DIAGNOSIS — N3000 Acute cystitis without hematuria: Secondary | ICD-10-CM

## 2019-02-10 NOTE — Patient Instructions (Signed)

## 2019-02-10 NOTE — Progress Notes (Signed)
Patient: Beth [name] Female    DOB: 09-24-1970   48 y.o.   MRN: [id number] Visit Date: 02/10/2019  Today's Provider: Lavon Paganini, MD   Chief Complaint  Patient presents with  . Hospitalization Follow-up   Subjective:    HPI  Follow up ER visit  Patient was seen in ER for UTI on 02/05/2019. She was treated for kidney stone. Treatment for this included: labs and IV. She reports good compliance with treatment. She reports this condition is Improved.  ----------------------------------------------  Patient states her symptoms have completely resolved since ER visit. Patent has completed metronidazole and diflucan. She is still taking Keflex.  Symptoms started on 8/16.  We did not have appts available.  She went to UC.  She was diagnosed with BV and started on Metronidazole and Flagyl.  She then spoke to after-hours RN for our clinic.  Tried AZO and was better for a bit. Then started getting more flank pain and went to ED.  Was diagnosed with 2-3 mm R kidney stone in ureter  Her symptoms are much better and feeling back to normal self.  She is back to work.  She is finishing 7d course of Flomax and Keflex.  She is pushing fluids    No Known Allergies   Current Outpatient Medications:  .  cephALEXin (KEFLEX) 500 MG capsule, Take 1 capsule (500 mg total) by mouth 3 (three) times daily for 7 days., Disp: 21 capsule, Rfl: 0 .  CRANBERRY PO, Take 2 tablets by mouth 2 (two) times daily., Disp: , Rfl:  .  desogestrel-ethinyl estradiol (KARIVA) 0.15-0.02/0.01 MG (21/5) tablet, Take 1 tablet by mouth daily., Disp: 28 tablet, Rfl: 12 .  FIBER PO, Take 1 each by mouth daily. Gummy, Disp: , Rfl:  .  fish oil-omega-3 fatty acids 1000 MG capsule, Take 2 g by mouth daily., Disp: , Rfl:  .  Garlic Oil 123XX123 MG CAPS, Take by mouth., Disp: , Rfl:  .  Ibuprofen (ADVIL) 200 MG CAPS, Take by mouth as needed., Disp: , Rfl:  .  Ibuprofen-Diphenhydramine Cit (ADVIL PM PO), Take 1 tablet by  mouth as needed., Disp: , Rfl:  .  Multiple Vitamins-Minerals (MULTIVITAMIN PO), Take by mouth., Disp: , Rfl:  .  tamsulosin (FLOMAX) 0.4 MG CAPS capsule, Take 1 capsule (0.4 mg total) by mouth daily for 7 days., Disp: 30 capsule, Rfl: 0 .  vitamin B-12 (CYANOCOBALAMIN) 100 MCG tablet, Take 100 mcg by mouth daily., Disp: , Rfl:  .  fluconazole (DIFLUCAN) 150 MG tablet, Take one tab for symptoms of yeast infection. Repeat x 1 in 72 hours., Disp: 2 tablet, Rfl: 0 .  metroNIDAZOLE (FLAGYL) 500 MG tablet, Take 1 tablet (500 mg total) by mouth 2 (two) times daily. (Patient not taking: Reported on 02/10/2019), Disp: 14 tablet, Rfl: 0  Review of Systems  Constitutional: Negative for appetite change, chills, fatigue and fever.  Respiratory: Negative for chest tightness and shortness of breath.   Cardiovascular: Negative for chest pain and palpitations.  Gastrointestinal: Negative for abdominal pain, nausea and vomiting.  Neurological: Negative for dizziness and weakness.    Social History   Tobacco Use  . Smoking status: Never Smoker  . Smokeless tobacco: Never Used  Substance Use Topics  . Alcohol use: Yes    Comment: occasional      Objective:   BP 136/85 (BP Location: Right Arm, Patient Position: Sitting, Cuff Size: Large)   Pulse 99   Temp Marland Kitchen)  97.5 F (36.4 C) (Oral)   Resp 16   Ht 5\' 4"  (1.626 m)   Wt 169 lb (76.7 kg)   LMP 01/16/2019   SpO2 96%   BMI 29.01 kg/m  Vitals:   02/10/19 1036  BP: 136/85  Pulse: 99  Resp: 16  Temp: (!) 97.5 F (36.4 C)  TempSrc: Oral  SpO2: 96%  Weight: 169 lb (76.7 kg)  Height: 5\' 4"  (1.626 m)     Physical Exam Vitals signs reviewed.  Constitutional:      General: She is not in acute distress.    Appearance: Normal appearance. She is well-developed. She is not diaphoretic.  HENT:     Head: Normocephalic and atraumatic.  Eyes:     General: No scleral icterus.    Conjunctiva/sclera: Conjunctivae normal.  Neck:     Musculoskeletal:  Neck supple.     Thyroid: No thyromegaly.  Cardiovascular:     Rate and Rhythm: Normal rate and regular rhythm.     Pulses: Normal pulses.     Heart sounds: Normal heart sounds. No murmur.  Pulmonary:     Effort: Pulmonary effort is normal. No respiratory distress.     Breath sounds: Normal breath sounds. No wheezing, rhonchi or rales.  Abdominal:     General: There is no distension.     Palpations: Abdomen is soft.     Tenderness: There is no abdominal tenderness. There is no right CVA tenderness or left CVA tenderness.  Musculoskeletal:     Right lower leg: No edema.     Left lower leg: No edema.  Lymphadenopathy:     Cervical: No cervical adenopathy.  Skin:    General: Skin is warm and dry.     Capillary Refill: Capillary refill takes less than 2 seconds.     Findings: No rash.  Neurological:     Mental Status: She is alert and oriented to person, place, and time. Mental status is at baseline.  Psychiatric:        Mood and Affect: Mood normal.        Behavior: Behavior normal.     Ct Renal Stone Study  Result Date: 02/06/2019 CLINICAL DATA:  Flank pain EXAM: CT ABDOMEN AND PELVIS WITHOUT CONTRAST TECHNIQUE: Multidetector CT imaging of the abdomen and pelvis was performed following the standard protocol without IV contrast. COMPARISON:  None. FINDINGS: Lower chest: The lung bases are clear. The heart size is normal. There is a 2.7 cm left-sided breast nodule. Hepatobiliary: The liver is normal. Normal gallbladder.There is no biliary ductal dilation. Pancreas: Normal contours without ductal dilatation. No peripancreatic fluid collection. Spleen: No splenic laceration or hematoma. Adrenals/Urinary Tract: --Adrenal glands: No adrenal hemorrhage. --Right kidney/ureter: There is mild right-sided hydronephrosis secondary to a 2-3 mm stone in the distal right ureter nearly at the right UVJ. There are no additional residual stones in the right kidney. --Left kidney/ureter: There are  multiple punctate stones in the lower pole the left kidney. No left-sided hydronephrosis. There are probable peripelvic cysts on the left. --Urinary bladder: The urinary bladder is decompressed and therefore is poorly evaluated. Stomach/Bowel: --Stomach/Duodenum: No hiatal hernia or other gastric abnormality. Normal duodenal course and caliber. --Small bowel: No dilatation or inflammation. --Colon: No focal abnormality. --Appendix: Normal. Vascular/Lymphatic: Normal course and caliber of the major abdominal vessels. --No retroperitoneal lymphadenopathy. --No mesenteric lymphadenopathy. --No pelvic or inguinal lymphadenopathy. Reproductive: Unremarkable Other: No ascites or free air. The abdominal wall is normal. Musculoskeletal. No acute displaced fractures. IMPRESSION:  1. Mild right-sided hydroureteronephrosis secondary to a 2-3 mm stone in the distal right ureter. 2. Left-sided nephrolithiasis without evidence for hydronephrosis. 3. There is an indeterminate 2.7 cm structure in the left breast tissue. Comparison to prior mammograms and/or follow-up outpatient mammography is recommended for further evaluation of this finding. These results will be called to the ordering clinician or representative by the Radiologist Assistant, and communication documented in the PACS or zVision Dashboard. Electronically Signed   By: Constance Holster M.D.   On: 02/06/2019 15:21     Assessment & Plan   1. Nephrolithiasis 2. Acute cystitis without hematuria - much improved - reviewed urine culture with patient, but it shows multiple species of bacteria - will finish course of Keflex - ok to call if yeast symptoms recur after finishing abx - continue flomax for total of 7 days - return precautions discussed   Return if symptoms worsen or fail to improve.   The entirety of the information documented in the History of Present Illness, Review of Systems and Physical Exam were personally obtained by me. Portions of this  information were initially documented by April Miller, CMA and reviewed by me for thoroughness and accuracy.    Bacigalupo, Dionne Bucy, MD MPH [facility]

## 2019-02-12 ENCOUNTER — Encounter: Payer: Self-pay | Admitting: Family Medicine

## 2019-02-12 MED ORDER — FLUCONAZOLE 150 MG PO TABS: 150.0000 mg | ORAL_TABLET | Freq: Once | ORAL | 0 refills | Status: AC

## 2019-02-27 ENCOUNTER — Ambulatory Visit (INDEPENDENT_AMBULATORY_CARE_PROVIDER_SITE_OTHER): Payer: Managed Care, Other (non HMO) | Admitting: Obstetrics & Gynecology

## 2019-02-27 ENCOUNTER — Encounter: Payer: Self-pay | Admitting: Obstetrics & Gynecology

## 2019-02-27 ENCOUNTER — Other Ambulatory Visit: Payer: Self-pay

## 2019-02-27 VITALS — BP 130/80 | Ht 64.0 in | Wt 169.0 lb

## 2019-02-27 DIAGNOSIS — Z01419 Encounter for gynecological examination (general) (routine) without abnormal findings: Secondary | ICD-10-CM | POA: Diagnosis not present

## 2019-02-27 MED ORDER — DESOGESTREL-ETHINYL ESTRADIOL 0.15-0.02/0.01 MG (21/5) PO TABS: 1.0000 | ORAL_TABLET | Freq: Every day | ORAL | 3 refills | Status: DC

## 2019-02-27 NOTE — Progress Notes (Signed)
HPI:      [name] is a 48 y.o. G1P1 who LMP was Patient's last menstrual period was 12/25/2018., she presents today for her annual examination. The patient has no complaints today. The patient is sexually active. Her last pap: approximate date 2019 and was normal and last mammogram: approximate date 01/2019 and was abnormal: she has follow up scheduled for 9/16- Left breast density. The patient does perform self breast exams.  There is notable family history of breast or ovarian cancer in her family.  The patient has regular exercise: yes.  The patient denies current symptoms of depression.    GYN History: Contraception: OCP (estrogen/progesterone)  PMHx: Past Medical History:  Diagnosis Date  . Cardiac disease   . Diffuse cystic mastopathy   . Family history of malignant neoplasm of breast   . Graves disease   . Hyperthyroidism    Past Surgical History:  Procedure Laterality Date  . BREAST BIOPSY  2008  . BREAST BIOPSY Left 2/13    aspiration cyst  . DILATION AND CURETTAGE OF UTERUS  2011  . eyelid surgery     . ORBITAL RECONSTRUCTION Left 2008  . WISDOM TOOTH EXTRACTION     Family History  Problem Relation Age of Onset  . Diabetes Mother   . Hypertension Mother   . Hyperlipidemia Mother   . Stroke Father   . Hypertension Father   . Hyperlipidemia Father   . Hypothyroidism Father   . Prostate cancer Father   . Hyperlipidemia Sister   . Hypertension Sister   . Hyperlipidemia Sister   . Hypertension Sister   . Diabetes Sister   . Hyperlipidemia Sister   . Hypertension Sister   . Breast cancer Maternal Grandmother    Social History   Tobacco Use  . Smoking status: Never Smoker  . Smokeless tobacco: Never Used  Substance Use Topics  . Alcohol use: Yes    Comment: occasional  . Drug use: No    Current Outpatient Medications:  .  CRANBERRY PO, Take 2 tablets by mouth 2 (two) times daily., Disp: , Rfl:  .  desogestrel-ethinyl estradiol (KARIVA)  0.15-0.02/0.01 MG (21/5) tablet, Take 1 tablet by mouth daily., Disp: 84 tablet, Rfl: 3 .  FIBER PO, Take 1 each by mouth daily. Gummy, Disp: , Rfl:  .  fish oil-omega-3 fatty acids 1000 MG capsule, Take 2 g by mouth daily., Disp: , Rfl:  .  Garlic Oil 123XX123 MG CAPS, Take by mouth., Disp: , Rfl:  .  Ibuprofen (ADVIL) 200 MG CAPS, Take by mouth as needed., Disp: , Rfl:  .  Ibuprofen-Diphenhydramine Cit (ADVIL PM PO), Take 1 tablet by mouth as needed., Disp: , Rfl:  .  Multiple Vitamins-Minerals (MULTIVITAMIN PO), Take by mouth., Disp: , Rfl:  .  vitamin B-12 (CYANOCOBALAMIN) 100 MCG tablet, Take 100 mcg by mouth daily., Disp: , Rfl:  Allergies: Patient has no known allergies.  Review of Systems  Constitutional: Negative for chills, fever and malaise/fatigue.  HENT: Negative for congestion, sinus pain and sore throat.   Eyes: Negative for blurred vision and pain.  Respiratory: Negative for cough and wheezing.   Cardiovascular: Negative for chest pain and leg swelling.  Gastrointestinal: Negative for abdominal pain, constipation, diarrhea, heartburn, nausea and vomiting.  Genitourinary: Negative for dysuria, frequency, hematuria and urgency.  Musculoskeletal: Negative for back pain, joint pain, myalgias and neck pain.  Skin: Negative for itching and rash.  Neurological: Negative for dizziness, tremors and weakness.  Endo/Heme/Allergies: Does not bruise/bleed easily.  Psychiatric/Behavioral: Negative for depression. The patient is not nervous/anxious and does not have insomnia.     Objective: BP 130/80   Ht 5\' 4"  (1.626 m)   Wt 169 lb (76.7 kg)   LMP 12/25/2018   BMI 29.01 kg/m   Filed Weights   02/27/19 0810  Weight: 169 lb (76.7 kg)   Body mass index is 29.01 kg/m. Physical Exam Constitutional:      General: She is not in acute distress.    Appearance: She is well-developed.  Genitourinary:     Pelvic exam was performed with patient supine.     Vagina, uterus and rectum  normal.     No lesions in the vagina.     No vaginal bleeding.     No cervical motion tenderness, friability, lesion or polyp.     Uterus is mobile.     Uterus is not enlarged.     No uterine mass detected.    Uterus is midaxial.     No right or left adnexal mass present.     Right adnexa not tender.     Left adnexa not tender.  HENT:     Head: Normocephalic and atraumatic. No laceration.     Right Ear: Hearing normal.     Left Ear: Hearing normal.     Mouth/Throat:     Pharynx: Uvula midline.  Eyes:     Pupils: Pupils are equal, round, and reactive to light.  Neck:     Musculoskeletal: Normal range of motion and neck supple.     Thyroid: No thyromegaly.  Cardiovascular:     Rate and Rhythm: Normal rate and regular rhythm.     Heart sounds: No murmur. No friction rub. No gallop.   Pulmonary:     Effort: Pulmonary effort is normal. No respiratory distress.     Breath sounds: Normal breath sounds. No wheezing.  Chest:     Breasts:        Right: No mass, skin change or tenderness.        Left: No skin change or tenderness.     Comments: Left breast 11 o'clock density (NT, mobile, no skin changes) Abdominal:     General: Bowel sounds are normal. There is no distension.     Palpations: Abdomen is soft.     Tenderness: There is no abdominal tenderness. There is no rebound.  Musculoskeletal: Normal range of motion.  Neurological:     Mental Status: She is alert and oriented to person, place, and time.     Cranial Nerves: No cranial nerve deficit.  Skin:    General: Skin is warm and dry.  Psychiatric:        Judgment: Judgment normal.  Vitals signs reviewed.     Assessment:  ANNUAL EXAM 1. Women's annual routine gynecological examination      Screening Plan:            1.  Cervical Screening-  Pap smear schedule reviewed with patient, Pap smear not performed  2. Breast screening- Exam annually and mammogram>40 planned  Follow up for left breast next week  3.  Colonoscopy every 10 years, Hemoccult testing - after age 72  4. Labs managed by PCP  5. Counseling for contraception: oral contraceptives (estrogen/progesterone)  To consider stopping OCPs after age 67 and see how she feels and how her periods are.  Can consider lab testing for fertility if concerned, after 6-8 weeks off of the  pills (to use back up birth control until proven)     F/U  Return in about 1 year (around 02/27/2020) for Annual.  Barnett Applebaum, MD, [facility], [facility] 02/27/2019  8:49 AM

## 2019-03-05 ENCOUNTER — Encounter: Payer: Self-pay | Admitting: Obstetrics & Gynecology

## 2019-03-06 ENCOUNTER — Other Ambulatory Visit: Payer: Self-pay | Admitting: Obstetrics & Gynecology

## 2019-04-17 ENCOUNTER — Encounter: Payer: Self-pay | Admitting: Emergency Medicine

## 2019-04-17 ENCOUNTER — Other Ambulatory Visit: Payer: Self-pay

## 2019-04-17 ENCOUNTER — Ambulatory Visit
Admission: EM | Admit: 2019-04-17 | Discharge: 2019-04-17 | Disposition: A | Discharge status: Home / Self Care | Payer: Managed Care, Other (non HMO) | Attending: Emergency Medicine | Admitting: Emergency Medicine

## 2019-04-17 DIAGNOSIS — R319 Hematuria, unspecified: Secondary | ICD-10-CM | POA: Diagnosis not present

## 2019-04-17 DIAGNOSIS — R350 Frequency of micturition: Secondary | ICD-10-CM | POA: Diagnosis not present

## 2019-04-17 DIAGNOSIS — N390 Urinary tract infection, site not specified: Secondary | ICD-10-CM

## 2019-04-17 LAB — URINALYSIS, COMPLETE (UACMP) WITH MICROSCOPIC
Bilirubin Urine: NEGATIVE
Glucose, UA: NEGATIVE mg/dL
Ketones, ur: NEGATIVE mg/dL
Nitrite: NEGATIVE
Protein, ur: 100 mg/dL — AB
RBC / HPF: >50 RBC/hpf (ref 0–5)
Specific Gravity, Urine: 1.025 (ref 1.005–1.030)
WBC, UA: >50 WBC/hpf (ref 0–5)
pH: 7 (ref 5.0–8.0)

## 2019-04-17 MED ORDER — CEPHALEXIN 500 MG PO CAPS: 500.0000 mg | ORAL_CAPSULE | Freq: Two times a day (BID) | ORAL | 0 refills | Status: AC

## 2019-04-17 NOTE — ED Triage Notes (Signed)
Pt c/o urinary frequency, urinary odor, cloudy urine and hematuria. She states it started this morning. Denies lower back pain, fever or pelvic pain.

## 2019-04-17 NOTE — Discharge Instructions (Addendum)
Take medication as prescribed. Rest. Drink plenty of fluids.  ° °Follow up with your primary care physician this week as needed. Return to Urgent care for new or worsening concerns.  ° °

## 2019-04-17 NOTE — ED Provider Notes (Signed)
[facility] ____________________________________________  Time seen: Approximately 9:20 AM  I have reviewed the triage vital signs and the nursing notes.   HISTORY  Chief Complaint Urinary Frequency   HPI Beth [name] is a 48 y.o. female presenting for evaluation of urinary frequency, urinary urgency, odorous and cloudy urine that started this morning.  States some blood in her urine.  Denies abdominal pain, flank pain, fevers or vomiting.  Continue to eat and drink well.  No alleviating measures attempted this morning.  History of UTI as well as kidney stone, stating this feels like a UTI.  Denies recent antibiotic use.  No cough or recent sickness.  Reports otherwise doing well.  Virginia Crews, MD: PCP   Past Medical History:  Diagnosis Date  . Cardiac disease   . Diffuse cystic mastopathy   . Family history of malignant neoplasm of breast   . Graves disease   . Hyperthyroidism     Patient Active Problem List   Diagnosis Date Noted  . Overweight 04/24/2018  . Breast cyst, left 12/14/2017  . Fatigue 01/29/2017  . Right thyroid nodule 01/28/2016  . Allergic rhinitis 10/23/2014  . Graves disease 10/23/2014  . Blood glucose elevated 10/23/2014  . Family history of breast cancer 09/25/2013  . Diffuse cystic mastopathy 09/25/2013  . 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 Hypercholesteremia 11/17/2008  . Dupuytren's contracture of foot 11/17/2008    Past Surgical History:  Procedure Laterality Date  . BREAST BIOPSY  2008  . BREAST BIOPSY Left 2/13    aspiration cyst  . DILATION AND CURETTAGE OF UTERUS  2011  . eyelid surgery     . ORBITAL RECONSTRUCTION Left 2008  . WISDOM TOOTH EXTRACTION       No current facility-administered medications for this encounter.   Current Outpatient Medications:  .  CRANBERRY PO, Take 2 tablets by mouth 2 (two) times daily., Disp: , Rfl:  .  desogestrel-ethinyl estradiol (KARIVA) 0.15-0.02/0.01 MG (21/5)  tablet, Take 1 tablet by mouth daily., Disp: 84 tablet, Rfl: 3 .  FIBER PO, Take 1 each by mouth daily. Gummy, Disp: , Rfl:  .  fish oil-omega-3 fatty acids 1000 MG capsule, Take 2 g by mouth daily., Disp: , Rfl:  .  Garlic Oil 123XX123 MG CAPS, Take by mouth., Disp: , Rfl:  .  Ibuprofen (ADVIL) 200 MG CAPS, Take by mouth as needed., Disp: , Rfl:  .  Ibuprofen-Diphenhydramine Cit (ADVIL PM PO), Take 1 tablet by mouth as needed., Disp: , Rfl:  .  Multiple Vitamins-Minerals (MULTIVITAMIN PO), Take by mouth., Disp: , Rfl:  .  vitamin B-12 (CYANOCOBALAMIN) 100 MCG tablet, Take 100 mcg by mouth daily., Disp: , Rfl:  .  cephALEXin (KEFLEX) 500 MG capsule, Take 1 capsule (500 mg total) by mouth 2 (two) times daily for 7 days., Disp: 14 capsule, Rfl: 0  Allergies Patient has no known allergies.  Family History  Problem Relation Age of Onset  . Diabetes Mother   . Hypertension Mother   . Hyperlipidemia Mother   . Stroke Father   . Hypertension Father   . Hyperlipidemia Father   . Hypothyroidism Father   . Prostate cancer Father   . Hyperlipidemia Sister   . Hypertension Sister   . Hyperlipidemia Sister   . Hypertension Sister   . Diabetes Sister   . Hyperlipidemia Sister   . Hypertension Sister   . Breast cancer Maternal Grandmother     Social History Social History  Tobacco Use  . Smoking status: Never Smoker  . Smokeless tobacco: Never Used  Substance Use Topics  . Alcohol use: Yes    Comment: occasional  . Drug use: No    Review of Systems Constitutional: No fever ENT: No sore throat. Cardiovascular: Denies chest pain. Respiratory: Denies shortness of breath. Gastrointestinal: No abdominal pain.  No nausea, no vomiting.  No diarrhea.  Genitourinary: Positive for dysuria. Musculoskeletal: Negative for back pain. Skin: Negative for rash.   ____________________________________________   PHYSICAL EXAM:  VITAL SIGNS: ED Triage Vitals  Enc Vitals Group     BP 04/17/19  0834 (!) 149/108     Pulse Rate 04/17/19 0834 88     Resp 04/17/19 0834 18     Temp 04/17/19 0834 98.8 F (37.1 C)     Temp Source 04/17/19 0834 Oral     SpO2 04/17/19 0834 100 %     Weight 04/17/19 0831 169 lb (76.7 kg)     Height 04/17/19 0831 5\' 4"  (1.626 m)     Head Circumference --      Peak Flow --      Pain Score 04/17/19 0831 0     Pain Loc --      Pain Edu? --      Excl. in [place]? --     Constitutional: Alert and oriented. Well appearing and in no acute distress. Eyes: Conjunctivae are normal.  ENT      Head: Normocephalic and atraumatic. Cardiovascular: Normal rate, regular rhythm. Grossly normal heart sounds.  Good peripheral circulation. Respiratory: Normal respiratory effort without tachypnea nor retractions. Breath sounds are clear and equal bilaterally. No wheezes, rales, rhonchi. Gastrointestinal: Soft and nontender. No CVA tenderness. Musculoskeletal:  Steady gait.  Neurologic:  Normal speech and language. Speech is normal. No gait instability.  Skin:  Skin is warm, dry. Psychiatric: Mood and affect are normal. Speech and behavior are normal. Patient exhibits appropriate insight and judgment   ___________________________________________   LABS (all labs ordered are listed, but only abnormal results are displayed)  Labs Reviewed  URINALYSIS, COMPLETE (UACMP) WITH MICROSCOPIC - Abnormal; Notable for the following components:      Result Value   APPearance CLOUDY (*)    Hgb urine dipstick MODERATE (*)    Protein, ur 100 (*)    Leukocytes,Ua LARGE (*)    Bacteria, UA FEW (*)    All other components within normal limits  URINE CULTURE   ____________________________________________   PROCEDURES Procedures    INITIAL IMPRESSION / ASSESSMENT AND PLAN / ED COURSE  Pertinent labs & imaging results that were available during my care of the patient were reviewed by me and considered in my medical decision making (see chart for details).  well-appearing  patient.  Urinalysis reviewed, suspect UTI.  We will culture.  Will empirically start Keflex.  Cardiovascular fluids, supportive care.Discussed indication, risks and benefits of medications with patient.  Discussed follow up with Primary care physician this week as needed. Discussed follow up and return parameters including no resolution or any worsening concerns. Patient verbalized understanding and agreed to plan.   ____________________________________________   FINAL CLINICAL IMPRESSION(S) / ED DIAGNOSES  Final diagnoses:  Urinary tract infection with hematuria, site unspecified     ED Discharge Orders         Ordered    cephALEXin (KEFLEX) 500 MG capsule  2 times daily     04/17/19 0905           Note: This  dictation was prepared with Dragon dictation along with smaller phrase technology. Any transcriptional errors that result from this process are unintentional.         Marylene Land, NP 04/17/19 [phone]

## 2019-04-19 LAB — URINE CULTURE: Culture: 80000 — AB

## 2019-04-22 ENCOUNTER — Telehealth (HOSPITAL_COMMUNITY): Payer: Self-pay | Admitting: Emergency Medicine

## 2019-04-22 NOTE — Telephone Encounter (Signed)
Urine culture was positive for PROTEUS MIRABILIS and was given keflex  at urgent care visit. Attempted to reach patient. No answer at this time.

## 2019-04-28 ENCOUNTER — Other Ambulatory Visit: Payer: Self-pay

## 2019-04-28 ENCOUNTER — Encounter: Payer: Self-pay | Admitting: Family Medicine

## 2019-04-28 ENCOUNTER — Other Ambulatory Visit: Payer: Self-pay | Admitting: Family Medicine

## 2019-04-28 ENCOUNTER — Ambulatory Visit (INDEPENDENT_AMBULATORY_CARE_PROVIDER_SITE_OTHER): Payer: Managed Care, Other (non HMO) | Admitting: Family Medicine

## 2019-04-28 VITALS — BP 131/79 | HR 79 | Temp 97.3°F | Resp 16 | Ht 64.0 in | Wt 171.6 lb

## 2019-04-28 DIAGNOSIS — Z0000 Encounter for general adult medical examination without abnormal findings: Secondary | ICD-10-CM

## 2019-04-28 DIAGNOSIS — Z114 Encounter for screening for human immunodeficiency virus [HIV]: Secondary | ICD-10-CM

## 2019-04-28 DIAGNOSIS — E663 Overweight: Secondary | ICD-10-CM

## 2019-04-28 DIAGNOSIS — E041 Nontoxic single thyroid nodule: Secondary | ICD-10-CM

## 2019-04-28 DIAGNOSIS — E7800 Pure hypercholesterolemia, unspecified: Secondary | ICD-10-CM | POA: Diagnosis not present

## 2019-04-28 DIAGNOSIS — E0500 Thyrotoxicosis with diffuse goiter without thyrotoxic crisis or storm: Secondary | ICD-10-CM

## 2019-04-28 DIAGNOSIS — R739 Hyperglycemia, unspecified: Secondary | ICD-10-CM

## 2019-04-28 NOTE — Assessment & Plan Note (Signed)
Recheck A1c 

## 2019-04-28 NOTE — Patient Instructions (Signed)

## 2019-04-28 NOTE — Assessment & Plan Note (Signed)
Recheck TSH Asymptomatic

## 2019-04-28 NOTE — Progress Notes (Signed)
Patient: Beth [name], Female    DOB: 03-30-1971, 48 y.o.   MRN: [id number] Visit Date: 04/28/2019  Today's Provider: Lavon Paganini, MD   Chief Complaint  Patient presents with  . Annual Exam   Subjective:     Annual physical exam Beth [name] is a 48 y.o. female who presents today for health maintenance and complete physical. She feels well. She reports exercising no. She reports she is sleeping well. 04/24/2018 CPE 02/21/2018 Pap-negative, HPV negative 03/05/2019 Mammogram-abnormal findings. A large and adjacent smaller oval mass in seen to better advantage as compared to prior study 01/23/2019 and prior. No suspicipous. -----------------------------------------------------------------  Review of Systems  Constitutional: Negative.   HENT: Negative.   Eyes: Negative.   Respiratory: Negative.   Cardiovascular: Negative.   Gastrointestinal: Negative.   Endocrine: Negative.   Genitourinary: Negative.   Musculoskeletal: Negative.   Skin: Negative.   Allergic/Immunologic: Negative.   Neurological: Negative.   Hematological: Negative.   Psychiatric/Behavioral: Negative.     Social History      She  reports that she has never smoked. She has never used smokeless tobacco. She reports current alcohol use. She reports that she does not use drugs.       Social History   Socioeconomic History  . Marital status: Married    Spouse name: Not on file  . Number of children: Not on file  . Years of education: Not on file  . Highest education level: Not on file  Occupational History  . Not on file  Social Needs  . Financial resource strain: Not on file  . Food insecurity    Worry: Not on file    Inability: Not on file  . Transportation needs    Medical: Not on file    Non-medical: Not on file  Tobacco Use  . Smoking status: Never Smoker  . Smokeless tobacco: Never Used  Substance and Sexual Activity  . Alcohol use: Yes    Comment: occasional  . Drug use: No   . Sexual activity: Yes    Birth control/protection: Pill  Lifestyle  . Physical activity    Days per week: Not on file    Minutes per session: Not on file  . Stress: Not on file  Relationships  . Social Herbalist on phone: Not on file    Gets together: Not on file    Attends religious service: Not on file    Active member of club or organization: Not on file    Attends meetings of clubs or organizations: Not on file    Relationship status: Not on file  Other Topics Concern  . Not on file  Social History Narrative  . Not on file    Past Medical History:  Diagnosis Date  . Cardiac disease   . Diffuse cystic mastopathy   . Family history of malignant neoplasm of breast   . Graves disease   . Hyperthyroidism      Patient Active Problem List   Diagnosis Date Noted  . Overweight 04/24/2018  . Breast cyst, left 12/14/2017  . Fatigue 01/29/2017  . Right thyroid nodule 01/28/2016  . Allergic rhinitis 10/23/2014  . Graves disease 10/23/2014  . Blood glucose elevated 10/23/2014  . Family history of breast cancer 09/25/2013  . Diffuse cystic mastopathy 09/25/2013  . 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 Hypercholesteremia 11/17/2008  . Dupuytren's contracture of foot 11/17/2008    Past Surgical History:  Procedure Laterality Date  . BREAST BIOPSY  2008  . BREAST BIOPSY Left 2/13    aspiration cyst  . DILATION AND CURETTAGE OF UTERUS  2011  . eyelid surgery     . ORBITAL RECONSTRUCTION Left 2008  . WISDOM TOOTH EXTRACTION      Family History        Family Status  Relation Name Status  . Mother  Alive  . Father  Alive  . Sister  Alive  . Brother  Alive  . Sister  Alive  . Sister  Alive  . MGM  (Not Specified)        Her family history includes Breast cancer in her maternal grandmother; Diabetes in her mother and sister; Hyperlipidemia in her father, mother, sister, sister, and sister; Hypertension in her father, mother, sister, sister, and sister;  Hypothyroidism in her father; Prostate cancer in her father; Stroke in her father.      No Known Allergies   Current Outpatient Medications:  .  calcium-vitamin D (OSCAL WITH D) 500-200 MG-UNIT tablet, Take 1 tablet by mouth., Disp: , Rfl:  .  CRANBERRY PO, Take 2 tablets by mouth 2 (two) times daily., Disp: , Rfl:  .  desogestrel-ethinyl estradiol (KARIVA) 0.15-0.02/0.01 MG (21/5) tablet, Take 1 tablet by mouth daily., Disp: 84 tablet, Rfl: 3 .  FIBER PO, Take 1 each by mouth daily. Gummy, Disp: , Rfl:  .  fish oil-omega-3 fatty acids 1000 MG capsule, Take 2 g by mouth daily., Disp: , Rfl:  .  Garlic Oil 123XX123 MG CAPS, Take by mouth., Disp: , Rfl:  .  Ibuprofen (ADVIL) 200 MG CAPS, Take by mouth as needed., Disp: , Rfl:  .  Ibuprofen-Diphenhydramine Cit (ADVIL PM PO), Take 1 tablet by mouth as needed., Disp: , Rfl:  .  Multiple Vitamins-Minerals (MULTIVITAMIN PO), Take by mouth., Disp: , Rfl:  .  vitamin B-12 (CYANOCOBALAMIN) 100 MCG tablet, Take 100 mcg by mouth daily., Disp: , Rfl:    Patient Care Team: Virginia Crews, MD as PCP - General (Family Medicine) Christene Lye, MD (General Surgery) Gae Dry, MD as Referring Physician (Obstetrics and Gynecology)    Objective:    Vitals: BP 131/79 (BP Location: Left Arm, Patient Position: Sitting, Cuff Size: Normal)   Pulse 79   Temp (!) 97.3 F (36.3 C) (Temporal)   Resp 16   Ht 5\' 4"  (1.626 m)   Wt 171 lb 9.6 oz (77.8 kg)   LMP 04/10/2019 (Approximate)   BMI 29.46 kg/m    Vitals:   04/28/19 0901 04/28/19 0902  BP: (!) 145/83 131/79  Pulse: 84 79  Resp: 16   Temp: (!) 97.3 F (36.3 C)   TempSrc: Temporal   Weight: 171 lb 9.6 oz (77.8 kg)   Height: 5\' 4"  (1.626 m)      Physical Exam Vitals signs reviewed.  Constitutional:      General: She is not in acute distress.    Appearance: Normal appearance. She is well-developed. She is not diaphoretic.  HENT:     Head: Normocephalic and atraumatic.      Right Ear: Tympanic membrane, ear canal and external ear normal.     Left Ear: Tympanic membrane, ear canal and external ear normal.  Eyes:     General: No scleral icterus.    Conjunctiva/sclera: Conjunctivae normal.     Pupils: Pupils are equal, round, and reactive to light.  Neck:     Musculoskeletal: Neck supple.  Thyroid: No thyromegaly.  Cardiovascular:     Rate and Rhythm: Normal rate and regular rhythm.     Pulses: Normal pulses.     Heart sounds: Normal heart sounds. No murmur.  Pulmonary:     Effort: Pulmonary effort is normal. No respiratory distress.     Breath sounds: Normal breath sounds. No wheezing or rales.  Abdominal:     General: There is no distension.     Palpations: Abdomen is soft.     Tenderness: There is no abdominal tenderness.  Musculoskeletal:        General: No deformity.     Right lower leg: No edema.     Left lower leg: No edema.  Lymphadenopathy:     Cervical: No cervical adenopathy.  Skin:    General: Skin is warm and dry.     Capillary Refill: Capillary refill takes less than 2 seconds.     Findings: No rash.  Neurological:     Mental Status: She is alert and oriented to person, place, and time. Mental status is at baseline.  Psychiatric:        Mood and Affect: Mood normal.        Behavior: Behavior normal.        Thought Content: Thought content normal.      Depression Screen PHQ 2/9 Scores 04/28/2019 04/24/2018 01/29/2017  PHQ - 2 Score 2 0 0  PHQ- 9 Score 3 - -       Assessment & Plan:     Routine Health Maintenance and Physical Exam  Exercise Activities and Dietary recommendations Goals    . Exercise 150 minutes per week (moderate activity)       Immunization History  Administered Date(s) Administered  . Influenza-Unspecified 04/05/2017  . Td 04/24/2018  . Tdap 01/31/2008    Health Maintenance  Topic Date Due  . HIV Screening  09/23/1984  . INFLUENZA VACCINE  01/18/2019  . PAP SMEAR-Modifier  02/21/2021  .  TETANUS/TDAP  04/24/2028     Discussed health benefits of physical activity, and encouraged her to engage in regular exercise appropriate for her age and condition.    -------------------------------------------------------------------- Problem List Items Addressed This Visit      Endocrine   Diffuse thyrotoxic goiter    Recheck TSH Asymptomatic      Relevant Orders   Hemoglobin A1c   Right thyroid nodule   Relevant Orders   Hemoglobin A1c     Other   Hypercholesteremia    Not on statin Recheck FLP and CMP      Relevant Orders   Lipid panel   CMP (Comprehensive metabolic panel)   Blood glucose elevated    Recheck A1c      Relevant Orders   Hemoglobin A1c   Overweight    Discussed importance of healthy weight management Discussed diet and exercise       Relevant Orders   Lipid panel   TSH   Hemoglobin A1c   CMP (Comprehensive metabolic panel)   CBC w/Diff/Platelet    Other Visit Diagnoses    Encounter for annual physical exam    -  Primary   Relevant Orders   Lipid panel   TSH   Hemoglobin A1c   CMP (Comprehensive metabolic panel)   CBC w/Diff/Platelet   HIV antibody (with reflex)   Screening for HIV (human immunodeficiency virus)       Relevant Orders   HIV antibody (with reflex)       Return in  about 1 year (around 04/27/2020) for CPE.   The entirety of the information documented in the History of Present Illness, Review of Systems and Physical Exam were personally obtained by me. Portions of this information were initially documented by Lynford Humphrey , CMA and reviewed by me for thoroughness and accuracy.    Bacigalupo, Dionne Bucy, MD MPH [facility]

## 2019-04-28 NOTE — Assessment & Plan Note (Signed)
Discussed importance of healthy weight management Discussed diet and exercise  

## 2019-04-28 NOTE — Assessment & Plan Note (Signed)
Not on statin Recheck FLP and CMP

## 2019-04-29 LAB — CBC WITH DIFFERENTIAL/PLATELET
Basophils Absolute: 0 10*3/uL (ref 0.0–0.2)
Basos: 0 %
EOS (ABSOLUTE): 0 10*3/uL (ref 0.0–0.4)
Eos: 0 %
Hematocrit: 37.1 % (ref 34.0–46.6)
Hemoglobin: 12.5 g/dL (ref 11.1–15.9)
Immature Grans (Abs): 0 10*3/uL (ref 0.0–0.1)
Immature Granulocytes: 0 %
Lymphocytes Absolute: 3 10*3/uL (ref 0.7–3.1)
Lymphs: 26 %
MCH: 29.1 pg (ref 26.6–33.0)
MCHC: 33.7 g/dL (ref 31.5–35.7)
MCV: 87 fL (ref 79–97)
Monocytes Absolute: 0.6 10*3/uL (ref 0.1–0.9)
Monocytes: 5 %
Neutrophils Absolute: 8.1 10*3/uL — ABNORMAL HIGH (ref 1.4–7.0)
Neutrophils: 69 %
Platelets: 470 10*3/uL — ABNORMAL HIGH (ref 150–450)
RBC: 4.29 x10E6/uL (ref 3.77–5.28)
RDW: 12.7 % (ref 11.7–15.4)
WBC: 11.8 10*3/uL — ABNORMAL HIGH (ref 3.4–10.8)

## 2019-04-29 LAB — HEMOGLOBIN A1C
Est. average glucose Bld gHb Est-mCnc: 108 mg/dL
Hgb A1c MFr Bld: 5.4 % (ref 4.8–5.6)

## 2019-04-29 LAB — COMPREHENSIVE METABOLIC PANEL
ALT: 19 IU/L (ref 0–32)
AST: 14 IU/L (ref 0–40)
Albumin/Globulin Ratio: 1.8 (ref 1.2–2.2)
Albumin: 4.4 g/dL (ref 3.8–4.8)
Alkaline Phosphatase: 72 IU/L (ref 39–117)
BUN/Creatinine Ratio: 24 — ABNORMAL HIGH (ref 9–23)
BUN: 12 mg/dL (ref 6–24)
Bilirubin Total: 0.3 mg/dL (ref 0.0–1.2)
CO2: 21 mmol/L (ref 20–29)
Calcium: 9.4 mg/dL (ref 8.7–10.2)
Chloride: 103 mmol/L (ref 96–106)
Creatinine, Ser: 0.51 mg/dL — ABNORMAL LOW (ref 0.57–1.00)
GFR calc Af Amer: 131 mL/min/{1.73_m2} (ref >59)
GFR calc non Af Amer: 113 mL/min/{1.73_m2} (ref >59)
Globulin, Total: 2.5 g/dL (ref 1.5–4.5)
Glucose: 86 mg/dL (ref 65–99)
Potassium: 4.4 mmol/L (ref 3.5–5.2)
Sodium: 139 mmol/L (ref 134–144)
Total Protein: 6.9 g/dL (ref 6.0–8.5)

## 2019-04-29 LAB — HIV ANTIBODY (ROUTINE TESTING W REFLEX): HIV Screen 4th Generation wRfx: NONREACTIVE

## 2019-04-29 LAB — LIPID PANEL
Chol/HDL Ratio: 3.2 ratio (ref 0.0–4.4)
Cholesterol, Total: 229 mg/dL — ABNORMAL HIGH (ref 100–199)
HDL: 71 mg/dL (ref >39)
LDL Chol Calc (NIH): 138 mg/dL — ABNORMAL HIGH (ref 0–99)
Triglycerides: 113 mg/dL (ref 0–149)
VLDL Cholesterol Cal: 20 mg/dL (ref 5–40)

## 2019-04-29 LAB — TSH: TSH: 0.831 u[IU]/mL (ref 0.450–4.500)

## 2019-04-30 ENCOUNTER — Telehealth: Payer: Self-pay

## 2019-04-30 NOTE — Telephone Encounter (Signed)
-----   Message from Virginia Crews, MD sent at 04/30/2019  3:07 PM EST ----- White blood cells and platelets remain slightly elevated, near the patient's baseline.  This is been stable for several years.  Normal kidney function, liver function, electrolytes, blood sugar, thyroid function.  HIV screening negative.  Cholesterol remains slightly elevated.  No medication needed at this time, but I do recommend diet low in saturated fat and regular exercise - 30 min at least 5 times per week

## 2019-04-30 NOTE — Telephone Encounter (Signed)
Patient advised as below.  

## 2019-12-04 ENCOUNTER — Telehealth: Payer: Self-pay

## 2019-12-04 NOTE — Telephone Encounter (Signed)
Pt calling; nipple left breast is inverted; appt c PH or what is next step?  [phone]  Adv pt to be seen.  Tx'd to [facility] for scheduling.

## 2019-12-23 ENCOUNTER — Encounter: Payer: Self-pay | Admitting: Obstetrics & Gynecology

## 2019-12-23 ENCOUNTER — Ambulatory Visit (INDEPENDENT_AMBULATORY_CARE_PROVIDER_SITE_OTHER): Payer: Managed Care, Other (non HMO) | Admitting: Obstetrics & Gynecology

## 2019-12-23 ENCOUNTER — Other Ambulatory Visit: Payer: Self-pay

## 2019-12-23 VITALS — BP 130/90 | Ht 64.0 in | Wt 179.0 lb

## 2019-12-23 DIAGNOSIS — N6459 Other signs and symptoms in breast: Secondary | ICD-10-CM

## 2019-12-23 DIAGNOSIS — N6002 Solitary cyst of left breast: Secondary | ICD-10-CM | POA: Diagnosis not present

## 2019-12-23 NOTE — Patient Instructions (Signed)
Thank you for choosing [facility]. As part of our ongoing efforts to improve patient experience, we would appreciate your feedback. Please fill out the short survey that you will receive by mail or MyChart. Your opinion is important to us! -Dr Hawley Michel  

## 2019-12-23 NOTE — Progress Notes (Signed)
HPI:      [name] is a 49 y.o. G1P1 who is perimenopausal, presents today for a problem visit.  She complains of inverted nipple on the leftside which she first noticed 2 mos ago, intermittent in nature and no pain or other skin changes.  It has not significantly changed.  Associated symptoms include none.  Denies nipple discharge or skin changes.  No fever.  Prior Mammogram: 02/2019, 2 left sided cysts, considered benign at the time. Prior breast problems: No Family History: Breast Cancer-relatedfamily history includes Breast cancer in her maternal grandmother.She was age 28s when diagnosed.  Also, has decided to stop OCPs.  Desires conversation as to knowing if she could get pregnant (does not want).  PMHx: She  has a past medical history of Cardiac disease, Diffuse cystic mastopathy, Family history of malignant neoplasm of breast, Graves disease, and Hyperthyroidism. Also,  has a past surgical history that includes Orbital reconstruction (Left, 2008); eyelid surgery ; Wisdom tooth extraction; Dilation and curettage of uterus (2011); Breast biopsy (2008); and Breast biopsy (Left, 2/13 )., family history includes Breast cancer in her maternal grandmother; Diabetes in her mother and sister; Hyperlipidemia in her father, mother, sister, sister, and sister; Hypertension in her father, mother, sister, sister, and sister; Hypothyroidism in her father; Prostate cancer in her father; Stroke in her father.,  reports that she has never smoked. She has never used smokeless tobacco. She reports current alcohol use. She reports that she does not use drugs.  She has a current medication list which includes the following prescription(s): acetaminophen, calcium-vitamin d, cranberry, fiber, fish oil-omega-3 fatty acids, garlic oil, ibuprofen, ibuprofen-diphenhydramine cit, multiple vitamin, and vitamin b-12. Also, has No Known Allergies.  Review of Systems  All other systems reviewed and are  negative.   Objective: BP 130/90   Ht 5\' 4"  (1.626 m)   Wt 179 lb (81.2 kg)   LMP 12/18/2019   BMI 30.73 kg/m  Physical Exam Constitutional:      General: She is not in acute distress.    Appearance: Normal appearance. She is well-developed.  Cardiovascular:     Rate and Rhythm: Normal rate and regular rhythm.     Pulses: Normal pulses.     Heart sounds: Normal heart sounds. No murmur heard.  No friction rub. No gallop.   Pulmonary:     Effort: Pulmonary effort is normal.     Breath sounds: Normal breath sounds.  Chest:     Chest wall: No mass, tenderness or edema.     Breasts:        Right: No inverted nipple, mass, nipple discharge, skin change or tenderness.        Left: No inverted nipple, mass, nipple discharge, skin change or tenderness.     Comments: Sl inversion of left nipple, easily able to return to normal by palpation No mass.  NT Musculoskeletal:        General: Normal range of motion.  Lymphadenopathy:     Upper Body:     Right upper body: No pectoral adenopathy.     Left upper body: No pectoral adenopathy.  Neurological:     Mental Status: She is alert and oriented to person, place, and time.  Skin:    General: Skin is warm and dry.     Findings: No abrasion, bruising, erythema, lesion or rash.  Psychiatric:        Speech: Speech normal.        Behavior: Behavior normal.  Judgment: Judgment normal.  Vitals reviewed.     ASSESSMENT/PLAN: - New breast concerns 1. Inverted nipple 2. Breast cyst, left - Since she had cysts on US/MMG last Sept and now concerns over inverted nipple, will get breast specialist second opinion - Ambulatory referral to General Surgery  3. OCP stoppage.  Test hormones for fertility concerns.  Alternative BC discussed.  Condoms or Abstinance until can get tested after >6 weeks off of pill. Plan lab testing at Annual in 6-7 weeks.  Barnett Applebaum, MD, [facility], [facility] 12/23/2019  2:09  PM

## 2020-01-09 LAB — HM MAMMOGRAPHY

## 2020-01-13 ENCOUNTER — Other Ambulatory Visit: Payer: Self-pay | Admitting: Family Medicine

## 2020-01-13 ENCOUNTER — Ambulatory Visit: Payer: Managed Care, Other (non HMO) | Admitting: General Surgery

## 2020-01-18 HISTORY — PX: BREAST BIOPSY: SHX20

## 2020-01-22 ENCOUNTER — Ambulatory Visit: Payer: Managed Care, Other (non HMO) | Admitting: General Surgery

## 2020-01-29 ENCOUNTER — Other Ambulatory Visit: Payer: Self-pay

## 2020-01-29 ENCOUNTER — Ambulatory Visit (INDEPENDENT_AMBULATORY_CARE_PROVIDER_SITE_OTHER): Payer: Managed Care, Other (non HMO) | Admitting: General Surgery

## 2020-01-29 ENCOUNTER — Encounter: Payer: Self-pay | Admitting: General Surgery

## 2020-01-29 VITALS — BP 129/84 | HR 101 | Temp 98.8°F | Resp 12 | Ht 64.0 in | Wt 180.0 lb

## 2020-01-29 DIAGNOSIS — D242 Benign neoplasm of left breast: Secondary | ICD-10-CM

## 2020-01-29 NOTE — Patient Instructions (Addendum)
We will call you in December 2021 to schedule the Mammogram and follow up appointment for January 2021.  Please call our office if you have any questions or concerns.

## 2020-01-29 NOTE — Progress Notes (Signed)
Patient ID: Beth [name], female   DOB: [birth year], 49 y.o.   MRN: [id number]  Chief Complaint  Patient presents with  . Follow-up    Left nipple inversion, left breast cyst-left breast bx done 01/26/20    HPI Beth [name] is a 49 y.o. female.   She has been referred by her OB/GYN, Dr. Kenton Kingfisher, for further evaluation of left nipple inversion. Ms. Bluett states that her husband actually noticed some nipple changes on the left and felt a small nodule or mass in the periareolar area. She was not quite due for her mammogram at the time so this was expedited.  MAMMOGRAM:  There are faint amorphous calcifications in the immediate retroareolar anterior depth left breast. Unchanged subdermal oval mass in the left lower inner quadrant. Bilateral nipple inversion, with possible left nipple retraction (increasing over time). There are otherwise no suspicious masses, malignant type calcifications, architectural distortion or concerning asymmetries in either breast.   TARGETED ULTRASOUND:  In the retroareolar anterior depth, there is a 1.5 x 0.7 x 1.3 cm oval, parallel, heterogeneous mass. There is an additional similar appearing heterogeneous oval and parallel mass at the 6:00 position, approximately 3 cm from the nipple which measures 1.6 x 0.6 x 2.3 cm; this larger mass is demonstrated to be mammographically stable over time consistent with benign etiology. Incidental note of adjacent simple benign cyst.   Because of the findings on mammogram, a biopsy was performed.  Final Diagnosis   A: Breast, left, core biopsy -Sclerosing intraductal papilloma with apocrine metaplasia -No atypia, in situ or invasive carcinoma identified  She is here today to discuss these results and further planning. She states that she did not notice a mass; her husband found it. She denies any nipple discharge. She did notice some slight retraction of the left nipple, but no other skin changes. She does not recall her age at  menarche. She is still having menstrual cycles. She had one pregnancy at age 69 that ended in miscarriage. She did use oral contraceptive pills, but is not certain of the duration. Her maternal grandmother developed breast cancer in her 37s. Her father had prostate cancer.   Past Medical History:  Diagnosis Date  . Cardiac disease   . Diffuse cystic mastopathy   . Family history of malignant neoplasm of breast   . Graves disease   . Hyperthyroidism     Past Surgical History:  Procedure Laterality Date  . BREAST BIOPSY  2008  . BREAST BIOPSY Left 2/13    aspiration cyst  . DILATION AND CURETTAGE OF UTERUS  2011  . eyelid surgery     . ORBITAL RECONSTRUCTION Left 2008  . WISDOM TOOTH EXTRACTION      Family History  Problem Relation Age of Onset  . Diabetes Mother   . Hypertension Mother   . Hyperlipidemia Mother   . Stroke Father   . Hypertension Father   . Hyperlipidemia Father   . Hypothyroidism Father   . Prostate cancer Father   . Hyperlipidemia Sister   . Hypertension Sister   . Hyperlipidemia Sister   . Hypertension Sister   . Diabetes Sister   . Hyperlipidemia Sister   . Hypertension Sister   . Breast cancer Maternal Grandmother     Social History Social History   Tobacco Use  . Smoking status: Never Smoker  . Smokeless tobacco: Never Used  Vaping Use  . Vaping Use: Never used  Substance Use Topics  . Alcohol use: Yes  Comment: occasional  . Drug use: No    No Known Allergies  Current Outpatient Medications  Medication Sig Dispense Refill  . acetaminophen (TYLENOL 8 HOUR) 650 MG CR tablet     . calcium-vitamin D (OSCAL WITH D) 500-200 MG-UNIT tablet Take 1 tablet by mouth.    . CRANBERRY PO Take 2 tablets by mouth 2 (two) times daily.    Marland Kitchen FIBER PO Take 1 each by mouth daily. Gummy    . fish oil-omega-3 fatty acids 1000 MG capsule Take 2 g by mouth daily.    . Garlic Oil 8413 MG CAPS Take by mouth.    . Ibuprofen (ADVIL) 200 MG CAPS Take by  mouth as needed.    . Ibuprofen-Diphenhydramine Cit (ADVIL PM PO) Take 1 tablet by mouth as needed.    . Ibuprofen-diphenhydrAMINE Cit 200-38 MG TABS Take 1 tablet by mouth daily as needed.    . Multiple Vitamins-Calcium (ONE-A-DAY WOMENS FORMULA) TABS Take by mouth.    . Multiple Vitamins-Minerals (MULTIVITAMIN PO) Take by mouth.    . vitamin B-12 (CYANOCOBALAMIN) 100 MCG tablet Take 100 mcg by mouth daily.     No current facility-administered medications for this visit.    Review of Systems Review of Systems  All other systems reviewed and are negative.   Blood pressure 129/84, pulse (!) 101, temperature 98.8 F (37.1 C), temperature source Oral, resp. rate 12, height 5\' 4"  (1.626 m), weight 180 lb (81.6 kg), SpO2 98 %. Body mass index is 30.9 kg/m.  Physical Exam Physical Exam Constitutional:      General: She is not in acute distress.    Appearance: Normal appearance.  HENT:     Head: Normocephalic and atraumatic.     Nose:     Comments: Covered with a mask    Mouth/Throat:     Comments: Covered with a mask Eyes:     General: No scleral icterus.       Right eye: No discharge.        Left eye: No discharge.     Comments: Orbital changes consistent with decompression surgery.  Neck:     Comments: No palpable cervical or supraclavicular lymphadenopathy. The trachea is midline. I am unable to palpate the thyroid. Cardiovascular:     Rate and Rhythm: Regular rhythm. Tachycardia present.  Pulmonary:     Effort: Pulmonary effort is normal. No respiratory distress.     Breath sounds: Normal breath sounds.  Chest:     Breasts:        Right: Inverted nipple present. No nipple discharge or tenderness.        Left: Inverted nipple and skin change present. No nipple discharge or tenderness.       Comments: Slight skin dimple at the left nipple Abdominal:     General: Bowel sounds are normal.     Palpations: Abdomen is soft.  Genitourinary:    Comments:  Deferred Musculoskeletal:        General: No swelling or tenderness.  Lymphadenopathy:     Upper Body:     Right upper body: No supraclavicular, axillary or pectoral adenopathy.     Left upper body: No supraclavicular, axillary or pectoral adenopathy.  Skin:    General: Skin is warm and dry.  Neurological:     General: No focal deficit present.     Mental Status: She is alert and oriented to person, place, and time.  Psychiatric:        Mood and  Affect: Mood normal.        Behavior: Behavior normal.     Data Reviewed I reviewed the radiographic imaging as discussed above under the history of present illness. I also reviewed the pathology, as in the history of present illness.  There is also a clinic note from January 23, 2020 at the breast care center at [facility] where and the decision to biopsy the lesion seen on mammography was made.  Assessment This is a 49 year old woman who has bilateral inverted nipples but noticed some additional pulling or skin change on the left, associated with a palpable finding discovered by her husband. Although I only appreciate a slight thickening posterior to the areola, a biopsy of the site was performed and was consistent with an intraductal papilloma. I discussed with the patient that, depending on the specific study, the literature cites anywhere from a 1.7 to 20% risk of upstaging to ductal carcinoma in situ or invasive breast cancer when an intraductal papilloma without atypia is identified on core biopsy.  I offered her the option of surgical excision versus close interval follow-up.  She would like to pursue the second option.  I did let her know that if the tumor board at [facility] suggested excision, I was also perfectly comfortable with that and would be happy to assist with facilitating surgery.  Plan She will return in 6 months time with a diagnostic mammogram.  Should she notice any significant changes in her breast, develop nipple discharge, note any  growth to the thickened area/mass, she should contact my office for a sooner appointment to discuss surgery.    [name] 01/29/2020, 11:49 AM

## 2020-02-13 ENCOUNTER — Other Ambulatory Visit: Payer: Self-pay

## 2020-02-13 ENCOUNTER — Encounter: Payer: Self-pay | Admitting: Obstetrics & Gynecology

## 2020-02-13 ENCOUNTER — Ambulatory Visit (INDEPENDENT_AMBULATORY_CARE_PROVIDER_SITE_OTHER): Payer: Managed Care, Other (non HMO) | Admitting: Obstetrics & Gynecology

## 2020-02-13 VITALS — BP 130/80 | Ht 64.0 in | Wt 180.0 lb

## 2020-02-13 DIAGNOSIS — Z780 Asymptomatic menopausal state: Secondary | ICD-10-CM

## 2020-02-13 DIAGNOSIS — Z1231 Encounter for screening mammogram for malignant neoplasm of breast: Secondary | ICD-10-CM

## 2020-02-13 DIAGNOSIS — Z01419 Encounter for gynecological examination (general) (routine) without abnormal findings: Secondary | ICD-10-CM

## 2020-02-13 NOTE — Progress Notes (Signed)
HPI:      [name] is a 49 y.o. G1P1 who LMP was No LMP recorded., she presents today for her annual examination. The patient has no complaints today. The patient is sexually active. Her last pap: approximate date 2019 and was normal and last mammogram: approximate date 2020 and was normal. The patient does perform self breast exams. Recent exam by Dr Beth [name] w plans for Dx MMG in 6 mos for left nipple area of concern. There is notable family history of breast or ovarian cancer in her family.  The patient has regular exercise: yes.  The patient denies current symptoms of depression.    GYN History: Contraception: Stopped OCPs 6 weeks ago, no period.  PMHx: Past Medical History:  Diagnosis Date  . Cardiac disease   . Diffuse cystic mastopathy   . Family history of malignant neoplasm of breast   . Graves disease   . Hyperthyroidism    Past Surgical History:  Procedure Laterality Date  . BREAST BIOPSY  2008  . BREAST BIOPSY Left 2/13    aspiration cyst  . DILATION AND CURETTAGE OF UTERUS  2011  . eyelid surgery     . ORBITAL RECONSTRUCTION Left 2008  . WISDOM TOOTH EXTRACTION     Family History  Problem Relation Age of Onset  . Diabetes Mother   . Hypertension Mother   . Hyperlipidemia Mother   . Stroke Father   . Hypertension Father   . Hyperlipidemia Father   . Hypothyroidism Father   . Prostate cancer Father   . Hyperlipidemia Sister   . Hypertension Sister   . Hyperlipidemia Sister   . Hypertension Sister   . Diabetes Sister   . Hyperlipidemia Sister   . Hypertension Sister   . Breast cancer Maternal Grandmother    Social History   Tobacco Use  . Smoking status: Never Smoker  . Smokeless tobacco: Never Used  Vaping Use  . Vaping Use: Never used  Substance Use Topics  . Alcohol use: Yes    Comment: occasional  . Drug use: No    Current Outpatient Medications:  .  acetaminophen (TYLENOL 8 HOUR) 650 MG CR tablet, , Disp: , Rfl:  .  calcium-vitamin  D (OSCAL WITH D) 500-200 MG-UNIT tablet, Take 1 tablet by mouth., Disp: , Rfl:  .  CRANBERRY PO, Take 2 tablets by mouth 2 (two) times daily., Disp: , Rfl:  .  FIBER PO, Take 1 each by mouth daily. Gummy, Disp: , Rfl:  .  fish oil-omega-3 fatty acids 1000 MG capsule, Take 2 g by mouth daily., Disp: , Rfl:  .  Garlic Oil 2202 MG CAPS, Take by mouth., Disp: , Rfl:  .  Ibuprofen (ADVIL) 200 MG CAPS, Take by mouth as needed., Disp: , Rfl:  .  Ibuprofen-Diphenhydramine Cit (ADVIL PM PO), Take 1 tablet by mouth as needed., Disp: , Rfl:  .  Multiple Vitamins-Calcium (ONE-A-DAY WOMENS FORMULA) TABS, Take by mouth., Disp: , Rfl:  .  vitamin B-12 (CYANOCOBALAMIN) 100 MCG tablet, Take 100 mcg by mouth daily., Disp: , Rfl:  Allergies: Patient has no known allergies.  Review of Systems  Constitutional: Positive for malaise/fatigue. Negative for chills and fever.  HENT: Negative for congestion, sinus pain and sore throat.   Eyes: Negative for blurred vision and pain.  Respiratory: Negative for cough and wheezing.   Cardiovascular: Negative for chest pain and leg swelling.  Gastrointestinal: Negative for abdominal pain, constipation, diarrhea, heartburn, nausea and  vomiting.  Genitourinary: Negative for dysuria, frequency, hematuria and urgency.  Musculoskeletal: Negative for back pain, joint pain, myalgias and neck pain.  Skin: Negative for itching and rash.  Neurological: Negative for dizziness, tremors and weakness.  Endo/Heme/Allergies: Does not bruise/bleed easily.  Psychiatric/Behavioral: Negative for depression. The patient is not nervous/anxious and does not have insomnia.     Objective: BP 130/80   Ht 5\' 4"  (1.626 m)   Wt 180 lb (81.6 kg)   BMI 30.90 kg/m   Filed Weights   02/13/20 0758  Weight: 180 lb (81.6 kg)   Body mass index is 30.9 kg/m. Physical Exam Constitutional:      General: She is not in acute distress.    Appearance: She is well-developed.  Genitourinary:     Pelvic  exam was performed with patient supine.     Vagina, uterus and rectum normal.     No lesions in the vagina.     No vaginal bleeding.     No cervical motion tenderness, friability, lesion or polyp.     Uterus is mobile.     Uterus is not enlarged.     No uterine mass detected.    Uterus is midaxial.     No right or left adnexal mass present.     Right adnexa not tender.     Left adnexa not tender.  HENT:     Head: Normocephalic and atraumatic. No laceration.     Right Ear: Hearing normal.     Left Ear: Hearing normal.     Mouth/Throat:     Pharynx: Uvula midline.  Eyes:     Pupils: Pupils are equal, round, and reactive to light.  Neck:     Thyroid: No thyromegaly.  Cardiovascular:     Rate and Rhythm: Normal rate and regular rhythm.     Heart sounds: No murmur heard.  No friction rub. No gallop.   Pulmonary:     Effort: Pulmonary effort is normal. No respiratory distress.     Breath sounds: Normal breath sounds. No wheezing.  Chest:     Breasts:        Right: No mass, skin change or tenderness.        Left: No mass, skin change or tenderness.  Abdominal:     General: Bowel sounds are normal. There is no distension.     Palpations: Abdomen is soft.     Tenderness: There is no abdominal tenderness. There is no rebound.  Musculoskeletal:        General: Normal range of motion.     Cervical back: Normal range of motion and neck supple.  Neurological:     Mental Status: She is alert and oriented to person, place, and time.     Cranial Nerves: No cranial nerve deficit.  Skin:    General: Skin is warm and dry.  Psychiatric:        Judgment: Judgment normal.  Vitals reviewed.     Assessment:  ANNUAL EXAM 1. Women's annual routine gynecological examination   2. Encounter for screening mammogram for malignant neoplasm of breast   3. Menopause      Screening Plan:            1.  Cervical Screening-  Pap smear schedule reviewed with patient  2. Breast screening- Exam  annually and mammogram>40 planned   3. Colonoscopy every 10 years, Hemoccult testing - after age 61  4. Labs managed by PCP  5. Counseling for contraception:  None now, test [facility] LH E2 today to see if still recommend contraception - FSH/LH - Estradiol      F/U  Return in about 1 year (around 02/12/2021) for Annual.  Barnett Applebaum, MD, [facility], [facility] 02/13/2020  8:19 AM

## 2020-02-13 NOTE — Patient Instructions (Addendum)
PAP every three years Mammogram every year - and in 6 months per Dr Celine Ahr Colonoscopy every 10 years Labs yearly (with PCP)  Thank you for choosing [facility]. As part of our ongoing efforts to improve patient experience, we would appreciate your feedback. Please fill out the short survey that you will receive by mail or MyChart. Your opinion is important to [place]! - Dr. Kenton Kingfisher

## 2020-02-14 LAB — FSH/LH
FSH: 54 m[IU]/mL
LH: 54.3 m[IU]/mL

## 2020-02-14 LAB — ESTRADIOL: Estradiol: 21.8 pg/mL

## 2020-02-24 ENCOUNTER — Telehealth: Payer: Self-pay | Admitting: Obstetrics & Gynecology

## 2020-02-24 NOTE — Telephone Encounter (Signed)
Left message to return call 

## 2020-02-24 NOTE — Telephone Encounter (Signed)
Patient is calling to follow up on her lab results. Please advise. Patient aware [facility]PH is out of office

## 2020-04-28 ENCOUNTER — Encounter: Payer: Self-pay | Admitting: Family Medicine

## 2020-05-18 ENCOUNTER — Encounter: Payer: Self-pay | Admitting: Family Medicine

## 2020-05-18 ENCOUNTER — Ambulatory Visit (INDEPENDENT_AMBULATORY_CARE_PROVIDER_SITE_OTHER): Payer: Managed Care, Other (non HMO) | Admitting: Family Medicine

## 2020-05-18 ENCOUNTER — Other Ambulatory Visit: Payer: Self-pay

## 2020-05-18 VITALS — BP 126/70 | HR 91 | Temp 98.7°F | Resp 16 | Ht 63.0 in | Wt 181.0 lb

## 2020-05-18 DIAGNOSIS — R739 Hyperglycemia, unspecified: Secondary | ICD-10-CM | POA: Diagnosis not present

## 2020-05-18 DIAGNOSIS — Z1159 Encounter for screening for other viral diseases: Secondary | ICD-10-CM

## 2020-05-18 DIAGNOSIS — E7800 Pure hypercholesterolemia, unspecified: Secondary | ICD-10-CM | POA: Diagnosis not present

## 2020-05-18 DIAGNOSIS — Z0000 Encounter for general adult medical examination without abnormal findings: Secondary | ICD-10-CM

## 2020-05-18 DIAGNOSIS — E66811 Obesity, class 1: Secondary | ICD-10-CM

## 2020-05-18 DIAGNOSIS — Z6832 Body mass index (BMI) 32.0-32.9, adult: Secondary | ICD-10-CM

## 2020-05-18 DIAGNOSIS — Z1211 Encounter for screening for malignant neoplasm of colon: Secondary | ICD-10-CM

## 2020-05-18 DIAGNOSIS — E669 Obesity, unspecified: Secondary | ICD-10-CM

## 2020-05-18 NOTE — Progress Notes (Signed)
Complete physical exam   Patient: Beth [name]   DOB: [birth year]   49 y.o. Female  MRN: [id number] Visit Date: 05/18/2020  Today's healthcare provider: Lavon Paganini, MD   Chief Complaint  Patient presents with  . Annual Exam   Subjective    Beth [name] is a 49 y.o. female who presents today for a complete physical exam.  She reports consuming a general diet. Home exercise routine includes walking 1.5 hrs per day. She generally feels well. She reports sleeping well. She does not have additional problems to discuss today.  HPI    Past Medical History:  Diagnosis Date  . Cardiac disease   . Diffuse cystic mastopathy   . Family history of malignant neoplasm of breast   . Graves disease   . Hyperthyroidism    Past Surgical History:  Procedure Laterality Date  . BREAST BIOPSY  2008  . BREAST BIOPSY Left 2/13    aspiration cyst  . BREAST BIOPSY Left   . DILATION AND CURETTAGE OF UTERUS  2011  . eyelid surgery     . ORBITAL RECONSTRUCTION Left 2008  . WISDOM TOOTH EXTRACTION     Social History   Socioeconomic History  . Marital status: Married    Spouse name: Not on file  . Number of children: Not on file  . Years of education: Not on file  . Highest education level: Not on file  Occupational History  . Not on file  Tobacco Use  . Smoking status: Never Smoker  . Smokeless tobacco: Never Used  Vaping Use  . Vaping Use: Never used  Substance and Sexual Activity  . Alcohol use: Yes    Comment: occasional  . Drug use: No  . Sexual activity: Yes    Birth control/protection: None  Other Topics Concern  . Not on file  Social History Narrative  . Not on file   Social Determinants of Health   Financial Resource Strain:   . Difficulty of Paying Living Expenses: Not on file  Food Insecurity:   . Worried About Charity fundraiser in the Last Year: Not on file  . Ran Out of Food in the Last Year: Not on file  Transportation Needs:   . Lack of  Transportation (Medical): Not on file  . Lack of Transportation (Non-Medical): Not on file  Physical Activity:   . Days of Exercise per Week: Not on file  . Minutes of Exercise per Session: Not on file  Stress:   . Feeling of Stress : Not on file  Social Connections:   . Frequency of Communication with Friends and Family: Not on file  . Frequency of Social Gatherings with Friends and Family: Not on file  . Attends Religious Services: Not on file  . Active Member of Clubs or Organizations: Not on file  . Attends Archivist Meetings: Not on file  . Marital Status: Not on file  Intimate Partner Violence:   . Fear of Current or Ex-Partner: Not on file  . Emotionally Abused: Not on file  . Physically Abused: Not on file  . Sexually Abused: Not on file   Family Status  Relation Name Status  . Mother  Alive  . Father  Alive  . Sister  Alive  . Brother  Alive  . Sister  Alive  . Sister  Alive  . MGM  (Not Specified)   Family History  Problem Relation Age of Onset  . Diabetes  Mother   . Hypertension Mother   . Hyperlipidemia Mother   . Stroke Father   . Hypertension Father   . Hyperlipidemia Father   . Hypothyroidism Father   . Prostate cancer Father   . Hyperlipidemia Sister   . Hypertension Sister   . Hyperlipidemia Sister   . Hypertension Sister   . Diabetes Sister   . Hyperlipidemia Sister   . Hypertension Sister   . Breast cancer Maternal Grandmother    No Known Allergies  Patient Care Team: Rudell Ortman, Dionne Bucy, MD as PCP - General (Family Medicine) Christene Lye, MD (General Surgery) Gae Dry, MD as Referring Physician (Obstetrics and Gynecology)   Medications: Outpatient Medications Prior to Visit  Medication Sig  . acetaminophen (TYLENOL 8 HOUR) 650 MG CR tablet   . calcium-vitamin D (OSCAL WITH D) 500-200 MG-UNIT tablet Take 1 tablet by mouth.  . CRANBERRY PO Take 2 tablets by mouth 2 (two) times daily.  Marland Kitchen FIBER PO Take 1 each  by mouth daily. Gummy  . fish oil-omega-3 fatty acids 1000 MG capsule Take 2 g by mouth daily.  . Garlic Oil 9741 MG CAPS Take by mouth.  . Ibuprofen (ADVIL) 200 MG CAPS Take by mouth as needed.  . Ibuprofen-Diphenhydramine Cit (ADVIL PM PO) Take 1 tablet by mouth as needed.  . Multiple Vitamins-Calcium (ONE-A-DAY WOMENS FORMULA) TABS Take by mouth.  . vitamin B-12 (CYANOCOBALAMIN) 100 MCG tablet Take 100 mcg by mouth daily.   No facility-administered medications prior to visit.    Review of Systems  Constitutional: Negative.   HENT: Negative.   Eyes: Negative.   Respiratory: Negative.   Cardiovascular: Negative.   Gastrointestinal: Negative.   Endocrine: Negative.   Genitourinary: Negative.   Musculoskeletal: Negative.   Skin: Negative.   Allergic/Immunologic: Negative.   Neurological: Negative.   Hematological: Negative.   Psychiatric/Behavioral: Negative.     Last CBC Lab Results  Component Value Date   WBC 11.8 (H) 04/28/2019   HGB 12.5 04/28/2019   HCT 37.1 04/28/2019   MCV 87 04/28/2019   MCH 29.1 04/28/2019   RDW 12.7 04/28/2019   PLT 470 (H) 63/84/5364   Last metabolic panel Lab Results  Component Value Date   GLUCOSE 86 04/28/2019   NA 139 04/28/2019   K 4.4 04/28/2019   CL 103 04/28/2019   CO2 21 04/28/2019   BUN 12 04/28/2019   CREATININE 0.51 (L) 04/28/2019   GFRNONAA 113 04/28/2019   GFRAA 131 04/28/2019   CALCIUM 9.4 04/28/2019   PROT 6.9 04/28/2019   ALBUMIN 4.4 04/28/2019   LABGLOB 2.5 04/28/2019   AGRATIO 1.8 04/28/2019   BILITOT 0.3 04/28/2019   ALKPHOS 72 04/28/2019   AST 14 04/28/2019   ALT 19 04/28/2019   ANIONGAP 8 02/05/2019   Last lipids Lab Results  Component Value Date   CHOL 229 (H) 04/28/2019   HDL 71 04/28/2019   LDLCALC 138 (H) 04/28/2019   TRIG 113 04/28/2019   CHOLHDL 3.2 04/28/2019   Last hemoglobin A1c Lab Results  Component Value Date   HGBA1C 5.4 04/28/2019   Last thyroid functions Lab Results  Component  Value Date   TSH 0.831 04/28/2019   Last vitamin D Lab Results  Component Value Date   VD25OH 51.3 01/29/2017   Last vitamin B12 and Folate Lab Results  Component Value Date   VITAMINB12 538 01/19/2016      Objective    BP 126/70 (BP Location: Left Arm, Patient Position: Sitting,  Cuff Size: Large)   Pulse 91   Temp 98.7 F (37.1 C) (Oral)   Resp 16   Ht 5\' 3"  (1.6 m)   Wt 181 lb (82.1 kg)   SpO2 99%   BMI 32.06 kg/m  BP Readings from Last 3 Encounters:  05/18/20 126/70  02/13/20 130/80  01/29/20 129/84   Wt Readings from Last 3 Encounters:  05/18/20 181 lb (82.1 kg)  02/13/20 180 lb (81.6 kg)  01/29/20 180 lb (81.6 kg)      Physical Exam Vitals reviewed.  Constitutional:      General: She is not in acute distress.    Appearance: Normal appearance. She is well-developed. She is not diaphoretic.  HENT:     Head: Normocephalic and atraumatic.     Right Ear: Tympanic membrane, ear canal and external ear normal.     Left Ear: Tympanic membrane, ear canal and external ear normal.  Eyes:     General: No scleral icterus.    Conjunctiva/sclera: Conjunctivae normal.     Pupils: Pupils are equal, round, and reactive to light.  Neck:     Thyroid: No thyromegaly.  Cardiovascular:     Rate and Rhythm: Normal rate and regular rhythm.     Pulses: Normal pulses.     Heart sounds: Normal heart sounds. No murmur heard.   Pulmonary:     Effort: Pulmonary effort is normal. No respiratory distress.     Breath sounds: Normal breath sounds. No wheezing or rales.  Abdominal:     General: There is no distension.     Palpations: Abdomen is soft.     Tenderness: There is no abdominal tenderness.  Musculoskeletal:        General: No deformity.     Cervical back: Neck supple.     Right lower leg: No edema.     Left lower leg: No edema.  Lymphadenopathy:     Cervical: No cervical adenopathy.  Skin:    General: Skin is warm and dry.     Findings: No rash.  Neurological:      Mental Status: She is alert and oriented to person, place, and time. Mental status is at baseline.     Sensory: No sensory deficit.     Motor: No weakness.     Gait: Gait normal.  Psychiatric:        Mood and Affect: Mood normal.        Behavior: Behavior normal.        Thought Content: Thought content normal.       Last depression screening scores PHQ 2/9 Scores 05/18/2020 04/28/2019 04/24/2018  PHQ - 2 Score 1 2 0  PHQ- 9 Score 3 3 -   Last fall risk screening Fall Risk  05/18/2020  Falls in the past year? 0  Number falls in past yr: 0  Injury with Fall? 0  Risk for fall due to : No Fall Risks  Follow up Falls evaluation completed   Last Audit-C alcohol use screening Alcohol Use Disorder Test (AUDIT) 05/18/2020  1. How often do you have a drink containing alcohol? 1  2. How many drinks containing alcohol do you have on a typical day when you are drinking? 0  3. How often do you have six or more drinks on one occasion? 0  AUDIT-C Score 1  Alcohol Brief Interventions/Follow-up AUDIT Score <7 follow-up not indicated   A score of 3 or more in women, and 4 or more in men indicates  increased risk for alcohol abuse, EXCEPT if all of the points are from question 1   No results found for any visits on 05/18/20.  Assessment & Plan    Routine Health Maintenance and Physical Exam  Exercise Activities and Dietary recommendations Goals    . Exercise 150 minutes per week (moderate activity)       Immunization History  Administered Date(s) Administered  . Influenza-Unspecified 04/05/2017, 03/19/2020  . Moderna SARS-COVID-2 Vaccination 09/27/2019, 10/27/2019  . Td 04/24/2018  . Tdap 01/31/2008    Health Maintenance  Topic Date Due  . Hepatitis C Screening  Never done  . COLONOSCOPY  Never done  . PAP SMEAR-Modifier  02/21/2021  . MAMMOGRAM  03/04/2021  . TETANUS/TDAP  04/24/2028  . INFLUENZA VACCINE  Completed  . COVID-19 Vaccine  Completed  . HIV Screening  Completed      Discussed health benefits of physical activity, and encouraged her to engage in regular exercise appropriate for her age and condition.  Problem List Items Addressed This Visit      Other   Hypercholesteremia   Relevant Orders   Lipid Panel With LDL/HDL Ratio   Blood glucose elevated   Relevant Orders   Hemoglobin A1c   Obesity    Discussed importance of healthy weight management Discussed diet and exercise       Relevant Orders   TSH   Lipid Panel With LDL/HDL Ratio    Other Visit Diagnoses    Encounter for annual physical exam    -  Primary   Relevant Orders   CBC with Differential/Platelet   Comprehensive metabolic panel   Hemoglobin A1c   TSH   Lipid Panel With LDL/HDL Ratio   Need for hepatitis C screening test       Relevant Orders   Hepatitis C antibody   Screen for colon cancer       Relevant Orders   Ambulatory referral to Gastroenterology       Return in about 1 year (around 05/18/2021) for CPE.     I, Lavon Paganini, MD, have reviewed all documentation for this visit. The documentation on 05/18/20 for the exam, diagnosis, procedures, and orders are all accurate and complete.   Shadman Tozzi, Dionne Bucy, MD, MPH [facility]

## 2020-05-18 NOTE — Assessment & Plan Note (Signed)
Discussed importance of healthy weight management Discussed diet and exercise  

## 2020-05-18 NOTE — Patient Instructions (Signed)

## 2020-05-19 ENCOUNTER — Telehealth: Payer: Self-pay

## 2020-05-19 LAB — COMPREHENSIVE METABOLIC PANEL
ALT: 23 IU/L (ref 0–32)
AST: 14 IU/L (ref 0–40)
Albumin/Globulin Ratio: 2 (ref 1.2–2.2)
Albumin: 4.8 g/dL (ref 3.8–4.8)
Alkaline Phosphatase: 124 IU/L — ABNORMAL HIGH (ref 44–121)
BUN/Creatinine Ratio: 24 — ABNORMAL HIGH (ref 9–23)
BUN: 13 mg/dL (ref 6–24)
Bilirubin Total: 0.4 mg/dL (ref 0.0–1.2)
CO2: 22 mmol/L (ref 20–29)
Calcium: 9.4 mg/dL (ref 8.7–10.2)
Chloride: 101 mmol/L (ref 96–106)
Creatinine, Ser: 0.55 mg/dL — ABNORMAL LOW (ref 0.57–1.00)
GFR calc Af Amer: 126 mL/min/{1.73_m2} (ref >59)
GFR calc non Af Amer: 110 mL/min/{1.73_m2} (ref >59)
Globulin, Total: 2.4 g/dL (ref 1.5–4.5)
Glucose: 100 mg/dL — ABNORMAL HIGH (ref 65–99)
Potassium: 4.3 mmol/L (ref 3.5–5.2)
Sodium: 138 mmol/L (ref 134–144)
Total Protein: 7.2 g/dL (ref 6.0–8.5)

## 2020-05-19 LAB — CBC WITH DIFFERENTIAL/PLATELET
Basophils Absolute: 0 10*3/uL (ref 0.0–0.2)
Basos: 0 %
EOS (ABSOLUTE): 0.1 10*3/uL (ref 0.0–0.4)
Eos: 1 %
Hematocrit: 41.7 % (ref 34.0–46.6)
Hemoglobin: 13.8 g/dL (ref 11.1–15.9)
Immature Grans (Abs): 0 10*3/uL (ref 0.0–0.1)
Immature Granulocytes: 0 %
Lymphocytes Absolute: 3.1 10*3/uL (ref 0.7–3.1)
Lymphs: 24 %
MCH: 29.4 pg (ref 26.6–33.0)
MCHC: 33.1 g/dL (ref 31.5–35.7)
MCV: 89 fL (ref 79–97)
Monocytes Absolute: 0.8 10*3/uL (ref 0.1–0.9)
Monocytes: 6 %
Neutrophils Absolute: 9 10*3/uL — ABNORMAL HIGH (ref 1.4–7.0)
Neutrophils: 69 %
Platelets: 389 10*3/uL (ref 150–450)
RBC: 4.7 x10E6/uL (ref 3.77–5.28)
RDW: 13.1 % (ref 11.7–15.4)
WBC: 13 10*3/uL — ABNORMAL HIGH (ref 3.4–10.8)

## 2020-05-19 LAB — LIPID PANEL WITH LDL/HDL RATIO
Cholesterol, Total: 210 mg/dL — ABNORMAL HIGH (ref 100–199)
HDL: 68 mg/dL (ref >39)
LDL Chol Calc (NIH): 131 mg/dL — ABNORMAL HIGH (ref 0–99)
LDL/HDL Ratio: 1.9 ratio (ref 0.0–3.2)
Triglycerides: 61 mg/dL (ref 0–149)
VLDL Cholesterol Cal: 11 mg/dL (ref 5–40)

## 2020-05-19 LAB — HEPATITIS C ANTIBODY: Hep C Virus Ab: <0.1 s/co ratio (ref 0.0–0.9)

## 2020-05-19 LAB — HEMOGLOBIN A1C
Est. average glucose Bld gHb Est-mCnc: 114 mg/dL
Hgb A1c MFr Bld: 5.6 % (ref 4.8–5.6)

## 2020-05-19 LAB — TSH: TSH: 0.699 u[IU]/mL (ref 0.450–4.500)

## 2020-05-19 NOTE — Telephone Encounter (Signed)
-----  Message from Virginia Crews, MD sent at 05/19/2020 10:07 AM EST ----- Normal/stable labs, except for slight increase in alk phos, a liver enzyme.  This is pretty nonspecific and other liver enzymes are normal.  Recommend hydrating well, decreasing Tylenol or alcohol intake if you are using any, and repeating liver function tests in 1 to 2 months.  Cholesterol is stable.  The 10-year ASCVD (heart disease and stroke) risk score Mikey Bussing DC Jr., et al., 2013) is: 1%.  This is low, so no medication needed at this time.  I do recommend diet low in saturated fat and regular exercise - 30 min at least 5 times per week.

## 2020-05-19 NOTE — Telephone Encounter (Signed)
Written by Virginia Crews, MD on 05/19/2020 10:07 AM EST View Full Comments Seen by patient Beth [name] on 05/19/2020 11:29 AM

## 2020-05-20 ENCOUNTER — Encounter: Payer: Self-pay | Admitting: Family Medicine

## 2020-05-24 ENCOUNTER — Other Ambulatory Visit: Payer: Self-pay

## 2020-05-24 ENCOUNTER — Telehealth (INDEPENDENT_AMBULATORY_CARE_PROVIDER_SITE_OTHER): Payer: Self-pay | Admitting: Gastroenterology

## 2020-05-24 DIAGNOSIS — Z1211 Encounter for screening for malignant neoplasm of colon: Secondary | ICD-10-CM

## 2020-05-24 MED ORDER — NA SULFATE-K SULFATE-MG SULF 17.5-3.13-1.6 GM/177ML PO SOLN: 1.0000 | Freq: Once | ORAL | 0 refills | Status: AC

## 2020-05-24 NOTE — Progress Notes (Signed)
Gastroenterology Pre-Procedure Review  Request Date: Monday 07/12/20 Requesting Physician: Dr. Allen Norris  PATIENT REVIEW QUESTIONS: The patient responded to the following health history questions as indicated:    1. Are you having any GI issues? no 2. Do you have a personal history of Polyps? no 3. Do you have a family history of Colon Cancer or Polyps? yes (Mother colon polyps) 4. Diabetes Mellitus? no 5. Joint replacements in the past 12 months?Breast biopsy 2021 6. Major health problems in the past 3 months?no 7. Any artificial heart valves, MVP, or defibrillator?no    MEDICATIONS & ALLERGIES:    Patient reports the following regarding taking any anticoagulation/antiplatelet therapy:   Plavix, Coumadin, Eliquis, Xarelto, Lovenox, Pradaxa, Brilinta, or Effient? no Aspirin? no  Patient confirms/reports the following medications:  Current Outpatient Medications  Medication Sig Dispense Refill  . acetaminophen (TYLENOL 8 HOUR) 650 MG CR tablet     . calcium-vitamin D (OSCAL WITH D) 500-200 MG-UNIT tablet Take 1 tablet by mouth.    . CRANBERRY PO Take 2 tablets by mouth 2 (two) times daily.    Marland Kitchen FIBER PO Take 1 each by mouth daily. Gummy    . fish oil-omega-3 fatty acids 1000 MG capsule Take 2 g by mouth daily.    . Garlic Oil 6728 MG CAPS Take by mouth.    . Ibuprofen (ADVIL) 200 MG CAPS Take by mouth as needed.    . Ibuprofen-Diphenhydramine Cit (ADVIL PM PO) Take 1 tablet by mouth as needed.    . Multiple Vitamins-Calcium (ONE-A-DAY WOMENS FORMULA) TABS Take by mouth.    . vitamin B-12 (CYANOCOBALAMIN) 100 MCG tablet Take 100 mcg by mouth daily.    . Na Sulfate-K Sulfate-Mg Sulf 17.5-3.13-1.6 GM/177ML SOLN Take 1 kit by mouth once for 1 dose. 354 mL 0   No current facility-administered medications for this visit.    Patient confirms/reports the following allergies:  No Known Allergies  Orders Placed This Encounter  Procedures  . Procedural/ Surgical Case Request: COLONOSCOPY  WITH PROPOFOL    Standing Status:   Standing    Number of Occurrences:   1    Order Specific Question:   Pre-op diagnosis    Answer:   Screening colonoscopy    Order Specific Question:   CPT Code    Answer:   97915    AUTHORIZATION INFORMATION Primary Insurance: 1D#: Group #:  Secondary Insurance: 1D#: Group #:  SCHEDULE INFORMATION: Date: Monday 07/12/20 Time: Location:[facility]

## 2020-05-26 ENCOUNTER — Telehealth: Payer: Self-pay

## 2020-05-26 DIAGNOSIS — R748 Abnormal levels of other serum enzymes: Secondary | ICD-10-CM

## 2020-05-26 NOTE — Telephone Encounter (Signed)
Copied from [facility] [phone]. Topic: Quick Communication - Lab Results (Clinic Use ONLY) >> May 26, 2020 12:45 PM Lennox Solders wrote: Pt is calling and would like blood work results from 05-18-2020

## 2020-05-26 NOTE — Telephone Encounter (Signed)
LMTCB 05/26/2020.  PEC please advise pt of lab results dated 05/18/2020 when she calls back.   Thanks,   -Mickel Baas

## 2020-05-27 NOTE — Telephone Encounter (Signed)
Pt returned call and I gave her the message regarding her lab results from 05/18/2020.  No questions at this time.  I gave her the lab hours and that she did not need an appt. The lipid panel order needs to be entered.   Thanks.

## 2020-05-27 NOTE — Addendum Note (Signed)
Addended by: Shawna Orleans on: 05/27/2020 02:57 PM   Modules accepted: Orders

## 2020-05-27 NOTE — Telephone Encounter (Signed)
Attempted to call pt.  Left vm to return call to office to discuss lab results.  Noted that pt. Has viewed results on MyChart on 05/19/20.

## 2020-06-17 ENCOUNTER — Other Ambulatory Visit: Payer: Self-pay | Admitting: *Deleted

## 2020-06-17 ENCOUNTER — Inpatient Hospital Stay
Admission: RE | Admit: 2020-06-17 | Discharge: 2020-06-17 | Disposition: A | Discharge status: Home / Self Care | Payer: Self-pay | Source: Ambulatory Visit | Attending: *Deleted | Admitting: *Deleted

## 2020-06-17 ENCOUNTER — Other Ambulatory Visit: Payer: Self-pay

## 2020-06-17 ENCOUNTER — Telehealth: Payer: Self-pay | Admitting: General Surgery

## 2020-06-17 ENCOUNTER — Encounter: Payer: Self-pay | Admitting: General Surgery

## 2020-06-17 DIAGNOSIS — Z1231 Encounter for screening mammogram for malignant neoplasm of breast: Secondary | ICD-10-CM

## 2020-06-17 DIAGNOSIS — D242 Benign neoplasm of left breast: Secondary | ICD-10-CM

## 2020-06-17 NOTE — Telephone Encounter (Signed)
Spoke with Asher Muir at [facility] she has received images from [facility] she will call me back as to what to put orders in for. Patient will call me back in 2 weeks to allow [facility] to collect the images from [facility]-patient was asked to call [facility] to sign a release if needed.

## 2020-06-17 NOTE — Telephone Encounter (Signed)
Burna Mortimer called from [facility] breast imaging regarding this patient, said the the patient did not want to travel to [facility] for any imaging and wanted to be setup at [facility] breast center for her 6 month check up. Please call patient and advise.

## 2020-06-21 ENCOUNTER — Other Ambulatory Visit: Payer: Self-pay

## 2020-06-21 ENCOUNTER — Telehealth: Payer: Self-pay

## 2020-06-21 DIAGNOSIS — D242 Benign neoplasm of left breast: Secondary | ICD-10-CM

## 2020-06-21 NOTE — Telephone Encounter (Signed)
Left message for patient to return call regarding appointments below.   Mammogram scheduled 07/13/2020 at 2:00 pm At [facility].  Dr.Cannon 07/15/20 @ 9:45 am.

## 2020-06-22 ENCOUNTER — Telehealth: Payer: Self-pay

## 2020-06-22 NOTE — Telephone Encounter (Signed)
Patient notified of appointments below- Mammogram 07/13/20 @ 2pm- Dr.Cannon-07/15/20-

## 2020-07-05 ENCOUNTER — Encounter: Payer: Self-pay | Admitting: Gastroenterology

## 2020-07-05 ENCOUNTER — Other Ambulatory Visit: Payer: Self-pay

## 2020-07-08 ENCOUNTER — Other Ambulatory Visit: Payer: Self-pay

## 2020-07-08 ENCOUNTER — Other Ambulatory Visit
Admission: RE | Admit: 2020-07-08 | Discharge: 2020-07-08 | Disposition: A | Discharge status: Home / Self Care | Payer: Managed Care, Other (non HMO) | Source: Ambulatory Visit | Attending: Gastroenterology | Admitting: Gastroenterology

## 2020-07-08 DIAGNOSIS — Z20822 Contact with and (suspected) exposure to covid-19: Secondary | ICD-10-CM | POA: Diagnosis not present

## 2020-07-08 DIAGNOSIS — Z01812 Encounter for preprocedural laboratory examination: Secondary | ICD-10-CM | POA: Diagnosis present

## 2020-07-08 LAB — SARS CORONAVIRUS 2 (TAT 6-24 HRS): SARS Coronavirus 2: NEGATIVE

## 2020-07-08 NOTE — Discharge Instructions (Signed)

## 2020-07-12 ENCOUNTER — Encounter
Admission: RE | Disposition: A | Discharge status: Home / Self Care | Payer: Self-pay | Source: Home / Self Care | Attending: Gastroenterology

## 2020-07-12 ENCOUNTER — Ambulatory Visit
Admission: RE | Admit: 2020-07-12 | Discharge: 2020-07-12 | Disposition: A | Discharge status: Home / Self Care | Payer: Managed Care, Other (non HMO) | Attending: Gastroenterology | Admitting: Gastroenterology

## 2020-07-12 ENCOUNTER — Ambulatory Visit: Payer: Managed Care, Other (non HMO) | Admitting: Anesthesiology

## 2020-07-12 ENCOUNTER — Other Ambulatory Visit: Payer: Self-pay

## 2020-07-12 ENCOUNTER — Encounter: Payer: Self-pay | Admitting: Gastroenterology

## 2020-07-12 DIAGNOSIS — K635 Polyp of colon: Secondary | ICD-10-CM

## 2020-07-12 DIAGNOSIS — D125 Benign neoplasm of sigmoid colon: Secondary | ICD-10-CM | POA: Insufficient documentation

## 2020-07-12 DIAGNOSIS — K640 First degree hemorrhoids: Secondary | ICD-10-CM | POA: Diagnosis not present

## 2020-07-12 DIAGNOSIS — Z1211 Encounter for screening for malignant neoplasm of colon: Secondary | ICD-10-CM | POA: Insufficient documentation

## 2020-07-12 HISTORY — PX: POLYPECTOMY: SHX5525

## 2020-07-12 HISTORY — PX: COLONOSCOPY WITH PROPOFOL: SHX5780

## 2020-07-12 HISTORY — DX: Other specified postprocedural states: Z98.890

## 2020-07-12 HISTORY — DX: Nausea with vomiting, unspecified: R11.2

## 2020-07-12 LAB — POCT PREGNANCY, URINE: Preg Test, Ur: NEGATIVE

## 2020-07-12 SURGERY — COLONOSCOPY WITH PROPOFOL
Anesthesia: General | Site: Rectum

## 2020-07-12 MED ORDER — ACETAMINOPHEN 325 MG PO TABS: 325.0000 mg | ORAL_TABLET | Freq: Once | ORAL | Status: DC

## 2020-07-12 MED ORDER — STERILE WATER FOR IRRIGATION IR SOLN
Status: DC | PRN
  Administered 2020-07-12: .05 mL

## 2020-07-12 MED ORDER — ACETAMINOPHEN 160 MG/5ML PO SOLN: 325.0000 mg | Freq: Once | ORAL | Status: DC

## 2020-07-12 MED ORDER — PROPOFOL 10 MG/ML IV BOLUS
INTRAVENOUS | Status: DC | PRN
  Administered 2020-07-12: 20 mg via INTRAVENOUS
  Administered 2020-07-12: 30 mg via INTRAVENOUS
  Administered 2020-07-12: 20 mg via INTRAVENOUS
  Administered 2020-07-12: 50 mg via INTRAVENOUS

## 2020-07-12 MED ORDER — LACTATED RINGERS IV SOLN: INTRAVENOUS | Status: DC

## 2020-07-12 MED ORDER — LIDOCAINE HCL (CARDIAC) PF 100 MG/5ML IV SOSY
PREFILLED_SYRINGE | INTRAVENOUS | Status: DC | PRN
  Administered 2020-07-12: 60 mg via INTRAVENOUS

## 2020-07-12 SURGICAL SUPPLY — 7 items
FORCEPS BIOP RAD 4 LRG CAP 4 (CUTTING FORCEPS) ×3 IMPLANT
GOWN CVR UNV OPN BCK APRN NK (MISCELLANEOUS) ×4 IMPLANT
GOWN ISOL THUMB LOOP REG UNIV (MISCELLANEOUS) ×6
KIT PRC NS LF DISP ENDO (KITS) ×2 IMPLANT
KIT PROCEDURE OLYMPUS (KITS) ×3
MANIFOLD NEPTUNE II (INSTRUMENTS) ×3 IMPLANT
WATER STERILE IRR 250ML POUR (IV SOLUTION) ×3 IMPLANT

## 2020-07-12 NOTE — Anesthesia Preprocedure Evaluation (Signed)
Anesthesia Evaluation  Patient identified by MRN, date of birth, ID band Patient awake    Reviewed: Allergy & Precautions, H&P , NPO status , Patient's Chart, lab work & pertinent test results  History of Anesthesia Complications (+) PONV and history of anesthetic complications  Airway Mallampati: II  TM Distance: >3 FB Neck ROM: full    Dental no notable dental hx.    Pulmonary    Pulmonary exam normal breath sounds clear to auscultation       Cardiovascular Normal cardiovascular exam Rhythm:regular Rate:Normal     Neuro/Psych    GI/Hepatic   Endo/Other  Hyperthyroidism   Renal/GU      Musculoskeletal   Abdominal   Peds  Hematology   Anesthesia Other Findings   Reproductive/Obstetrics                             Anesthesia Physical Anesthesia Plan  ASA: II  Anesthesia Plan: General   Post-op Pain Management:    Induction: Intravenous  PONV Risk Score and Plan: 4 or greater and Treatment may vary due to age or medical condition, TIVA and Propofol infusion  Airway Management Planned: Natural Airway  Additional Equipment:   Intra-op Plan:   Post-operative Plan:   Informed Consent: I have reviewed the patients History and Physical, chart, labs and discussed the procedure including the risks, benefits and alternatives for the proposed anesthesia with the patient or authorized representative who has indicated his/her understanding and acceptance.     Dental Advisory Given  Plan Discussed with: CRNA  Anesthesia Plan Comments:         Anesthesia Quick Evaluation

## 2020-07-12 NOTE — Op Note (Signed)
[facility] Gastroenterology Patient Name: Beth [name] Procedure Date: 07/12/2020 8:26 AM MRN: [id number] Account #: [id number] Date of Birth: [birth year] Admit Type: Outpatient Age: 50 Room: [facility] OR ROOM 01 Gender: Female Note Status: Finalized Procedure:             Colonoscopy Indications:           Screening for colorectal malignant neoplasm Providers:             Lucilla Lame MD, MD Referring MD:          Dionne Bucy. Bacigalupo (Referring MD) Medicines:             Propofol per Anesthesia Complications:         No immediate complications. Procedure:             Pre-Anesthesia Assessment:                        - Prior to the procedure, a History and Physical was                         performed, and patient medications and allergies were                         reviewed. The patient's tolerance of previous                         anesthesia was also reviewed. The risks and benefits                         of the procedure and the sedation options and risks                         were discussed with the patient. All questions were                         answered, and informed consent was obtained. Prior                         Anticoagulants: The patient has taken no previous                         anticoagulant or antiplatelet agents. ASA Grade                         Assessment: II - A patient with mild systemic disease.                         After reviewing the risks and benefits, the patient                         was deemed in satisfactory condition to undergo the                         procedure.                        After obtaining informed consent, the colonoscope was  passed under direct vision. Throughout the procedure,                         the patient's blood pressure, pulse, and oxygen                         saturations were monitored continuously. The                         Colonoscope was introduced through the  anus and                         advanced to the the cecum, identified by appendiceal                         orifice and ileocecal valve. The colonoscopy was                         performed without difficulty. The patient tolerated                         the procedure well. The quality of the bowel                         preparation was excellent. Findings:      The perianal and digital rectal examinations were normal.      A 3 mm polyp was found in the sigmoid colon. The polyp was sessile. The       polyp was removed with a cold biopsy forceps. Resection and retrieval       were complete.      Non-bleeding internal hemorrhoids were found during retroflexion. The       hemorrhoids were Grade I (internal hemorrhoids that do not prolapse). Impression:            - One 3 mm polyp in the sigmoid colon, removed with a                         cold biopsy forceps. Resected and retrieved.                        - Non-bleeding internal hemorrhoids. Recommendation:        - Discharge patient to home.                        - Resume previous diet.                        - Continue present medications.                        - Await pathology results.                        - Repeat colonoscopy in 7 years if polyp adenoma and                         10 years if hyperplastic Procedure Code(s):     --- Professional ---  45380, Colonoscopy, flexible; with biopsy, single or                         multiple Diagnosis Code(s):     --- Professional ---                        Z12.11, Encounter for screening for malignant neoplasm                         of colon                        K63.5, Polyp of colon CPT copyright 2019 American Medical Association. All rights reserved. The codes documented in this report are preliminary and upon coder review may  be revised to meet current compliance requirements. Lucilla Lame MD, MD 07/12/2020 8:44:02 AM This report has been signed  electronically. Number of Addenda: 0 Note Initiated On: 07/12/2020 8:26 AM Scope Withdrawal Time: 0 hours 6 minutes 42 seconds  Total Procedure Duration: 0 hours 10 minutes 52 seconds  Estimated Blood Loss:  Estimated blood loss: none.      [facility]

## 2020-07-12 NOTE — Transfer of Care (Signed)
Immediate Anesthesia Transfer of Care Note  Patient: Beth [name]  Procedure(s) Performed: COLONOSCOPY WITH PROPOFOL (N/A Rectum) POLYPECTOMY (Rectum)  Patient Location: PACU  Anesthesia Type: General  Level of Consciousness: awake, alert  and patient cooperative  Airway and Oxygen Therapy: Patient Spontanous Breathing and Patient connected to supplemental oxygen  Post-op Assessment: Post-op Vital signs reviewed, Patient's Cardiovascular Status Stable, Respiratory Function Stable, Patent Airway and No signs of Nausea or vomiting  Post-op Vital Signs: Reviewed and stable  Complications: No complications documented.

## 2020-07-12 NOTE — Anesthesia Postprocedure Evaluation (Signed)
Anesthesia Post Note  Patient: Beth [name]  Procedure(s) Performed: COLONOSCOPY WITH PROPOFOL (N/A Rectum) POLYPECTOMY (Rectum)     Patient location during evaluation: PACU Anesthesia Type: General Level of consciousness: awake and alert and oriented Pain management: satisfactory to patient Vital Signs Assessment: post-procedure vital signs reviewed and stable Respiratory status: spontaneous breathing, nonlabored ventilation and respiratory function stable Cardiovascular status: blood pressure returned to baseline and stable Postop Assessment: Adequate PO intake and No signs of nausea or vomiting Anesthetic complications: no   No complications documented.  Raliegh Ip

## 2020-07-12 NOTE — Anesthesia Procedure Notes (Signed)
Date/Time: 07/12/2020 8:25 AM Performed by: Dionne Bucy, CRNA Pre-anesthesia Checklist: Patient identified, Emergency Drugs available, Suction available, Patient being monitored and Timeout performed Oxygen Delivery Method: Nasal cannula Placement Confirmation: positive ETCO2

## 2020-07-12 NOTE — H&P (Signed)
Beth Lame, MD [facility] [address], [place], [place] [address] Phone: [phone] Fax : [phone]  Primary Care Physician:  Virginia Crews, MD Primary Gastroenterologist:  Dr. Allen Norris  Pre-Procedure History & Physical: HPI:  Beth [name] is a 50 y.o. female is here for a screening colonoscopy.   Past Medical History:  Diagnosis Date  . Cardiac disease   . Diffuse cystic mastopathy   . Family history of malignant neoplasm of breast   . Graves disease    In remission  . Hyperthyroidism   . PONV (postoperative nausea and vomiting)     Past Surgical History:  Procedure Laterality Date  . BREAST BIOPSY  2008  . BREAST BIOPSY Left 2/13    aspiration cyst  . BREAST BIOPSY Left   . DILATION AND CURETTAGE OF UTERUS  2011  . eyelid surgery     . ORBITAL RECONSTRUCTION Left 2008  . WISDOM TOOTH EXTRACTION      Prior to Admission medications   Medication Sig Start Date End Date Taking? Authorizing Provider  acetaminophen (TYLENOL) 650 MG CR tablet  12/19/19  Yes [provider]  calcium-vitamin D (OSCAL WITH D) 500-200 MG-UNIT tablet Take 1 tablet by mouth.   Yes [provider]  CRANBERRY PO Take 2 tablets by mouth 2 (two) times daily.   Yes [provider]  FIBER PO Take 1 each by mouth daily. Gummy   Yes [provider]  fish oil-omega-3 fatty acids 1000 MG capsule Take 2 g by mouth daily.   Yes [provider]  Garlic Oil 0737 MG CAPS Take by mouth.   Yes [provider]  Ibuprofen 200 MG CAPS Take by mouth as needed.   Yes [provider]  Ibuprofen-Diphenhydramine Cit (ADVIL PM PO) Take 1 tablet by mouth as needed.   Yes [provider]  Multiple Vitamins-Calcium (ONE-A-DAY WOMENS FORMULA) TABS Take by mouth. 06/19/16  Yes [provider]  vitamin B-12 (CYANOCOBALAMIN) 100 MCG tablet Take 100 mcg by mouth daily.   Yes [provider]    Allergies as of 05/24/2020  . (No  Known Allergies)    Family History  Problem Relation Age of Onset  . Diabetes Mother   . Hypertension Mother   . Hyperlipidemia Mother   . Stroke Father   . Hypertension Father   . Hyperlipidemia Father   . Hypothyroidism Father   . Prostate cancer Father   . Hyperlipidemia Sister   . Hypertension Sister   . Hyperlipidemia Sister   . Hypertension Sister   . Diabetes Sister   . Hyperlipidemia Sister   . Hypertension Sister   . Breast cancer Maternal Grandmother     Social History   Socioeconomic History  . Marital status: Married    Spouse name: Not on file  . Number of children: Not on file  . Years of education: Not on file  . Highest education level: Not on file  Occupational History  . Not on file  Tobacco Use  . Smoking status: Never Smoker  . Smokeless tobacco: Never Used  Vaping Use  . Vaping Use: Never used  Substance and Sexual Activity  . Alcohol use: Yes    Comment: occasional (2x/yr)  . Drug use: No  . Sexual activity: Yes    Birth control/protection: None  Other Topics Concern  . Not on file  Social History Narrative  . Not on file   Social Determinants of Health   Financial  Resource Strain: Not on file  Food Insecurity: Not on file  Transportation Needs: Not on file  Physical Activity: Not on file  Stress: Not on file  Social Connections: Not on file  Intimate Partner Violence: Not on file    Review of Systems: See HPI, otherwise negative ROS  Physical Exam: BP (!) 161/95   Pulse (!) 117   Ht 5\' 3"  (1.6 m)   Wt 81.2 kg   LMP 07/01/2020 (Exact Date) Comment: preg test neg  SpO2 94%   BMI 31.71 kg/m  General:   Alert,  pleasant and cooperative in NAD Head:  Normocephalic and atraumatic. Neck:  Supple; no masses or thyromegaly. Lungs:  Clear throughout to auscultation.    Heart:  Regular rate and rhythm. Abdomen:  Soft, nontender and nondistended. Normal bowel sounds, without guarding, and without rebound.   Neurologic:  Alert  and  oriented x4;  grossly normal neurologically.  Impression/Plan: Beth [name] is now here to undergo a screening colonoscopy.  Risks, benefits, and alternatives regarding colonoscopy have been reviewed with the patient.  Questions have been answered.  All parties agreeable.

## 2020-07-13 ENCOUNTER — Encounter: Payer: Self-pay | Admitting: Gastroenterology

## 2020-07-13 ENCOUNTER — Ambulatory Visit
Admission: RE | Admit: 2020-07-13 | Discharge: 2020-07-13 | Disposition: A | Discharge status: Home / Self Care | Payer: Managed Care, Other (non HMO) | Source: Ambulatory Visit | Attending: General Surgery | Admitting: General Surgery

## 2020-07-13 DIAGNOSIS — D242 Benign neoplasm of left breast: Secondary | ICD-10-CM | POA: Insufficient documentation

## 2020-07-13 LAB — SURGICAL PATHOLOGY

## 2020-07-14 ENCOUNTER — Other Ambulatory Visit: Payer: Self-pay | Admitting: General Surgery

## 2020-07-14 DIAGNOSIS — N6320 Unspecified lump in the left breast, unspecified quadrant: Secondary | ICD-10-CM

## 2020-07-14 DIAGNOSIS — R928 Other abnormal and inconclusive findings on diagnostic imaging of breast: Secondary | ICD-10-CM

## 2020-07-15 ENCOUNTER — Ambulatory Visit
Admission: RE | Admit: 2020-07-15 | Discharge: 2020-07-15 | Disposition: A | Discharge status: Home / Self Care | Payer: Managed Care, Other (non HMO) | Source: Ambulatory Visit | Attending: General Surgery | Admitting: General Surgery

## 2020-07-15 ENCOUNTER — Other Ambulatory Visit: Payer: Self-pay

## 2020-07-15 ENCOUNTER — Encounter: Payer: Self-pay | Admitting: Gastroenterology

## 2020-07-15 ENCOUNTER — Ambulatory Visit (INDEPENDENT_AMBULATORY_CARE_PROVIDER_SITE_OTHER): Payer: Managed Care, Other (non HMO) | Admitting: General Surgery

## 2020-07-15 ENCOUNTER — Encounter: Payer: Self-pay | Admitting: General Surgery

## 2020-07-15 VITALS — BP 137/81 | HR 77 | Temp 98.6°F | Ht 64.0 in | Wt 181.4 lb

## 2020-07-15 DIAGNOSIS — D242 Benign neoplasm of left breast: Secondary | ICD-10-CM

## 2020-07-15 DIAGNOSIS — N6320 Unspecified lump in the left breast, unspecified quadrant: Secondary | ICD-10-CM | POA: Insufficient documentation

## 2020-07-15 DIAGNOSIS — R928 Other abnormal and inconclusive findings on diagnostic imaging of breast: Secondary | ICD-10-CM

## 2020-07-15 HISTORY — PX: BREAST BIOPSY: SHX20

## 2020-07-15 NOTE — Patient Instructions (Addendum)
Dr Celine Ahr discussed with patient to keep scheduled appointment with Radiology today. Patient will follow up with our office after results are received from Breast Biopsy via telephone call.   Breast Self-Awareness Breast self-awareness is knowing how your breasts look and feel. Doing breast self-awareness is important. It allows you to catch a breast problem early while it is still small and can be treated. All women should do breast self-awareness, including women who have had breast implants. Tell your doctor if you notice a change in your breasts. What you need:  A mirror.  A well-lit room. How to do a breast self-exam A breast self-exam is one way to learn what is normal for your breasts and to check for changes. To do a breast self-exam: Look for changes 1. Take off all the clothes above your waist. 2. Stand in front of a mirror in a room with good lighting. 3. Put your hands on your hips. 4. Push your hands down. 5. Look at your breasts and nipples in the mirror to see if one breast or nipple looks different from the other. Check to see if: ? The shape of one breast is different. ? The size of one breast is different. ? There are wrinkles, dips, and bumps in one breast and not the other. 6. Look at each breast for changes in the skin, such as: ? Redness. ? Scaly areas. 7. Look for changes in your nipples, such as: ? Liquid around the nipples. ? Bleeding. ? Dimpling. ? Redness. ? A change in where the nipples are.   Feel for changes 1. Lie on your back on the floor. 2. Feel each breast. To do this, follow these steps: ? Pick a breast to feel. ? Put the arm closest to that breast above your head. ? Use your other arm to feel the nipple area of your breast. Feel the area with the pads of your three middle fingers by making small circles with your fingers. For the first circle, press lightly. For the second circle, press harder. For the third circle, press even harder. ? Keep  making circles with your fingers at the different pressures as you move down your breast. Stop when you feel your ribs. ? Move your fingers a little toward the center of your body. ? Start making circles with your fingers again, this time going up until you reach your collarbone. ? Keep making up-and-down circles until you reach your armpit. Remember to keep using the three pressures. ? Feel the other breast in the same way. 3. Sit or stand in the tub or shower. 4. With soapy water on your skin, feel each breast the same way you did in step 2 when you were lying on the floor.   Write down what you find Writing down what you find can help you remember what to tell your doctor. Write down:  What is normal for each breast.  Any changes you find in each breast, including: ? The kind of changes you find. ? Whether you have pain. ? Size and location of any lumps.  When you last had your menstrual period. General tips  Check your breasts every month.  If you are breastfeeding, the best time to check your breasts is after you feed your baby or after you use a breast pump.  If you get menstrual periods, the best time to check your breasts is 5-7 days after your menstrual period is over.  With time, you will become comfortable with  the self-exam, and you will begin to know if there are changes in your breasts. Contact a doctor if you:  See a change in the shape or size of your breasts or nipples.  See a change in the skin of your breast or nipples, such as red or scaly skin.  Have fluid coming from your nipples that is not normal.  Find a lump or thick area that was not there before.  Have pain in your breasts.  Have any concerns about your breast health. Summary  Breast self-awareness includes looking for changes in your breasts, as well as feeling for changes within your breasts.  Breast self-awareness should be done in front of a mirror in a well-lit room.  You should check  your breasts every month. If you get menstrual periods, the best time to check your breasts is 5-7 days after your menstrual period is over.  Let your doctor know of any changes you see in your breasts, including changes in size, changes on the skin, pain or tenderness, or fluid from your nipples that is not normal. This information is not intended to replace advice given to you by your health care provider. Make sure you discuss any questions you have with your health care provider. Document Revised: 01/22/2018 Document Reviewed: 01/22/2018 Elsevier Patient Education  [place].

## 2020-07-15 NOTE — H&P (View-Only) (Signed)
Patient ID: Beth [name], female   DOB: [birth year], 50 y.o.   MRN: [id number]  Chief Complaint  Patient presents with  . Follow-up    Follow up: Left Breast papilloma/inverted nipple-Mammo 07/13/20    HPI Beth [name] is a 50 y.o. female.   I originally saw her in August 2021. My history of present illness is copied here:  "She has been referred by her OB/GYN, Dr. Kenton Kingfisher, for further evaluation of left nipple inversion. [name] states that her husband actually noticed some nipple changes on the left and felt a small nodule or mass in the periareolar area. She was not quite due for her mammogram at the time so this was expedited.  MAMMOGRAM:  There are faint amorphous calcifications in the immediate retroareolar anterior depth left breast. Unchanged subdermal oval mass in the left lower inner quadrant. Bilateral nipple inversion, with possible left nipple retraction (increasing over time). There are otherwise no suspicious masses, malignant type calcifications, architectural distortion or concerning asymmetries in either breast.   TARGETED ULTRASOUND:  In the retroareolar anterior depth, there is a 1.5 x 0.7 x 1.3 cm oval, parallel, heterogeneous mass. There is an additional similar appearing heterogeneous oval and parallel mass at the 6:00 position, approximately 3 cm from the nipple which measures 1.6 x 0.6 x 2.3 cm; this larger mass is demonstrated to be mammographically stable over time consistent with benign etiology. Incidental note of adjacent simple benign cyst.   Because of the findings on mammogram, a biopsy was performed.  Final Diagnosis            A: Breast, left, core biopsy -Sclerosing intraductal papilloma with apocrine metaplasia -No atypia, in situ or invasive carcinoma identified  She is here today to discuss these results and further planning. She states that she did not notice a mass; her husband found it. She denies any nipple discharge. She did notice some slight  retraction of the left nipple, but no other skin changes. She does not recall her age at menarche. She is still having menstrual cycles. She had one pregnancy at age 15 that ended in miscarriage. She did use oral contraceptive pills, but is not certain of the duration. Her maternal grandmother developed breast cancer in her 83s. Her father had prostate cancer."  At that visit, we decided to pursue short-term interval surveillance imaging. She had the imaging done on 07/13/2020 at the [facility] breast center; her prior imaging had been done at [facility]. I have copied the imaging report here:  "CLINICAL DATA:  The patient presented with nipple inversion January 09, 2020. An intraductal mass was identified and biopsied demonstrating a papilloma. A six-month follow-up was recommended. The patient has a known mass at approximately 6 o'clock in the left breast which is been stable for many years and presumed benign.  EXAM: DIGITAL DIAGNOSTIC BILATERAL MAMMOGRAM WITH CAD AND TOMOSYNTHESIS  ULTRASOUND LEFT BREAST  TECHNIQUE: Left digital diagnostic mammography and breast tomosynthesis was performed. Digital images of the breasts were evaluated with computer-aided detection. Targeted ultrasound examination of the Left breast was performed.  COMPARISON:  Previous exam(s).  ACR Breast Density Category c: The breast tissue is heterogeneously dense, which may obscure small masses.  FINDINGS: There is an apparent obscured mass in the lateral left breast. There is distortion between 11 o'clock and 1 o'clock in the superior left breast at a mid depth extending towards the nipple. The biopsy clip is located within a retroareolar mass which is stable. The known stable  6 o'clock mass is unchanged on the left  Targeted ultrasound is performed, showing a minimally complicated cyst in the lateral left breast at 2:30, 4 cm from the nipple accounting for the obscured mass. The known previously  biopsied intraductal papilloma measures 7 x 9 x 5 mm, unchanged. There is an irregular mass with shadowing in the left breast at 11:30, 4 cm from the nipple measures 8 x 11 by 13 mm, likely correlating with the known distortion and resulting nipple inversion. No axillary adenopathy.  IMPRESSION: There is a suspicious mass resulting in left breast distortion at 11:30, 4 cm from the nipple. I suspect the sonographically measured size of 8 x 11 x 13 mm may not accurately represent the true size of the abnormality. The intraductal papilloma measures a little smaller in the interval, likely due to interval biopsy. No other suspicious findings.  RECOMMENDATION: Recommend ultrasound-guided biopsy of the irregular mass in the left breast at 11:30. Recommend clip placement with follow-up mammography to ensure this mass correlates with the left breast distortion. If it does not, recommend stereotactic biopsy of the distortion. If malignancy is identified as suspected, recommend MRI prior to surgery if breast conservation is being considered. If the patient proceeds to surgery for the irregular mass, recommend surgical excision of the intraductal papilloma.  I have discussed the findings and recommendations with the patient. If applicable, a reminder letter will be sent to the patient regarding the next appointment.  BI-RADS CATEGORY  5: Highly suggestive of malignancy."  She is actually scheduled to have the biopsy later today. Once again, she is accompanied by her husband who asks the majority of the questions. She herself states that she has not noticed any changes in her breasts. No difference in the degree of nipple inversion. No nipple discharge. No skin changes in the surrounding breast tissue.  She recently underwent her first colonoscopy and a benign tubular adenoma was removed.    Past Medical History:  Diagnosis Date  . Cardiac disease   . Diffuse cystic mastopathy   .  Family history of malignant neoplasm of breast   . Graves disease    In remission  . Hyperthyroidism   . PONV (postoperative nausea and vomiting)     Past Surgical History:  Procedure Laterality Date  . BREAST BIOPSY Right 2008   benign  . BREAST BIOPSY Left 2/13    aspiration cyst  . BREAST BIOPSY Left 01/2020   Sclerosing intraductal papilloma with apocrine metaplasia. No atypia or malignancy.  . COLONOSCOPY WITH PROPOFOL N/A 07/12/2020   Procedure: COLONOSCOPY WITH PROPOFOL;  Surgeon: Lucilla Lame, MD;  Location: [facility];  Service: Endoscopy;  Laterality: N/A;  PRIORITY 4  . DILATION AND CURETTAGE OF UTERUS  2011  . eyelid surgery     . ORBITAL RECONSTRUCTION Left 2008  . POLYPECTOMY  07/12/2020   Procedure: POLYPECTOMY;  Surgeon: Lucilla Lame, MD;  Location: [place];  Service: Endoscopy;;  . WISDOM TOOTH EXTRACTION      Family History  Problem Relation Age of Onset  . Diabetes Mother   . Hypertension Mother   . Hyperlipidemia Mother   . Hypertension Father   . Hyperlipidemia Father   . Hypothyroidism Father   . Prostate cancer Father   . Hyperlipidemia Sister   . Hypertension Sister   . Hyperlipidemia Sister   . Hypertension Sister   . Diabetes Sister   . Hyperlipidemia Sister   . Hypertension Sister   . Breast cancer  Maternal Grandmother     Social History Social History   Tobacco Use  . Smoking status: Never Smoker  . Smokeless tobacco: Never Used  Vaping Use  . Vaping Use: Never used  Substance Use Topics  . Alcohol use: Yes    Comment: occasional (2x/yr)  . Drug use: No    No Known Allergies  Current Outpatient Medications  Medication Sig Dispense Refill  . acetaminophen (TYLENOL) 650 MG CR tablet     . calcium-vitamin D (OSCAL WITH D) 500-200 MG-UNIT tablet Take 1 tablet by mouth.    . CRANBERRY PO Take 2 tablets by mouth 2 (two) times daily.    Marland Kitchen FIBER PO Take 1 each by mouth daily. Gummy    . fish oil-omega-3 fatty  acids 1000 MG capsule Take 2 g by mouth daily.    . Garlic Oil 9509 MG CAPS Take by mouth.    . Ibuprofen 200 MG CAPS Take by mouth as needed.    . Ibuprofen-Diphenhydramine Cit (ADVIL PM PO) Take 1 tablet by mouth as needed.    . Multiple Vitamins-Calcium (ONE-A-DAY WOMENS FORMULA) TABS Take by mouth.    . vitamin B-12 (CYANOCOBALAMIN) 100 MCG tablet Take 100 mcg by mouth daily.     No current facility-administered medications for this visit.    Review of Systems Review of Systems  All other systems reviewed and are negative.   Blood pressure 137/81, pulse 77, temperature 98.6 F (37 C), temperature source Oral, height 5\' 4"  (1.626 m), weight 181 lb 6.4 oz (82.3 kg), last menstrual period 07/01/2020, SpO2 99 %.  Physical Exam Physical Exam Constitutional:      General: She is not in acute distress.    Appearance: She is obese.  HENT:     Head: Normocephalic and atraumatic.  Eyes:     General: No scleral icterus.    Comments: Changes consistent with mild Graves' ophthalmopathy and subsequent decompression surgery.  Cardiovascular:     Rate and Rhythm: Normal rate and regular rhythm.  Pulmonary:     Effort: Pulmonary effort is normal. No respiratory distress.  Chest:  Breasts:     Right: Normal. No axillary adenopathy or supraclavicular adenopathy.     Left: Inverted nipple present. No nipple discharge, skin change, tenderness, axillary adenopathy or supraclavicular adenopathy.        Comments: There is a thickened area in the left breast in roughly the same anatomic location as described on her imaging studies. It is difficult to discriminate from normal dense breast tissue and is only really appreciated when it is captured between the examining fingertips and the chest wall. There is a small skin tag adjacent to the right nipple. Genitourinary:    Comments: Deferred Musculoskeletal:        General: No swelling or tenderness.  Lymphadenopathy:     Upper Body:     Right  upper body: No supraclavicular, axillary or pectoral adenopathy.     Left upper body: No supraclavicular, axillary or pectoral adenopathy.  Skin:    General: Skin is warm.  Neurological:     General: No focal deficit present.     Mental Status: She is alert and oriented to person, place, and time.  Psychiatric:        Mood and Affect: Mood normal.        Behavior: Behavior normal.     Data Reviewed See my history of present illness for the imaging studies and pathology that has been reviewed.  Assessment This is a 50 year old woman who I originally saw for a single intraductal papilloma in August 2021. Short-term surveillance was selected at that time. She underwent repeat imaging that identified a concerning area of the left breast 4 cm from the nipple at about the 11:30 position. I do think I can palpate this area on exam. She is scheduled to undergo biopsy later today. Of note, the imaging performed on the 25th was done at [facility] breast center and her prior imaging was done at a different institution so there may be discrepancies between the radiology interpretations.  Plan I will await the results of her biopsy and contact her with these results. We will make plans regarding further intervention and/or management at that time.  Greater than 50% of this 30-minute visit was spent in counseling and coordination of patient care.    [name] 07/15/2020, 10:42 AM

## 2020-07-15 NOTE — Progress Notes (Signed)
Patient ID: Beth [name], female   DOB: 06/01/1971, 49 y.o.   MRN: [id number]  Chief Complaint  Patient presents with  . Follow-up    Follow up: Left Breast papilloma/inverted nipple-Mammo 07/13/20    HPI Beth [name] is a 49 y.o. female.   I originally saw her in August 2021. My history of present illness is copied here:  "She has been referred by her OB/GYN, Dr. Harris, for further evaluation of left nipple inversion. Ms. Merkle states that her husband actually noticed some nipple changes on the left and felt a small nodule or mass in the periareolar area. She was not quite due for her mammogram at the time so this was expedited.  MAMMOGRAM:  There are faint amorphous calcifications in the immediate retroareolar anterior depth left breast. Unchanged subdermal oval mass in the left lower inner quadrant. Bilateral nipple inversion, with possible left nipple retraction (increasing over time). There are otherwise no suspicious masses, malignant type calcifications, architectural distortion or concerning asymmetries in either breast.   TARGETED ULTRASOUND:  In the retroareolar anterior depth, there is a 1.5 x 0.7 x 1.3 cm oval, parallel, heterogeneous mass. There is an additional similar appearing heterogeneous oval and parallel mass at the 6:00 position, approximately 3 cm from the nipple which measures 1.6 x 0.6 x 2.3 cm; this larger mass is demonstrated to be mammographically stable over time consistent with benign etiology. Incidental note of adjacent simple benign cyst.   Because of the findings on mammogram, a biopsy was performed.  Final Diagnosis            A: Breast, left, core biopsy -Sclerosing intraductal papilloma with apocrine metaplasia -No atypia, in situ or invasive carcinoma identified  She is here today to discuss these results and further planning. She states that she did not notice a mass; her husband found it. She denies any nipple discharge. She did notice some slight  retraction of the left nipple, but no other skin changes. She does not recall her age at menarche. She is still having menstrual cycles. She had one pregnancy at age 39 that ended in miscarriage. She did use oral contraceptive pills, but is not certain of the duration. Her maternal grandmother developed breast cancer in her 80s. Her father had prostate cancer."  At that visit, we decided to pursue short-term interval surveillance imaging. She had the imaging done on 07/13/2020 at the [place] breast center; her prior imaging had been done at [facility]. I have copied the imaging report here:  "CLINICAL DATA:  The patient presented with nipple inversion January 09, 2020. An intraductal mass was identified and biopsied demonstrating a papilloma. A six-month follow-up was recommended. The patient has a known mass at approximately 6 o'clock in the left breast which is been stable for many years and presumed benign.  EXAM: DIGITAL DIAGNOSTIC BILATERAL MAMMOGRAM WITH CAD AND TOMOSYNTHESIS  ULTRASOUND LEFT BREAST  TECHNIQUE: Left digital diagnostic mammography and breast tomosynthesis was performed. Digital images of the breasts were evaluated with computer-aided detection. Targeted ultrasound examination of the Left breast was performed.  COMPARISON:  Previous exam(s).  ACR Breast Density Category c: The breast tissue is heterogeneously dense, which may obscure small masses.  FINDINGS: There is an apparent obscured mass in the lateral left breast. There is distortion between 11 o'clock and 1 o'clock in the superior left breast at a mid depth extending towards the nipple. The biopsy clip is located within a retroareolar mass which is stable. The known stable   6 o'clock mass is unchanged on the left  Targeted ultrasound is performed, showing a minimally complicated cyst in the lateral left breast at 2:30, 4 cm from the nipple accounting for the obscured mass. The known previously  biopsied intraductal papilloma measures 7 x 9 x 5 mm, unchanged. There is an irregular mass with shadowing in the left breast at 11:30, 4 cm from the nipple measures 8 x 11 by 13 mm, likely correlating with the known distortion and resulting nipple inversion. No axillary adenopathy.  IMPRESSION: There is a suspicious mass resulting in left breast distortion at 11:30, 4 cm from the nipple. I suspect the sonographically measured size of 8 x 11 x 13 mm may not accurately represent the true size of the abnormality. The intraductal papilloma measures a little smaller in the interval, likely due to interval biopsy. No other suspicious findings.  RECOMMENDATION: Recommend ultrasound-guided biopsy of the irregular mass in the left breast at 11:30. Recommend clip placement with follow-up mammography to ensure this mass correlates with the left breast distortion. If it does not, recommend stereotactic biopsy of the distortion. If malignancy is identified as suspected, recommend MRI prior to surgery if breast conservation is being considered. If the patient proceeds to surgery for the irregular mass, recommend surgical excision of the intraductal papilloma.  I have discussed the findings and recommendations with the patient. If applicable, a reminder letter will be sent to the patient regarding the next appointment.  BI-RADS CATEGORY  5: Highly suggestive of malignancy."  She is actually scheduled to have the biopsy later today. Once again, she is accompanied by her husband who asks the majority of the questions. She herself states that she has not noticed any changes in her breasts. No difference in the degree of nipple inversion. No nipple discharge. No skin changes in the surrounding breast tissue.  She recently underwent her first colonoscopy and a benign tubular adenoma was removed.    Past Medical History:  Diagnosis Date  . Cardiac disease   . Diffuse cystic mastopathy   .  Family history of malignant neoplasm of breast   . Graves disease    In remission  . Hyperthyroidism   . PONV (postoperative nausea and vomiting)     Past Surgical History:  Procedure Laterality Date  . BREAST BIOPSY Right 2008   benign  . BREAST BIOPSY Left 2/13    aspiration cyst  . BREAST BIOPSY Left 01/2020   Sclerosing intraductal papilloma with apocrine metaplasia. No atypia or malignancy.  . COLONOSCOPY WITH PROPOFOL N/A 07/12/2020   Procedure: COLONOSCOPY WITH PROPOFOL;  Surgeon: Wohl, Darren, MD;  Location: [facility];  Service: Endoscopy;  Laterality: N/A;  PRIORITY 4  . DILATION AND CURETTAGE OF UTERUS  2011  . eyelid surgery     . ORBITAL RECONSTRUCTION Left 2008  . POLYPECTOMY  07/12/2020   Procedure: POLYPECTOMY;  Surgeon: Wohl, Darren, MD;  Location: [facility];  Service: Endoscopy;;  . WISDOM TOOTH EXTRACTION      Family History  Problem Relation Age of Onset  . Diabetes Mother   . Hypertension Mother   . Hyperlipidemia Mother   . Hypertension Father   . Hyperlipidemia Father   . Hypothyroidism Father   . Prostate cancer Father   . Hyperlipidemia Sister   . Hypertension Sister   . Hyperlipidemia Sister   . Hypertension Sister   . Diabetes Sister   . Hyperlipidemia Sister   . Hypertension Sister   . Breast cancer   Maternal Grandmother     Social History Social History   Tobacco Use  . Smoking status: Never Smoker  . Smokeless tobacco: Never Used  Vaping Use  . Vaping Use: Never used  Substance Use Topics  . Alcohol use: Yes    Comment: occasional (2x/yr)  . Drug use: No    No Known Allergies  Current Outpatient Medications  Medication Sig Dispense Refill  . acetaminophen (TYLENOL) 650 MG CR tablet     . calcium-vitamin D (OSCAL WITH D) 500-200 MG-UNIT tablet Take 1 tablet by mouth.    . CRANBERRY PO Take 2 tablets by mouth 2 (two) times daily.    Marland Kitchen FIBER PO Take 1 each by mouth daily. Gummy    . fish oil-omega-3 fatty  acids 1000 MG capsule Take 2 g by mouth daily.    . Garlic Oil 9509 MG CAPS Take by mouth.    . Ibuprofen 200 MG CAPS Take by mouth as needed.    . Ibuprofen-Diphenhydramine Cit (ADVIL PM PO) Take 1 tablet by mouth as needed.    . Multiple Vitamins-Calcium (ONE-A-DAY WOMENS FORMULA) TABS Take by mouth.    . vitamin B-12 (CYANOCOBALAMIN) 100 MCG tablet Take 100 mcg by mouth daily.     No current facility-administered medications for this visit.    Review of Systems Review of Systems  All other systems reviewed and are negative.   Blood pressure 137/81, pulse 77, temperature 98.6 F (37 C), temperature source Oral, height 5\' 4"  (1.626 m), weight 181 lb 6.4 oz (82.3 kg), last menstrual period 07/01/2020, SpO2 99 %.  Physical Exam Physical Exam Constitutional:      General: She is not in acute distress.    Appearance: She is obese.  HENT:     Head: Normocephalic and atraumatic.  Eyes:     General: No scleral icterus.    Comments: Changes consistent with mild Graves' ophthalmopathy and subsequent decompression surgery.  Cardiovascular:     Rate and Rhythm: Normal rate and regular rhythm.  Pulmonary:     Effort: Pulmonary effort is normal. No respiratory distress.  Chest:  Breasts:     Right: Normal. No axillary adenopathy or supraclavicular adenopathy.     Left: Inverted nipple present. No nipple discharge, skin change, tenderness, axillary adenopathy or supraclavicular adenopathy.        Comments: There is a thickened area in the left breast in roughly the same anatomic location as described on her imaging studies. It is difficult to discriminate from normal dense breast tissue and is only really appreciated when it is captured between the examining fingertips and the chest wall. There is a small skin tag adjacent to the right nipple. Genitourinary:    Comments: Deferred Musculoskeletal:        General: No swelling or tenderness.  Lymphadenopathy:     Upper Body:     Right  upper body: No supraclavicular, axillary or pectoral adenopathy.     Left upper body: No supraclavicular, axillary or pectoral adenopathy.  Skin:    General: Skin is warm.  Neurological:     General: No focal deficit present.     Mental Status: She is alert and oriented to person, place, and time.  Psychiatric:        Mood and Affect: Mood normal.        Behavior: Behavior normal.     Data Reviewed See my history of present illness for the imaging studies and pathology that has been reviewed.  Assessment This is a 50 year old woman who I originally saw for a single intraductal papilloma in August 2021. Short-term surveillance was selected at that time. She underwent repeat imaging that identified a concerning area of the left breast 4 cm from the nipple at about the 11:30 position. I do think I can palpate this area on exam. She is scheduled to undergo biopsy later today. Of note, the imaging performed on the 25th was done at [facility] breast center and her prior imaging was done at a different institution so there may be discrepancies between the radiology interpretations.  Plan I will await the results of her biopsy and contact her with these results. We will make plans regarding further intervention and/or management at that time.  Greater than 50% of this 30-minute visit was spent in counseling and coordination of patient care.    [name] 07/15/2020, 10:42 AM

## 2020-07-16 ENCOUNTER — Telehealth: Payer: Self-pay | Admitting: General Surgery

## 2020-07-16 NOTE — Telephone Encounter (Signed)
Discussed the breast biopsy pathology with patient and her husband. They prefer breast conservation therapy.  They are awaiting an appointment with oncology.  For now, will plan on RFID-guided lumpectomy with [facility].  Will also address the intraductal papilloma at that time.

## 2020-07-17 NOTE — Progress Notes (Signed)
Spoke to patient and husband by phone. They were just speaking with Dr. Celine Ahr regarding surgery treatment plan.   Navigation initiated.  Medical Oncology consult to be scheduled 07/19/20.

## 2020-07-19 DIAGNOSIS — C50919 Malignant neoplasm of unspecified site of unspecified female breast: Secondary | ICD-10-CM

## 2020-07-20 ENCOUNTER — Other Ambulatory Visit: Payer: Self-pay

## 2020-07-20 ENCOUNTER — Other Ambulatory Visit: Payer: Managed Care, Other (non HMO)

## 2020-07-20 ENCOUNTER — Ambulatory Visit: Payer: Managed Care, Other (non HMO) | Admitting: Oncology

## 2020-07-20 DIAGNOSIS — D242 Benign neoplasm of left breast: Secondary | ICD-10-CM

## 2020-07-20 DIAGNOSIS — C50912 Malignant neoplasm of unspecified site of left female breast: Secondary | ICD-10-CM

## 2020-07-21 ENCOUNTER — Other Ambulatory Visit: Payer: Self-pay | Admitting: General Surgery

## 2020-07-21 ENCOUNTER — Encounter: Payer: Self-pay | Admitting: General Surgery

## 2020-07-21 MED ORDER — DIAZEPAM 5 MG PO TABS: 5.0000 mg | ORAL_TABLET | Freq: Once | ORAL | 0 refills | Status: AC

## 2020-07-22 ENCOUNTER — Other Ambulatory Visit: Payer: Self-pay

## 2020-07-22 ENCOUNTER — Inpatient Hospital Stay: Payer: Managed Care, Other (non HMO)

## 2020-07-22 ENCOUNTER — Ambulatory Visit
Admission: RE | Admit: 2020-07-22 | Discharge: 2020-07-22 | Disposition: A | Discharge status: Home / Self Care | Payer: Managed Care, Other (non HMO) | Source: Ambulatory Visit | Attending: General Surgery | Admitting: General Surgery

## 2020-07-22 ENCOUNTER — Encounter: Payer: Self-pay | Admitting: Oncology

## 2020-07-22 ENCOUNTER — Inpatient Hospital Stay: Payer: Managed Care, Other (non HMO) | Attending: Oncology | Admitting: Oncology

## 2020-07-22 DIAGNOSIS — D242 Benign neoplasm of left breast: Secondary | ICD-10-CM | POA: Diagnosis present

## 2020-07-22 DIAGNOSIS — Z8249 Family history of ischemic heart disease and other diseases of the circulatory system: Secondary | ICD-10-CM | POA: Insufficient documentation

## 2020-07-22 DIAGNOSIS — Z79899 Other long term (current) drug therapy: Secondary | ICD-10-CM | POA: Diagnosis not present

## 2020-07-22 DIAGNOSIS — C50912 Malignant neoplasm of unspecified site of left female breast: Secondary | ICD-10-CM | POA: Diagnosis present

## 2020-07-22 DIAGNOSIS — Z833 Family history of diabetes mellitus: Secondary | ICD-10-CM | POA: Diagnosis not present

## 2020-07-22 DIAGNOSIS — C50212 Malignant neoplasm of upper-inner quadrant of left female breast: Secondary | ICD-10-CM | POA: Diagnosis present

## 2020-07-22 DIAGNOSIS — Z8349 Family history of other endocrine, nutritional and metabolic diseases: Secondary | ICD-10-CM | POA: Insufficient documentation

## 2020-07-22 DIAGNOSIS — N6459 Other signs and symptoms in breast: Secondary | ICD-10-CM | POA: Diagnosis not present

## 2020-07-22 DIAGNOSIS — N6342 Unspecified lump in left breast, subareolar: Secondary | ICD-10-CM | POA: Insufficient documentation

## 2020-07-22 DIAGNOSIS — N6002 Solitary cyst of left breast: Secondary | ICD-10-CM | POA: Insufficient documentation

## 2020-07-22 DIAGNOSIS — Z803 Family history of malignant neoplasm of breast: Secondary | ICD-10-CM | POA: Diagnosis not present

## 2020-07-22 DIAGNOSIS — Z170 Estrogen receptor positive status [ER+]: Secondary | ICD-10-CM | POA: Diagnosis not present

## 2020-07-22 DIAGNOSIS — N6321 Unspecified lump in the left breast, upper outer quadrant: Secondary | ICD-10-CM | POA: Diagnosis not present

## 2020-07-22 DIAGNOSIS — Z8042 Family history of malignant neoplasm of prostate: Secondary | ICD-10-CM | POA: Insufficient documentation

## 2020-07-22 DIAGNOSIS — N6325 Unspecified lump in the left breast, overlapping quadrants: Secondary | ICD-10-CM | POA: Diagnosis not present

## 2020-07-22 DIAGNOSIS — E785 Hyperlipidemia, unspecified: Secondary | ICD-10-CM | POA: Insufficient documentation

## 2020-07-22 LAB — SURGICAL PATHOLOGY

## 2020-07-22 MED ORDER — GADOBUTROL 1 MMOL/ML IV SOLN
7.5000 mL | Freq: Once | INTRAVENOUS | Status: AC | PRN
  Administered 2020-07-22: 7.5 mL via INTRAVENOUS

## 2020-07-22 NOTE — Progress Notes (Signed)
New pt referred for breast carcinoma by Dr Celine Ahr.  Husband is with pt today.

## 2020-07-23 DIAGNOSIS — C50212 Malignant neoplasm of upper-inner quadrant of left female breast: Secondary | ICD-10-CM | POA: Insufficient documentation

## 2020-07-23 NOTE — Progress Notes (Signed)
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  INTERVAL HISTORY: Patient is a 50 year old female who was noted to have an abnormality on routine screening mammogram.  Subsequent ultrasound biopsy revealed the above-stated malignancy.  She currently feels well and is asymptomatic.  She has no neurologic complaints.  She denies any recent fevers or illnesses.  She has a good appetite and denies weight loss.  She has no chest pain, shortness of breath, cough, or hemoptysis.  She denies any nausea, vomiting, constipation, or diarrhea.  She has no urinary complaints.  Patient feels at her baseline offers no specific complaints today.  REVIEW OF SYSTEMS:   Review of Systems  Constitutional: Negative.  Negative for fever, malaise/fatigue and weight loss.  Respiratory: Negative.  Negative for cough, hemoptysis and shortness of breath.   Cardiovascular: Negative.  Negative for chest pain and leg swelling.  Gastrointestinal: Negative.  Negative for abdominal pain.  Genitourinary: Negative.  Negative for dysuria.  Musculoskeletal: Negative.  Negative for back pain.  Skin: Negative.  Negative for rash.  Neurological: Negative.  Negative for dizziness, focal weakness and weakness.  Psychiatric/Behavioral: Negative.  The patient is not nervous/anxious.     As per HPI. Otherwise, a complete review of systems is negative.  PAST MEDICAL HISTORY: Past Medical History:  Diagnosis Date  . Cardiac disease   . Diffuse cystic  mastopathy   . Family history of malignant neoplasm of breast   . Graves disease    In remission  . Hyperlipidemia   . Hyperthyroidism   . PONV (postoperative nausea and vomiting)     PAST SURGICAL HISTORY: Past Surgical History:  Procedure Laterality Date  . BREAST BIOPSY Right 2008   benign  . BREAST BIOPSY Left 2/13    aspiration cyst  . BREAST BIOPSY Left 01/2020   Sclerosing intraductal papilloma with apocrine metaplasia. No atypia or malignancy.  Marland Kitchen BREAST BIOPSY Left 07/15/2020   [place] bx, heart marker, path pending  . COLONOSCOPY WITH PROPOFOL N/A 07/12/2020   Procedure: COLONOSCOPY WITH PROPOFOL;  Surgeon: Lucilla Lame, MD;  Location: [facility];  Service: Endoscopy;  Laterality: N/A;  PRIORITY 4  . DILATION AND CURETTAGE OF UTERUS  2011  . eyelid surgery     . ORBITAL RECONSTRUCTION Left 2008  . POLYPECTOMY  07/12/2020   Procedure: POLYPECTOMY;  Surgeon: Lucilla Lame, MD;  Location: [facility];  Service: Endoscopy;;  . WISDOM TOOTH EXTRACTION      FAMILY HISTORY: Family History  Problem Relation Age of Onset  . Diabetes Mother   . Hypertension Mother   . Hyperlipidemia Mother   . Hypertension Father   . Hyperlipidemia Father   . Hypothyroidism Father   . Prostate cancer Father   . Hyperlipidemia Sister   . Hypertension Sister   . Hyperlipidemia Sister   . Hypertension Sister   . Diabetes Sister   . Hyperlipidemia Sister   . Hypertension Sister   . Breast cancer Maternal Grandmother     ADVANCED DIRECTIVES (Y/N):  N  HEALTH MAINTENANCE: Social History   Tobacco Use  .  Smoking status: Never Smoker  . Smokeless tobacco: Never Used  Vaping Use  . Vaping Use: Never used  Substance Use Topics  . Alcohol use: Yes    Comment: occasional (2x/yr)  . Drug use: No     Colonoscopy:  PAP:  Bone density:  Lipid panel:  No Known Allergies  Current Outpatient Medications  Medication Sig Dispense Refill  . acetaminophen (TYLENOL) 650 MG  CR tablet     . calcium-vitamin D (OSCAL WITH D) 500-200 MG-UNIT tablet Take 1 tablet by mouth.    . CRANBERRY PO Take 2 tablets by mouth 2 (two) times daily.    Marland Kitchen FIBER PO Take 1 each by mouth daily. Gummy    . fish oil-omega-3 fatty acids 1000 MG capsule Take 2 g by mouth daily.    . Garlic Oil 1000 MG CAPS Take by mouth.    . Ibuprofen 200 MG CAPS Take by mouth as needed.    . Ibuprofen-Diphenhydramine Cit (ADVIL PM PO) Take 1 tablet by mouth as needed.    . Multiple Vitamins-Calcium (ONE-A-DAY WOMENS FORMULA) TABS Take by mouth.    . vitamin B-12 (CYANOCOBALAMIN) 100 MCG tablet Take 100 mcg by mouth daily.     No current facility-administered medications for this visit.    OBJECTIVE: Vitals:   07/22/20 1533  BP: (!) 146/85  Pulse: 88  Resp: 16  Temp: (!) 97.3 F (36.3 C)  SpO2: 100%     Body mass index is 31.41 kg/m.    ECOG FS:0 - Asymptomatic  General: Well-developed, well-nourished, no acute distress. Eyes: Pink conjunctiva, anicteric sclera. HEENT: Normocephalic, moist mucous membranes. Breast: Exam deferred today. Lungs: No audible wheezing or coughing. Heart: Regular rate and rhythm. Abdomen: Soft, nontender, no obvious distention. Musculoskeletal: No edema, cyanosis, or clubbing. Neuro: Alert, answering all questions appropriately. Cranial nerves grossly intact. Skin: No rashes or petechiae noted. Psych: Normal affect. Lymphatics: No cervical, calvicular, axillary or inguinal LAD.   LAB RESULTS:  Lab Results  Component Value Date   NA 138 05/18/2020   K 4.3 05/18/2020   CL 101 05/18/2020   CO2 22 05/18/2020   GLUCOSE 100 (H) 05/18/2020   BUN 13 05/18/2020   CREATININE 0.55 (L) 05/18/2020   CALCIUM 9.4 05/18/2020   PROT 7.2 05/18/2020   ALBUMIN 4.8 05/18/2020   AST 14 05/18/2020   ALT 23 05/18/2020   ALKPHOS 124 (H) 05/18/2020   BILITOT 0.4 05/18/2020   GFRNONAA 110 05/18/2020   GFRAA 126 05/18/2020    Lab Results  Component Value Date   WBC  13.0 (H) 05/18/2020   NEUTROABS 9.0 (H) 05/18/2020   HGB 13.8 05/18/2020   HCT 41.7 05/18/2020   MCV 89 05/18/2020   PLT 389 05/18/2020     STUDIES: US BREAST LTD UNI LEFT INC AXILLA  Result Date: 07/13/2020 CLINICAL DATA:  The patient presented with nipple inversion January 09, 2020. An intraductal mass was identified and biopsied demonstrating a papilloma. A six-month follow-up was recommended. The patient has a known mass at approximately 6 o'clock in the left breast which is been stable for many years and presumed benign. EXAM: DIGITAL DIAGNOSTIC BILATERAL MAMMOGRAM WITH CAD AND TOMOSYNTHESIS ULTRASOUND LEFT BREAST TECHNIQUE: Left digital diagnostic mammography and breast tomosynthesis was performed. Digital images of the breasts were evaluated with computer-aided detection. Targeted ultrasound examination of the Left breast was performed. COMPARISON:  Previous exam(s). ACR Breast Density Category c: The breast tissue is heterogeneously dense, which may obscure small masses. FINDINGS:  There is an apparent obscured mass in the lateral left breast. There is distortion between 11 o'clock and 1 o'clock in the superior left breast at a mid depth extending towards the nipple. The biopsy clip is located within a retroareolar mass which is stable. The known stable 6 o'clock mass is unchanged on the left Targeted ultrasound is performed, showing a minimally complicated cyst in the lateral left breast at 2:30, 4 cm from the nipple accounting for the obscured mass. The known previously biopsied intraductal papilloma measures 7 x 9 x 5 mm, unchanged. There is an irregular mass with shadowing in the left breast at 11:30, 4 cm from the nipple measures 8 x 11 by 13 mm, likely correlating with the known distortion and resulting nipple inversion. No axillary adenopathy. IMPRESSION: There is a suspicious mass resulting in left breast distortion at 11:30, 4 cm from the nipple. I suspect the sonographically measured size  of 8 x 11 x 13 mm may not accurately represent the true size of the abnormality. The intraductal papilloma measures a little smaller in the interval, likely due to interval biopsy. No other suspicious findings. RECOMMENDATION: Recommend ultrasound-guided biopsy of the irregular mass in the left breast at 11:30. Recommend clip placement with follow-up mammography to ensure this mass correlates with the left breast distortion. If it does not, recommend stereotactic biopsy of the distortion. If malignancy is identified as suspected, recommend MRI prior to surgery if breast conservation is being considered. If the patient proceeds to surgery for the irregular mass, recommend surgical excision of the intraductal papilloma. I have discussed the findings and recommendations with the patient. If applicable, a reminder letter will be sent to the patient regarding the next appointment. BI-RADS CATEGORY  5: Highly suggestive of malignancy. Electronically Signed   By: Dorise Bullion III M.D   On: 07/13/2020 15:23   MM DIAG BREAST TOMO UNI LEFT  Result Date: 07/13/2020 CLINICAL DATA:  The patient presented with nipple inversion January 09, 2020. An intraductal mass was identified and biopsied demonstrating a papilloma. A six-month follow-up was recommended. The patient has a known mass at approximately 6 o'clock in the left breast which is been stable for many years and presumed benign. EXAM: DIGITAL DIAGNOSTIC BILATERAL MAMMOGRAM WITH CAD AND TOMOSYNTHESIS ULTRASOUND LEFT BREAST TECHNIQUE: Left digital diagnostic mammography and breast tomosynthesis was performed. Digital images of the breasts were evaluated with computer-aided detection. Targeted ultrasound examination of the Left breast was performed. COMPARISON:  Previous exam(s). ACR Breast Density Category c: The breast tissue is heterogeneously dense, which may obscure small masses. FINDINGS: There is an apparent obscured mass in the lateral left breast. There is  distortion between 11 o'clock and 1 o'clock in the superior left breast at a mid depth extending towards the nipple. The biopsy clip is located within a retroareolar mass which is stable. The known stable 6 o'clock mass is unchanged on the left Targeted ultrasound is performed, showing a minimally complicated cyst in the lateral left breast at 2:30, 4 cm from the nipple accounting for the obscured mass. The known previously biopsied intraductal papilloma measures 7 x 9 x 5 mm, unchanged. There is an irregular mass with shadowing in the left breast at 11:30, 4 cm from the nipple measures 8 x 11 by 13 mm, likely correlating with the known distortion and resulting nipple inversion. No axillary adenopathy. IMPRESSION: There is a suspicious mass resulting in left breast distortion at 11:30, 4 cm from the nipple. I suspect the sonographically measured  size of 8 x 11 x 13 mm may not accurately represent the true size of the abnormality. The intraductal papilloma measures a little smaller in the interval, likely due to interval biopsy. No other suspicious findings. RECOMMENDATION: Recommend ultrasound-guided biopsy of the irregular mass in the left breast at 11:30. Recommend clip placement with follow-up mammography to ensure this mass correlates with the left breast distortion. If it does not, recommend stereotactic biopsy of the distortion. If malignancy is identified as suspected, recommend MRI prior to surgery if breast conservation is being considered. If the patient proceeds to surgery for the irregular mass, recommend surgical excision of the intraductal papilloma. I have discussed the findings and recommendations with the patient. If applicable, a reminder letter will be sent to the patient regarding the next appointment. BI-RADS CATEGORY  5: Highly suggestive of malignancy. Electronically Signed   By: Gerome Sam III M.D   On: 07/13/2020 15:23   MM CLIP PLACEMENT LEFT  Result Date: 07/15/2020 CLINICAL DATA:   Evaluate biopsy marker EXAM: DIAGNOSTIC LEFT MAMMOGRAM POST ULTRASOUND BIOPSY COMPARISON:  Previous exam(s). FINDINGS: Mammographic images were obtained following ultrasound guided biopsy of a left breast mass. The heart shaped marker is within the biopsied distortion. IMPRESSION: Appropriate positioning of the heart shaped biopsy marking clip at the site of biopsy in the biopsy distortion. Final Assessment: Post Procedure Mammograms for Marker Placement Electronically Signed   By: Gerome Sam III M.D   On: 07/15/2020 14:29   [place] LT BREAST BX W LOC DEV 1ST LESION IMG BX SPEC US GUIDE  Addendum Date: 07/19/2020   ADDENDUM REPORT: 07/19/2020 10:57 ADDENDUM: PATHOLOGY revealed: A. LEFT BREAST, 11:30 4 CMFN; ULTRASOUND-GUIDED BIOPSY: - INVASIVE MAMMARY CARCINOMA WITH MIXED DUCTAL AND LOBULAR FEATURES. - ADJACENT LOBULAR CARCINOMA IN SITU. Size of invasive carcinoma: 11 mm in this sample. Grade 1. Ductal carcinoma in situ: Not identified. Lymphovascular invasion: Not identified. Pathology results are CONCORDANT with imaging findings, per Dr. Gerome Sam. Pathology results and recommendations below were discussed with patient by telephone on 07/16/2020. Patient reported biopsy site within normal limits with slight tenderness at the site. Post biopsy care instructions were reviewed, questions were answered and my direct phone number was provided to patient. Patient was instructed to call [facility] if any concerns or questions arise related to the biopsy. Recommendations: 1. Recommend surgical consultation: Request for surgical consultation relayed to Coralee Rud RN and Molli Posey RN at [facility] by Randa Lynn RN on 07/16/2020. 2. 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 3. Recommend bilateral breast MRI if breast conservation desired to determine extent of breast disease. Mammography and ultrasound may under represent  the extent of disease. Pathology results reported by Randa Lynn RN on 07/19/2020. Electronically Signed   By: Gerome Sam III M.D   On: 07/19/2020 10:57   Result Date: 07/19/2020 CLINICAL DATA:  Biopsy left breast mass/distortion EXAM: ULTRASOUND GUIDED LEFT BREAST CORE NEEDLE BIOPSY COMPARISON:  Previous exam(s). PROCEDURE: I met with the patient and we discussed the procedure of ultrasound-guided biopsy, including benefits and alternatives. We discussed the high likelihood of a successful procedure. We discussed the risks of the procedure, including infection, bleeding, tissue injury, clip migration, and inadequate sampling. Informed written consent was given. The usual time-out protocol was performed immediately prior to the procedure. Lesion quadrant: 11 30 left breast Using sterile technique and 1% Lidocaine as local anesthetic, under direct ultrasound visualization, a 12 gauge spring-loaded device  was used to perform biopsy of distortion/mass at 11:30 in the left breast using a medial approach. At the conclusion of the procedure a heart shaped tissue marker clip was deployed into the biopsy cavity. Follow up 2 view mammogram was performed and dictated separately. IMPRESSION: Ultrasound guided biopsy of a mass/distortion in the left breast. No apparent complications. 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  PLAN:    1. 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  MRI results from July 22, 2020 are pending at time of dictation.  It is unlikely patient will require adjuvant chemotherapy, but will order Oncotype DX on surgical specimen for completeness.  Will  arrange follow-up with radiation oncology for adjuvant XRT.  Patient will also benefit from either tamoxifen or an aromatase inhibitor for a minimum of 5 years at the completion of all her treatments.  Return to clinic approximately 2 weeks after her surgery to discuss her final pathology results and treatment planning. 2.  Genetic testing: Given patient's age, will further discuss with genetic counseling if testing is indicated.  I spent a total of 60 minutes reviewing chart data, face-to-face evaluation with the patient, counseling and coordination of care as detailed above.  Patient expressed understanding and was in agreement with this plan. She also understands that She can call clinic at any time with any questions, concerns, or complaints.   Cancer Staging 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, left [facility]) Staging form: Breast, AJCC 8th Edition - Clinical stage from 07/23/2020: Stage IA (cT1c, cN0, cM0, G1, ER+, PR+, HER2-) - Signed by Lloyd Huger, MD on 07/23/2020 Stage prefix: Initial diagnosis   Lloyd Huger, MD   07/23/2020 6:33 AM

## 2020-07-27 ENCOUNTER — Other Ambulatory Visit: Payer: Self-pay | Admitting: General Surgery

## 2020-07-27 DIAGNOSIS — C50912 Malignant neoplasm of unspecified site of left female breast: Secondary | ICD-10-CM

## 2020-07-27 DIAGNOSIS — C50212 Malignant neoplasm of upper-inner quadrant of left female breast: Secondary | ICD-10-CM

## 2020-07-27 DIAGNOSIS — D242 Benign neoplasm of left breast: Secondary | ICD-10-CM

## 2020-07-28 ENCOUNTER — Encounter: Payer: Self-pay | Admitting: General Surgery

## 2020-07-29 ENCOUNTER — Telehealth: Payer: Self-pay | Admitting: General Surgery

## 2020-07-29 ENCOUNTER — Other Ambulatory Visit: Payer: Self-pay | Admitting: General Surgery

## 2020-07-29 DIAGNOSIS — C50212 Malignant neoplasm of upper-inner quadrant of left female breast: Secondary | ICD-10-CM

## 2020-07-29 NOTE — Telephone Encounter (Signed)
Patient has been advised of Pre-Admission date/time, COVID Testing date and Surgery date.  Surgery Date: 08/09/20 Preadmission Testing Date: 08/02/20 (phone 1p-5p) Covid Testing Date: 08/05/20 - patient advised to go to the [facility] ([place]) between 8a-1p  Patient also reminded of her appt with [facility] for RF tags 08/06/20  Patient has been made aware to arrive 10:15 am on day of surgery 08/09/20 as she will be having SNL bx done first, then to day surgery after that.  Patient voices understanding with all.

## 2020-08-02 ENCOUNTER — Other Ambulatory Visit: Payer: Self-pay

## 2020-08-02 ENCOUNTER — Encounter
Admission: RE | Admit: 2020-08-02 | Discharge: 2020-08-02 | Disposition: A | Discharge status: Home / Self Care | Payer: Managed Care, Other (non HMO) | Source: Ambulatory Visit | Attending: General Surgery | Admitting: General Surgery

## 2020-08-02 DIAGNOSIS — Z01812 Encounter for preprocedural laboratory examination: Secondary | ICD-10-CM | POA: Diagnosis not present

## 2020-08-02 HISTORY — DX: Personal history of urinary calculi: Z87.442

## 2020-08-02 NOTE — Patient Instructions (Signed)
INSTRUCTIONS FOR SURGERY     Your surgery is scheduled for:   Monday, February 21ST     To find out your arrival time for the day of surgery,          please call [phone] between 1 pm and 3 pm on :  Friday, February 18TH     When you arrive for surgery, report to the [facility].      THEY WILL GET YOU TO MAMMOGRAPHY AND EVENTUALLY YOU WILL GET TO THE        SURGICAL AREA.  Scott, please go to the 2nd floor of the medical mall to the waiting area       Once you have dropped off [employer] and gotten some food!!    REMEMBER: Instructions that are not followed completely may result in serious medical risk,  up to and including death, or upon the discretion of your surgeon and anesthesiologist,            your surgery may need to be rescheduled.  __X__ 1. Do not eat food after midnight the night before your procedure.                    No gum, candy, lozenger, tic tacs, tums or hard candies.                  ABSOLUTELY NOTHING SOLID IN YOUR MOUTH AFTER MIDNIGHT                    You may drink unlimited clear liquids up to 2 hours before you are scheduled to arrive for surgery.                   Do not drink anything within those 2 hours unless you need to take medicine, then take the                   smallest amount you need.  Clear liquids include:  water, apple juice without pulp,                   any flavor Gatorade, Black coffee, black tea.  Sugar may be added but no dairy/ honey /lemon.                        Broth and jello is not considered a clear liquid.  __x__  2. On the morning of surgery, please brush your teeth with toothpaste and water. You may rinse with                  mouthwash if you wish but DO NOT SWALLOW TOOTHPASTE OR MOUTHWASH  __X___3. NO alcohol for 24 hours before or after surgery.  __x___ 4.  Do NOT smoke or use e-cigarettes for 24 HOURS PRIOR TO SURGERY.                       DO NOT Use any chewable tobacco products for at least 6 hours prior to surgery.  __x___ 5. If you start any new medication after this appointment  and prior to surgery, please                   Bring it with you on the day of surgery.  ___x__ 6. Notify your doctor if there is any change in your medical condition, such as fever,                   nfection, vomitting, diarrhea or any open sores.  __x___ 7.  USE the CHG SOAP as instructed, the night before surgery and the day of surgery.                   Once you have washed with this soap, do NOT use any of the following: Powders, perfumes                    or lotions. Please do not wear make up, hairpins, clips or nail polish. You may not wear deodorant.                   Men may shave their face and neck.  Women need to shave 48 hours prior to surgery.                   DO NOT wear ANY jewelry on the day of surgery. If there are rings that are too tight to                    remove easily, please address this prior to the surgery day. Piercings need to be removed.                                                                     NO METAL ON YOUR BODY.                    Do NOT bring any valuables.  If you came to Pre-Admit testing then you will not need license,                     insurance card or credit card.  If you will be staying overnight, please either leave your things in                     the car or have your family be responsible for these items.                     [facility] IS NOT RESPONSIBLE FOR BELONGINGS OR VALUABLES.  ___X__ 8. DO NOT wear contact lenses on surgery day.  You may not have dentures,                     Hearing aides, contacts or glasses in the operating room. These items can be                    Placed in the Recovery Room to receive immediately after surgery.  __x___ 9. IF YOU ARE SCHEDULED TO GO HOME ON THE SAME DAY, YOU MUST                   Have someone to drive you home and to  stay with you  for  the first 24 hours.                    Have an arrangement prior to arriving on surgery day.  ___x__ 10. Take the following medications on the morning of surgery with a sip of water:                              1. nothing                     2.                     3.  _____ 11.  Follow any instructions provided to you by your surgeon.                        Such as enema, clear liquid bowel prep  __X__  12. STOP ALL ASPIRIN PRODUCTS AS OF TODAY, February 14TH                       THIS INCLUDES BC POWDERS / GOODIES POWDER  __x___ 13. STOP Anti-inflammatories as of TODAY, February 14TH                      This includes IBUPROFEN / MOTRIN / ADVIL / ALEVE/ NAPROXYN                    YOU MAY TAKE TYLENOL ANY TIME PRIOR TO SURGERY.  _X____ 14.  Stop supplements until after surgery.                     This includes: OSCAL WITH D // CRANBERRY // Page // GARLIC OIL //                         ONE A DAY VITAMINS // ADVIL PM                 You may continue taking Vitamin D3 but do not take on the morning of surgery.  ___X___18.  Wear clean and comfortable clothing to the hospital.  PLEASE WEAR A LOOSE AND COMFORTABLE SHIRT THAT IS EASY TO GET ON. ALSO, BRING A FRONT OPENING SPORTS BRA TO PUT ON AFTER SURGERY.  BOTH YOU AND SCOTT MUST HAVE A FACE [place].

## 2020-08-05 ENCOUNTER — Other Ambulatory Visit
Admission: RE | Admit: 2020-08-05 | Discharge: 2020-08-05 | Disposition: A | Discharge status: Home / Self Care | Payer: Managed Care, Other (non HMO) | Source: Ambulatory Visit | Attending: General Surgery | Admitting: General Surgery

## 2020-08-05 ENCOUNTER — Other Ambulatory Visit: Payer: Self-pay

## 2020-08-05 DIAGNOSIS — Z01812 Encounter for preprocedural laboratory examination: Secondary | ICD-10-CM | POA: Diagnosis present

## 2020-08-05 DIAGNOSIS — Z20822 Contact with and (suspected) exposure to covid-19: Secondary | ICD-10-CM | POA: Diagnosis not present

## 2020-08-05 LAB — SARS CORONAVIRUS 2 (TAT 6-24 HRS): SARS Coronavirus 2: NEGATIVE

## 2020-08-06 ENCOUNTER — Other Ambulatory Visit: Payer: Self-pay | Admitting: General Surgery

## 2020-08-06 ENCOUNTER — Ambulatory Visit
Admission: RE | Admit: 2020-08-06 | Discharge: 2020-08-06 | Disposition: A | Discharge status: Home / Self Care | Payer: Managed Care, Other (non HMO) | Source: Ambulatory Visit | Attending: General Surgery | Admitting: General Surgery

## 2020-08-06 ENCOUNTER — Telehealth: Payer: Self-pay | Admitting: *Deleted

## 2020-08-06 DIAGNOSIS — D242 Benign neoplasm of left breast: Secondary | ICD-10-CM

## 2020-08-06 NOTE — Telephone Encounter (Signed)
Faxed FMLA to [facility] at [phone]

## 2020-08-08 MED ORDER — CELECOXIB 200 MG PO CAPS: 200.0000 mg | ORAL_CAPSULE | ORAL | Status: AC

## 2020-08-08 MED ORDER — GABAPENTIN 300 MG PO CAPS: 300.0000 mg | ORAL_CAPSULE | ORAL | Status: AC

## 2020-08-08 MED ORDER — CHLORHEXIDINE GLUCONATE CLOTH 2 % EX PADS: 6.0000 | MEDICATED_PAD | Freq: Once | CUTANEOUS | Status: DC

## 2020-08-08 MED ORDER — FAMOTIDINE 20 MG PO TABS: 20.0000 mg | ORAL_TABLET | Freq: Once | ORAL | Status: AC

## 2020-08-08 MED ORDER — SCOPOLAMINE 1 MG/3DAYS TD PT72: 1.0000 | MEDICATED_PATCH | TRANSDERMAL | Status: DC

## 2020-08-08 MED ORDER — LACTATED RINGERS IV SOLN: INTRAVENOUS | Status: DC

## 2020-08-08 MED ORDER — ACETAMINOPHEN 500 MG PO TABS: 1000.0000 mg | ORAL_TABLET | ORAL | Status: AC

## 2020-08-08 MED ORDER — ORAL CARE MOUTH RINSE: 15.0000 mL | Freq: Once | OROMUCOSAL | Status: AC

## 2020-08-08 MED ORDER — CEFAZOLIN SODIUM-DEXTROSE 2-4 GM/100ML-% IV SOLN
2.0000 g | INTRAVENOUS | Status: AC
  Administered 2020-08-09: 2 g via INTRAVENOUS

## 2020-08-08 MED ORDER — CHLORHEXIDINE GLUCONATE 0.12 % MT SOLN: 15.0000 mL | Freq: Once | OROMUCOSAL | Status: AC

## 2020-08-08 MED ORDER — BUPIVACAINE LIPOSOME 1.3 % IJ SUSP: 20.0000 mL | Freq: Once | INTRAMUSCULAR | Status: DC

## 2020-08-09 ENCOUNTER — Other Ambulatory Visit: Payer: Self-pay

## 2020-08-09 ENCOUNTER — Ambulatory Visit
Admission: RE | Admit: 2020-08-09 | Discharge: 2020-08-09 | Disposition: A | Discharge status: Home / Self Care | Payer: Managed Care, Other (non HMO) | Source: Ambulatory Visit | Attending: General Surgery | Admitting: General Surgery

## 2020-08-09 ENCOUNTER — Ambulatory Visit: Payer: Managed Care, Other (non HMO) | Admitting: Anesthesiology

## 2020-08-09 ENCOUNTER — Encounter: Payer: Self-pay | Admitting: General Surgery

## 2020-08-09 ENCOUNTER — Encounter
Admission: RE | Disposition: A | Discharge status: Home / Self Care | Payer: Self-pay | Source: Home / Self Care | Attending: General Surgery

## 2020-08-09 ENCOUNTER — Ambulatory Visit
Admission: RE | Admit: 2020-08-09 | Discharge: 2020-08-09 | Disposition: A | Discharge status: Home / Self Care | Payer: Managed Care, Other (non HMO) | Attending: General Surgery | Admitting: General Surgery

## 2020-08-09 DIAGNOSIS — Z803 Family history of malignant neoplasm of breast: Secondary | ICD-10-CM | POA: Diagnosis not present

## 2020-08-09 DIAGNOSIS — C50912 Malignant neoplasm of unspecified site of left female breast: Secondary | ICD-10-CM | POA: Insufficient documentation

## 2020-08-09 DIAGNOSIS — C773 Secondary and unspecified malignant neoplasm of axilla and upper limb lymph nodes: Secondary | ICD-10-CM | POA: Diagnosis not present

## 2020-08-09 DIAGNOSIS — C50312 Malignant neoplasm of lower-inner quadrant of left female breast: Secondary | ICD-10-CM

## 2020-08-09 DIAGNOSIS — Z170 Estrogen receptor positive status [ER+]: Secondary | ICD-10-CM | POA: Insufficient documentation

## 2020-08-09 DIAGNOSIS — C50212 Malignant neoplasm of upper-inner quadrant of left female breast: Secondary | ICD-10-CM

## 2020-08-09 DIAGNOSIS — D242 Benign neoplasm of left breast: Secondary | ICD-10-CM | POA: Diagnosis not present

## 2020-08-09 HISTORY — PX: BREAST DUCTAL SYSTEM EXCISION: SHX5242

## 2020-08-09 HISTORY — PX: BREAST LUMPECTOMY,RADIO FREQ LOCALIZER,AXILLARY SENTINEL LYMPH NODE BIOPSY: SHX6900

## 2020-08-09 LAB — POCT PREGNANCY, URINE: Preg Test, Ur: NEGATIVE

## 2020-08-09 SURGERY — BREAST LUMPECTOMY,RADIO FREQ LOCALIZER,AXILLARY SENTINEL LYMPH NODE BIOPSY
Anesthesia: General | Laterality: Left

## 2020-08-09 MED ORDER — TECHNETIUM TC 99M TILMANOCEPT KIT
1.0000 | PACK | Freq: Once | INTRAVENOUS | Status: AC | PRN
  Administered 2020-08-09: 1.0202 via INTRADERMAL

## 2020-08-09 MED ORDER — SODIUM CHLORIDE 0.9 % IV SOLN
INTRAVENOUS | Status: DC | PRN
  Administered 2020-08-09: 50 ug/min via INTRAVENOUS

## 2020-08-09 MED ORDER — SUCCINYLCHOLINE CHLORIDE 200 MG/10ML IV SOSY
PREFILLED_SYRINGE | INTRAVENOUS | Status: AC
  Filled 2020-08-09: qty 10

## 2020-08-09 MED ORDER — ONDANSETRON HCL 4 MG/2ML IJ SOLN
INTRAMUSCULAR | Status: AC
  Filled 2020-08-09: qty 2

## 2020-08-09 MED ORDER — FENTANYL CITRATE (PF) 100 MCG/2ML IJ SOLN: 25.0000 ug | INTRAMUSCULAR | Status: DC | PRN

## 2020-08-09 MED ORDER — CELECOXIB 200 MG PO CAPS
ORAL_CAPSULE | ORAL | Status: AC
  Administered 2020-08-09: 200 mg via ORAL
  Filled 2020-08-09: qty 1

## 2020-08-09 MED ORDER — BUPIVACAINE HCL (PF) 0.25 % IJ SOLN
INTRAMUSCULAR | Status: AC
  Filled 2020-08-09: qty 30

## 2020-08-09 MED ORDER — BSS IO SOLN
15.0000 mL | Freq: Once | INTRAOCULAR | Status: DC
  Filled 2020-08-09: qty 15

## 2020-08-09 MED ORDER — ACETAMINOPHEN 500 MG PO TABS
ORAL_TABLET | ORAL | Status: AC
  Administered 2020-08-09: 1000 mg via ORAL
  Filled 2020-08-09: qty 2

## 2020-08-09 MED ORDER — IBUPROFEN 200 MG PO CAPS: 800.0000 mg | ORAL_CAPSULE | Freq: Four times a day (QID) | ORAL | 0 refills | Status: DC | PRN

## 2020-08-09 MED ORDER — POLYMYXIN B-TRIMETHOPRIM 10000-0.1 UNIT/ML-% OP SOLN
1.0000 [drp] | Freq: Three times a day (TID) | OPHTHALMIC | Status: DC
Start: 2020-08-09 — End: 2020-08-09

## 2020-08-09 MED ORDER — SCOPOLAMINE 1 MG/3DAYS TD PT72
MEDICATED_PATCH | TRANSDERMAL | Status: AC
  Administered 2020-08-09: 1.5 mg via TRANSDERMAL
  Filled 2020-08-09: qty 1

## 2020-08-09 MED ORDER — BUPIVACAINE HCL (PF) 0.5 % IJ SOLN
INTRAMUSCULAR | Status: AC
  Filled 2020-08-09: qty 30

## 2020-08-09 MED ORDER — ONDANSETRON HCL 4 MG/2ML IJ SOLN: 4.0000 mg | Freq: Once | INTRAMUSCULAR | Status: DC | PRN

## 2020-08-09 MED ORDER — KETOROLAC TROMETHAMINE 0.5 % OP SOLN
1.0000 [drp] | Freq: Three times a day (TID) | OPHTHALMIC | Status: DC | PRN
Start: 2020-08-09 — End: 2020-08-09
  Administered 2020-08-09: 1 [drp] via OPHTHALMIC
  Filled 2020-08-09: qty 3

## 2020-08-09 MED ORDER — ONDANSETRON HCL 4 MG/2ML IJ SOLN
INTRAMUSCULAR | Status: DC | PRN
  Administered 2020-08-09: 4 mg via INTRAVENOUS

## 2020-08-09 MED ORDER — MIDAZOLAM HCL 2 MG/2ML IJ SOLN
INTRAMUSCULAR | Status: AC
  Filled 2020-08-09: qty 2

## 2020-08-09 MED ORDER — PROPOFOL 10 MG/ML IV BOLUS
INTRAVENOUS | Status: DC | PRN
  Administered 2020-08-09: 50 mg via INTRAVENOUS
  Administered 2020-08-09: 150 mg via INTRAVENOUS

## 2020-08-09 MED ORDER — LIDOCAINE-EPINEPHRINE 1 %-1:100000 IJ SOLN
INTRAMUSCULAR | Status: DC | PRN
  Administered 2020-08-09: 9 mL via INTRAMUSCULAR

## 2020-08-09 MED ORDER — GLYCOPYRROLATE 0.2 MG/ML IJ SOLN
INTRAMUSCULAR | Status: DC | PRN
  Administered 2020-08-09: .2 mg via INTRAVENOUS

## 2020-08-09 MED ORDER — DEXAMETHASONE SODIUM PHOSPHATE 10 MG/ML IJ SOLN
INTRAMUSCULAR | Status: DC | PRN
  Administered 2020-08-09: 10 mg via INTRAVENOUS

## 2020-08-09 MED ORDER — CEFAZOLIN SODIUM-DEXTROSE 2-4 GM/100ML-% IV SOLN
INTRAVENOUS | Status: AC
  Filled 2020-08-09: qty 100

## 2020-08-09 MED ORDER — EPHEDRINE SULFATE 50 MG/ML IJ SOLN
INTRAMUSCULAR | Status: DC | PRN
  Administered 2020-08-09: 5 mg via INTRAVENOUS
  Administered 2020-08-09: 10 mg via INTRAVENOUS

## 2020-08-09 MED ORDER — GABAPENTIN 300 MG PO CAPS
ORAL_CAPSULE | ORAL | Status: AC
  Administered 2020-08-09: 300 mg via ORAL
  Filled 2020-08-09: qty 1

## 2020-08-09 MED ORDER — ISOSULFAN BLUE 1 % ~~LOC~~ SOLN
SUBCUTANEOUS | Status: DC | PRN
  Administered 2020-08-09: 5 mL via SUBCUTANEOUS

## 2020-08-09 MED ORDER — PHENYLEPHRINE HCL (PRESSORS) 10 MG/ML IV SOLN
INTRAVENOUS | Status: AC
  Filled 2020-08-09: qty 1

## 2020-08-09 MED ORDER — OXYCODONE HCL 5 MG PO TABS: 5.0000 mg | ORAL_TABLET | Freq: Four times a day (QID) | ORAL | 0 refills | Status: DC | PRN

## 2020-08-09 MED ORDER — BUPIVACAINE HCL (PF) 0.5 % IJ SOLN
INTRAMUSCULAR | Status: DC | PRN
  Administered 2020-08-09: 5 mL

## 2020-08-09 MED ORDER — MIDAZOLAM HCL 2 MG/2ML IJ SOLN
INTRAMUSCULAR | Status: DC | PRN
  Administered 2020-08-09: 2 mg via INTRAVENOUS

## 2020-08-09 MED ORDER — LIDOCAINE-EPINEPHRINE 1 %-1:100000 IJ SOLN
INTRAMUSCULAR | Status: AC
  Filled 2020-08-09: qty 1

## 2020-08-09 MED ORDER — POLYMYXIN B-TRIMETHOPRIM 10000-0.1 UNIT/ML-% OP SOLN
OPHTHALMIC | Status: AC
  Administered 2020-08-09: 1 [drp] via OPHTHALMIC
  Filled 2020-08-09: qty 10

## 2020-08-09 MED ORDER — LIDOCAINE HCL (PF) 2 % IJ SOLN
INTRAMUSCULAR | Status: AC
  Filled 2020-08-09: qty 5

## 2020-08-09 MED ORDER — FENTANYL CITRATE (PF) 100 MCG/2ML IJ SOLN
INTRAMUSCULAR | Status: DC | PRN
  Administered 2020-08-09 (×2): 25 ug via INTRAVENOUS
  Administered 2020-08-09: 50 ug via INTRAVENOUS

## 2020-08-09 MED ORDER — GLYCOPYRROLATE 0.2 MG/ML IJ SOLN
INTRAMUSCULAR | Status: AC
  Filled 2020-08-09: qty 1

## 2020-08-09 MED ORDER — FENTANYL CITRATE (PF) 100 MCG/2ML IJ SOLN
INTRAMUSCULAR | Status: AC
  Filled 2020-08-09: qty 2

## 2020-08-09 MED ORDER — SUCCINYLCHOLINE CHLORIDE 20 MG/ML IJ SOLN
INTRAMUSCULAR | Status: DC | PRN
  Administered 2020-08-09: 100 mg via INTRAVENOUS

## 2020-08-09 MED ORDER — PHENYLEPHRINE HCL (PRESSORS) 10 MG/ML IV SOLN
INTRAVENOUS | Status: DC | PRN
  Administered 2020-08-09: 100 ug via INTRAVENOUS
  Administered 2020-08-09: 200 ug via INTRAVENOUS
  Administered 2020-08-09: 100 ug via INTRAVENOUS
  Administered 2020-08-09: 200 ug via INTRAVENOUS

## 2020-08-09 MED ORDER — DEXAMETHASONE SODIUM PHOSPHATE 10 MG/ML IJ SOLN
INTRAMUSCULAR | Status: AC
  Filled 2020-08-09: qty 1

## 2020-08-09 MED ORDER — LIDOCAINE HCL (CARDIAC) PF 100 MG/5ML IV SOSY
PREFILLED_SYRINGE | INTRAVENOUS | Status: DC | PRN
  Administered 2020-08-09: 80 mg via INTRAVENOUS

## 2020-08-09 MED ORDER — FAMOTIDINE 20 MG PO TABS
ORAL_TABLET | ORAL | Status: AC
  Administered 2020-08-09: 20 mg via ORAL
  Filled 2020-08-09: qty 1

## 2020-08-09 MED ORDER — ISOSULFAN BLUE 1 % ~~LOC~~ SOLN
SUBCUTANEOUS | Status: AC
  Filled 2020-08-09: qty 5

## 2020-08-09 MED ORDER — CHLORHEXIDINE GLUCONATE 0.12 % MT SOLN
OROMUCOSAL | Status: AC
  Administered 2020-08-09: 15 mL via OROMUCOSAL
  Filled 2020-08-09: qty 15

## 2020-08-09 MED ORDER — EPHEDRINE 5 MG/ML INJ
INTRAVENOUS | Status: AC
  Filled 2020-08-09: qty 10

## 2020-08-09 SURGICAL SUPPLY — 51 items
ADH SKN CLS APL DERMABOND .7 (GAUZE/BANDAGES/DRESSINGS) ×2
APL PRP STRL LF DISP 70% ISPRP (MISCELLANEOUS) ×1
BINDER BREAST LRG (GAUZE/BANDAGES/DRESSINGS) ×2 IMPLANT
BINDER BREAST MEDIUM (GAUZE/BANDAGES/DRESSINGS) IMPLANT
BINDER BREAST XLRG (GAUZE/BANDAGES/DRESSINGS) IMPLANT
BINDER BREAST XXLRG (GAUZE/BANDAGES/DRESSINGS) IMPLANT
BLADE SURG 15 STRL LF DISP TIS (BLADE) ×2 IMPLANT
BLADE SURG 15 STRL SS (BLADE) ×4
CANISTER SUCT 1200ML W/VALVE (MISCELLANEOUS) ×2 IMPLANT
CHLORAPREP W/TINT 26 (MISCELLANEOUS) ×2 IMPLANT
CLIP VESOCCLUDE SM WIDE 6/CT (CLIP) ×2 IMPLANT
CNTNR SPEC 2.5X3XGRAD LEK (MISCELLANEOUS)
CONT SPEC 4OZ STER OR WHT (MISCELLANEOUS)
CONT SPEC 4OZ STRL OR WHT (MISCELLANEOUS)
CONTAINER SPEC 2.5X3XGRAD LEK (MISCELLANEOUS) IMPLANT
COVER WAND RF STERILE (DRAPES) ×2 IMPLANT
DERMABOND ADVANCED (GAUZE/BANDAGES/DRESSINGS) ×2
DERMABOND ADVANCED .7 DNX12 (GAUZE/BANDAGES/DRESSINGS) ×2 IMPLANT
DEVICE DSSCT PLSMBLD 3.0S LGHT (MISCELLANEOUS) ×1 IMPLANT
DEVICE DUBIN SPECIMEN MAMMOGRA (MISCELLANEOUS) ×2 IMPLANT
DRAPE LAPAROTOMY 77X122 PED (DRAPES) ×2 IMPLANT
DRSG GAUZE FLUFF 36X18 (GAUZE/BANDAGES/DRESSINGS) ×2 IMPLANT
ELECT CAUTERY BLADE TIP 2.5 (TIP) ×2
ELECT REM PT RETURN 9FT ADLT (ELECTROSURGICAL) ×2
ELECTRODE CAUTERY BLDE TIP 2.5 (TIP) ×1 IMPLANT
ELECTRODE REM PT RTRN 9FT ADLT (ELECTROSURGICAL) ×1 IMPLANT
GLOVE SURG ENC MOIS LTX SZ6.5 (GLOVE) ×8 IMPLANT
GLOVE SURG UNDER LTX SZ7 (GLOVE) ×8 IMPLANT
GOWN STRL REUS W/ TWL LRG LVL3 (GOWN DISPOSABLE) ×4 IMPLANT
GOWN STRL REUS W/TWL LRG LVL3 (GOWN DISPOSABLE) ×8
KIT MARKER MARGIN INK (KITS) ×2 IMPLANT
KIT TURNOVER KIT A (KITS) ×2 IMPLANT
LABEL OR SOLS (LABEL) ×2 IMPLANT
MANIFOLD NEPTUNE II (INSTRUMENTS) ×2 IMPLANT
MARKER MARGIN CORRECT CLIP (MARKER) IMPLANT
NEEDLE HYPO 25X1 1.5 SAFETY (NEEDLE) ×2 IMPLANT
PACK BASIN MINOR ARMC (MISCELLANEOUS) ×2 IMPLANT
PLASMABLADE 3.0S W/LIGHT (MISCELLANEOUS) ×2
SET LOCALIZER 20 PROBE US (MISCELLANEOUS) ×2 IMPLANT
SLEVE PROBE SENORX GAMMA FIND (MISCELLANEOUS) ×2 IMPLANT
STRIP CLOSURE SKIN 1/2X4 (GAUZE/BANDAGES/DRESSINGS) ×4 IMPLANT
SUT MNCRL 4-0 (SUTURE) ×2
SUT MNCRL 4-0 27XMFL (SUTURE) ×1
SUT SILK 2 0 SH (SUTURE) IMPLANT
SUT VIC AB 3-0 SH 27 (SUTURE) ×2
SUT VIC AB 3-0 SH 27X BRD (SUTURE) ×1 IMPLANT
SUTURE MNCRL 4-0 27XMF (SUTURE) ×1 IMPLANT
SYR 10ML LL (SYRINGE) ×2 IMPLANT
SYR BULB IRRIG 60ML STRL (SYRINGE) ×2 IMPLANT
TUBING CONNECTING 10 (TUBING) ×2 IMPLANT
WATER STERILE IRR 1000ML POUR (IV SOLUTION) ×2 IMPLANT

## 2020-08-09 NOTE — Op Note (Signed)
Sentinel Node Biopsy Synoptic Operative Report  Operation performed with curative intent:No  Tracer(s) used to identify sentinel nodes in the upfront surgery (non-neoadjuvant) setting (select all that apply):Dye and Radioactive Tracer  Tracer(s) used to identify sentinel nodes in the neoadjuvant setting (select all that apply):N/A  All nodes (colored or non-colored) present at the end of a dye-filled lymphatic channel were removed:Yes   All significantly radioactive nodes were removed:Yes  All palpable suspicious nodes were removed:N/A  Biopsy-proven positive nodes marked with clips prior to chemotherapy were identified and removed:N/A   Operative Note  Preoperative Diagnosis: 1.  Left breast invasive carcinoma; 2.  Left breast intraductal papilloma  Postoperative Diagnosis: Same, pathology pending  Operation: Radiofrequency guided excision of left breast intraductal papilloma; radiofrequency guided left breast lumpectomy; injection of blue dye; sentinel lymph node biopsy; adjacent tissue transfer 3 x 3 cm  Surgeon: [name], MD  Assistant: None  Anesthesia: General endotracheal  Findings: Sentinel node findings as follows: Sentinel node #1: hot and blue, count 9722 Sentinel node #2: hot and blue, count 13,533 Sentinel node #3: hot, not blue, count 1804 Sentinel node #4: hot and blue, count 3295  Both the RF ID chip and clip were appreciated in each of the excisional specimens.  Pathology did not see any obvious tumor in the lumpectomy specimen.  Indications: This is a 50 year old woman who had a left breast intraductal papilloma discovered after an abnormality was seen on routine mammogram.  She elected to pursue conservative management.  However, on follow-up 1-monthmammogram, a mass was appreciated in the left breast.  This was biopsied and was found to represent invasive mammary carcinoma, ER/PR positive, HER-2 negative.  It was recommended that she undergo left  breast radiofrequency guided lumpectomy along with sentinel lymph node biopsy, as well as excision of the left breast intraductal papilloma.  The risks of the procedures were discussed with her in detail and she agreed to proceed.  Procedure In Detail: The patient was identified in the preoperative holding area where the surgical site was appropriately marked.  She had already undergone injection of radiotracer in nuclear medicine.  She was brought to the operating room and placed supine on the OR table.  All bony prominences were padded and bilateral sequential compression devices were placed on the lower extremities.  General endotracheal anesthesia was induced without incident.  The patient was positioned appropriately for the operation.  5 mL of isosulfan blue was injected in a periareolar location, just below the skin.  This was massaged into the tissues for 5 minutes.  The patient was then sterilely prepped and draped in standard fashion.  A timeout was performed confirming her identity, the procedure being performed, her allergies, all necessary equipment was available, and that maintenance anesthesia was adequate.  The node seeker was utilized to identify the areas of highest radioactivity in the axilla.  The skin along this site was infiltrated with a one-to-one mixture of 0.25% bupivacaine and 1% lidocaine with epinephrine.  An incision was made overlying the area of highest radioactivity.  This was carried down through the skin and subcutaneous tissues using electrocautery.  A self-retaining retractor was placed.  We dissected through the fatty tissues until bright blue lymphatic channels were identified.  Each of these was followed to its terminus and multiple lymph nodes were excised, as described under findings.  There were 2 additional nonsentinel nodes excised as well.  Once the counts in the axilla dropped to below 10% of our highest node,  the wound was irrigated and hemostasis confirmed.  The  wound was closed in layers, using 3-0 Vicryl to close some of the potential space, followed by a 3-0 Vicryl deep dermal layer.  The skin was closed with running subcuticular Monocryl.  We then returned to the intraductal papilloma.  Using the RF ID finder, the chip was localized.  The skin surrounding a periareolar position was injected with 0.5% plain bupivacaine.  An incision was made around the lower outer quadrant of the areola.  This was carried down through the subcutaneum using electrocautery.  Skin hooks were used to distract the skin.  Using the RF ID finder as a guide, the intraductal papilloma was excised.  Intra-Op imaging confirmed that both the biopsy clip and RF ID tag were included.  This specimen was then sent to pathology.  The wound bed was irrigated and hemostasis confirmed.  This was closed in 2 layers using deep dermal 3-0 Vicryl and running subcuticular Monocryl.  We then approached the breast cancer.  The RF ID finder was used to localize the chip.  The skin overlying the area was infiltrated with a one-to-one mixture of 0.25% bupivacaine and 1% lidocaine with epinephrine.  A curvilinear incision was made overlying the chip.  This was carried down through the skin and subcutaneous tissues using electrocautery.  Skin hooks were used to distract the skin.  A massive tissue was circumferentially dissected away until our RF ID chip finder confirmed that the tip was within the specimen.  I took an additional posterior margin as I could see the tip of the biopsy clip at the margin of the initial specimen.  The main specimen was inked for orientation and intraoperative imaging with the Faxitron performed.  This confirmed that the RF ID chip and biopsy clip were contained within the specimen.  The additional posterior margin was not oriented or marked.  Both of these were sent to pathology to confirm negative margins.  While we awaited these results, the wound bed was irrigated and good  hemostasis was achieved.  The biopsy cavity was marked with ligaclips.  I then mobilized approximately 9 cm of breast tissue and tacked it in place to fill the defect.  The wound was then closed in 2 layers with a deep dermal 3-0 Vicryl and a running subcuticular Monocryl.  Pathology called back stating that they did not appreciate any tumor in the margins.  Each of the incisions was sealed with Dermabond and covered with Steri-Strips.  Gauze fluffs and a breast binder were applied as a final dressing.  The patient was awakened, extubated, and taken to the postanesthesia care unit in good condition.  EBL: Less than 5 cc  IVF: See anesthesia record  Specimen(s): Sentinel lymph node #1, sentinel lymph node #2, sentinel lymph node #3, sentinel lymph node #4, nonsentinel node #1, nonsentinel node #2, left breast intraductal papilloma, left breast lumpectomy and additional posterior margin  Complications: none immediately apparent.   Counts: all needles, instruments, and sponges were counted and reported to be correct in number at the end of the case.   I was present for and participated in the entire operation.  [name] 4:09 PM

## 2020-08-09 NOTE — Transfer of Care (Signed)
Immediate Anesthesia Transfer of Care Note  Patient: Beth [name]  Procedure(s) Performed: BREAST [place] SENTINEL LYMPH NODE BIOPSY (Left ) EXCISION DUCTAL SYSTEM BREAST, left intraductal papilloma (Left )  Patient Location: PACU  Anesthesia Type:General  Level of Consciousness: drowsy  Airway & Oxygen Therapy: Patient Spontanous Breathing and Patient connected to face mask oxygen  Post-op Assessment: Report given to RN and Post -op Vital signs reviewed and stable  Post vital signs: Reviewed and stable  Last Vitals:  Vitals Value Taken Time  BP 138/67 08/09/20 1545  Temp 36.7 C 08/09/20 1544  Pulse 93 08/09/20 1548  Resp 15 08/09/20 1548  SpO2 98 % 08/09/20 1548  Vitals shown include unvalidated device data.  Last Pain:  Vitals:   08/09/20 1126  TempSrc: Oral  PainSc: 0-No pain         Complications: No complications documented.

## 2020-08-09 NOTE — Anesthesia Procedure Notes (Signed)
Procedure Name: Intubation Date/Time: 08/09/2020 12:30 PM Performed by: Tollie Eth, CRNA Pre-anesthesia Checklist: Patient identified, Patient being monitored, Timeout performed, Emergency Drugs available and Suction available Patient Re-evaluated:Patient Re-evaluated prior to induction Oxygen Delivery Method: Circle system utilized Preoxygenation: Pre-oxygenation with 100% oxygen Induction Type: IV induction Ventilation: Mask ventilation without difficulty Laryngoscope Size: 3 and McGraph Grade View: Grade I Tube type: Oral Tube size: 7.0 mm Number of attempts: 1 Airway Equipment and Method: Stylet Placement Confirmation: ETT inserted through vocal cords under direct vision,  positive ETCO2 and breath sounds checked- equal and bilateral Secured at: 21 cm Tube secured with: Tape Dental Injury: Teeth and Oropharynx as per pre-operative assessment

## 2020-08-09 NOTE — Anesthesia Postprocedure Evaluation (Signed)
Anesthesia Post Note  Patient: Beth [name]  Procedure(s) Performed: BREAST LUMPECTOMY,RADIO FREQ LOCALIZER,AXILLARY SENTINEL LYMPH NODE BIOPSY (Left ) EXCISION DUCTAL SYSTEM BREAST, left intraductal papilloma (Left )  Patient location during evaluation: PACU Anesthesia Type: General Level of consciousness: awake and alert Pain management: pain level controlled Vital Signs Assessment: post-procedure vital signs reviewed and stable Respiratory status: spontaneous breathing, nonlabored ventilation, respiratory function stable and patient connected to nasal cannula oxygen Cardiovascular status: blood pressure returned to baseline and stable Postop Assessment: no apparent nausea or vomiting Anesthetic complications: no   No complications documented.   Last Vitals:  Vitals:   08/09/20 1815 08/09/20 1836  BP: 130/78 116/66  Pulse: 100 (!) 105  Resp: 17 16  Temp:  37.1 C  SpO2: 94% 99%    Last Pain:  Vitals:   08/09/20 1836  TempSrc:   PainSc: 1                  Shyler Clan

## 2020-08-09 NOTE — Discharge Instructions (Signed)
AMBULATORY SURGERY  DISCHARGE INSTRUCTIONS   1) The drugs that you were given will stay in your system until tomorrow so for the next 24 hours you should not:  A) Drive an automobile B) Make any legal decisions C) Drink any alcoholic beverage   2) You may resume regular meals tomorrow.  Today it is better to start with liquids and gradually work up to solid foods.  You may eat anything you prefer, but it is better to start with liquids, then soup and crackers, and gradually work up to solid foods.   3) Please notify your doctor immediately if you have any unusual bleeding, trouble breathing, redness and pain at the surgery site, drainage, fever, or pain not relieved by medication.    4) Additional Instructions:        Please contact your physician with any problems or Same Day Surgery at [phone], Monday through Friday 6 am to 4 pm, or [place] [facility] number at [phone].Marland Kitchen  Please notify MD if left eye pain worsens or continues after 24 hours.

## 2020-08-09 NOTE — Interval H&P Note (Signed)
History and Physical Interval Note:  08/09/2020 12:09 PM  Beth [name]  has presented today for surgery, with the diagnosis of left breast cancer, left breast intraductal papilloma.  The various methods of treatment have been discussed with the patient and family. After consideration of risks, benefits and other options for treatment, the patient has consented to  Procedure(s): [place] (Left) EXCISION DUCTAL SYSTEM BREAST, left intraductal papilloma (Left) as a surgical intervention.  The patient's history has been reviewed, patient examined, no change in status, stable for surgery.  I have reviewed the patient's chart and labs.  Questions were answered to the patient's satisfaction.     [name]

## 2020-08-09 NOTE — Anesthesia Preprocedure Evaluation (Signed)
Anesthesia Evaluation  Patient identified by MRN, date of birth, ID band Patient awake    Reviewed: Allergy & Precautions, H&P , NPO status , Patient's Chart, lab work & pertinent test results  History of Anesthesia Complications (+) PONV and history of anesthetic complications  Airway Mallampati: III  TM Distance: <3 FB Neck ROM: full    Dental no notable dental hx.    Pulmonary neg pulmonary ROS,    Pulmonary exam normal breath sounds clear to auscultation       Cardiovascular negative cardio ROS Normal cardiovascular exam Rhythm:regular Rate:Normal     Neuro/Psych negative neurological ROS  negative psych ROS   GI/Hepatic negative GI ROS, Neg liver ROS,   Endo/Other  Hyperthyroidism   Renal/GU negative Renal ROS  negative genitourinary   Musculoskeletal negative musculoskeletal ROS (+)   Abdominal   Peds negative pediatric ROS (+)  Hematology negative hematology ROS (+)   Anesthesia Other Findings Past Medical History: No date: Cardiac disease     Comment:  d/t family history of cardiac issues No date: Diffuse cystic mastopathy No date: Family history of malignant neoplasm of breast No date: Graves disease     Comment:  In remission No date: History of kidney stones     Comment:  history of stone in past No date: Hyperlipidemia No date: Hyperthyroidism     Comment:  remission No date: PONV (postoperative nausea and vomiting)  Reproductive/Obstetrics                             Anesthesia Physical  Anesthesia Plan  ASA: II  Anesthesia Plan: General   Post-op Pain Management:    Induction: Intravenous  PONV Risk Score and Plan: 4 or greater and Treatment may vary due to age or medical condition  Airway Management Planned: Oral ETT  Additional Equipment:   Intra-op Plan:   Post-operative Plan: Extubation in OR  Informed Consent: I have reviewed the patients  History and Physical, chart, labs and discussed the procedure including the risks, benefits and alternatives for the proposed anesthesia with the patient or authorized representative who has indicated his/her understanding and acceptance.     Dental Advisory Given  Plan Discussed with: CRNA  Anesthesia Plan Comments:         Anesthesia Quick Evaluation  

## 2020-08-10 ENCOUNTER — Encounter: Payer: Self-pay | Admitting: General Surgery

## 2020-08-12 ENCOUNTER — Encounter: Payer: Self-pay | Admitting: General Surgery

## 2020-08-12 ENCOUNTER — Other Ambulatory Visit: Payer: Self-pay | Admitting: Anatomic Pathology & Clinical Pathology

## 2020-08-12 LAB — SURGICAL PATHOLOGY

## 2020-08-12 NOTE — Progress Notes (Signed)
t

## 2020-08-13 ENCOUNTER — Telehealth: Payer: Self-pay | Admitting: General Surgery

## 2020-08-13 NOTE — Telephone Encounter (Signed)
Spoke w/pt and husband and discussed pathology, need for re-excision and ALND. Patient to be discussed at tumor board next week. Will discuss further at clinic follow up.

## 2020-08-16 DIAGNOSIS — C50919 Malignant neoplasm of unspecified site of unspecified female breast: Secondary | ICD-10-CM

## 2020-08-16 DIAGNOSIS — Z803 Family history of malignant neoplasm of breast: Secondary | ICD-10-CM

## 2020-08-16 NOTE — Progress Notes (Signed)
Patient has post op visit with Dr. Celine Ahr on 08/19/20.  Per Dr. Grayland Ormond , Genetics referral put in.  Notified Faith Rogue.

## 2020-08-19 ENCOUNTER — Other Ambulatory Visit: Payer: Self-pay

## 2020-08-19 ENCOUNTER — Ambulatory Visit (INDEPENDENT_AMBULATORY_CARE_PROVIDER_SITE_OTHER): Payer: Managed Care, Other (non HMO) | Admitting: General Surgery

## 2020-08-19 ENCOUNTER — Telehealth: Payer: Self-pay | Admitting: General Surgery

## 2020-08-19 ENCOUNTER — Encounter: Payer: Self-pay | Admitting: General Surgery

## 2020-08-19 VITALS — BP 150/107 | HR 93 | Temp 99.0°F | Ht 64.0 in | Wt 184.6 lb

## 2020-08-19 DIAGNOSIS — C50912 Malignant neoplasm of unspecified site of left female breast: Secondary | ICD-10-CM

## 2020-08-19 NOTE — Patient Instructions (Addendum)
You can place neosporin onto irritated areas. You may return to showering normally. You can return to exercising but ease your way back in while improving range of motion. Keep weight limits under 10 lbs. You can return to work after 08/24/2020.  Please contact us if you have any questions or concerns.    Shoulder Range of Motion Exercises Shoulder range of motion (ROM) exercises are done to keep the shoulder moving freely or to increase movement. They are often recommended for people who have shoulder pain or stiffness or who are recovering from a shoulder surgery. Phase 1 exercises When you are able, do this exercise 1-2 times per day for 30-60 seconds in each direction, or as directed by your health care provider. Pendulum exercise To do this exercise while sitting: 1. Sit in a chair or at the edge of your bed with your feet flat on the floor. 2. Let your affected arm hang down in front of you over the edge of the bed or chair. 3. Relax your shoulder, arm, and hand. Perry your body so your arm gently swings in small circles. You can also use your unaffected arm to start the motion. 5. Repeat changing the direction of the circles, swinging your arm left and right, and swinging your arm forward and back. To do this exercise while standing: 1. Stand next to a sturdy chair or table, and hold on to it with your hand on your unaffected side. 2. Bend forward at the waist. 3. Bend your knees slightly. 4. Relax your shoulder, arm, and hand. 5. While keeping your shoulder relaxed, use body motion to swing your arm in small circles. 6. Repeat changing the direction of the circles, swinging your arm left and right, and swinging your arm forward and back. 7. Between exercises, stand up tall and take a short break to relax your lower back.   Phase 2 exercises Do these exercises 1-2 times per day or as told by your health care provider. Hold each stretch for 30 seconds, and repeat 3 times. Do the  exercises with one or both arms as instructed by your health care provider. For these exercises, sit at a table with your hand and arm supported by the table. A chair that slides easily or has wheels can be helpful. External rotation 1. Turn your chair so that your affected side is nearest to the table. 2. Place your forearm on the table to your side. Bend your elbow about 90 at the elbow (right angle) and place your hand palm facing down on the table. Your elbow should be about 6 inches away from your side. 3. Keeping your arm on the table, lean your body forward. Abduction 1. Turn your chair so that your affected side is nearest to the table. 2. Place your forearm and hand on the table so that your thumb points toward the ceiling and your arm is straight out to your side. 3. Slide your hand out to the side and away from you, using your unaffected arm to do the work. 4. To increase the stretch, you can slide your chair away from the table. Flexion: forward stretch 1. Sit facing the table. Place your hand and elbow on the table in front of you. 2. Slide your hand forward and away from you, using your unaffected arm to do the work. 3. To increase the stretch, you can slide your chair backward. Phase 3 exercises Do these exercises 1-2 times per day or as told by  your health care provider. Hold each stretch for 30 seconds, and repeat 3 times. Do the exercises with one or both arms as instructed by your health care provider. Cross-body stretch: posterior capsule stretch 1. Lift your arm straight out in front of you. 2. Bend your arm 90 at the elbow (right angle) so your forearm moves across your body. 3. Use your other arm to gently pull the elbow across your body, toward your other shoulder. Wall climbs 1. Stand with your affected arm extended out to the side with your hand resting on a door frame. 2. Slide your hand slowly up the door frame. 3. To increase the stretch, step through the door  frame. Keep your body upright and do not lean. Wand exercises You will need a cane, a piece of PVC pipe, or a sturdy wooden dowel for wand exercises. Flexion To do this exercise while standing: 1. Hold the wand with both of your hands, palms down. 2. Using the other arm to help, lift your arms up and over your head, if able. 3. Push upward with your other arm to gently increase the stretch. To do this exercise while lying down: 1. Lie on your back with your elbows resting on the floor and the wand in both your hands. Your hands will be palm down, or pointing toward your feet. 2. Lift your hands toward the ceiling, using your unaffected arm to help if needed. 3. Bring your arms overhead as able, using your unaffected arm to help if needed. Internal rotation 1. Stand while holding the wand behind you with both hands. Your unaffected arm should be extended above your head with the arm of the affected side extended behind you at the level of your waist. The wand should be pointing straight up and down as you hold it. 2. Slowly pull the wand up behind your back by straightening the elbow of your unaffected arm and bending the elbow of your affected arm. External rotation 1. Lie on your back with your affected upper arm supported on a small pillow or rolled towel. When you first do this exercise, keep your upper arm close to your body. Over time, bring your arm up to a 90 angle out to the side. 2. Hold the wand across your stomach and with both hands palm up. Your elbow on your affected side should be bent at a 90 angle. 3. Use your unaffected side to help push your forearm away from you and toward the floor. Keep your elbow on your affected side bent at a 90 angle. Contact a health care provider if you have:  New or increasing pain.  New numbness, tingling, weakness, or discoloration in your arm or hand. This information is not intended to replace advice given to you by your health care  provider. Make sure you discuss any questions you have with your health care provider. Document Revised: 07/18/2017 Document Reviewed: 07/18/2017 Elsevier Patient Education  2021 [employer].

## 2020-08-19 NOTE — Progress Notes (Signed)
Beth [name] is here today for a postoperative visit.  She is a 50 year old woman who underwent left breast lumpectomy, excision of left breast intraductal papilloma, and sentinel lymph node biopsy on August 09, 2020.  She states that she has not had to take any narcotic pain medication, getting adequate relief from ibuprofen and Tylenol.  She denies any drainage from any of the surgical sites, but does state that the Steri-Strips have been rubbing at the axillary site.  She says that she had a low-grade fever (around 99 degrees) the Saturday after surgery, but by the next morning it had resolved and she has been afebrile since then.  She has been gradually increasing the range of motion in her left arm, as instructed.  Final pathology demonstrated:  SURGICAL PATHOLOGY SURGICAL PATHOLOGY  CASE: [id number]  PATIENT: ANNE [name]  Surgical Pathology Report      Specimen Submitted:  A. Breast, left, intraductal papilloma  B. Breast, left; additional posterior margin  C. Sentinel node 1, left axilla  D. Sentinel node 2, left axilla  E. Sentinel node 3, left axilla  F. Sentinel node 4, left axilla  G. Non Sentinel node 1, left axilla  H. Non Sentinel node 2, left axilla   Clinical History: Left breast cancer, left breast intraductal papilloma     DIAGNOSIS:  A. BREAST, LEFT; EXCISION:  - BENIGN INTRADUCTAL PAPILLOMA, WITH ASSOCIATED APOCRINE CHANGE, PRESENT  AT INKED AND CAUTERIZED, UNORIENTED MARGIN.  - CLIP AND POSSIBLE BIOPSY SITE IDENTIFIED.  - NEGATIVE FOR ATYPICAL PROLIFERATIVE BREAST DISEASE.   B. BREAST, LEFT, EXCISION WITH ADDITIONAL POSTERIOR MARGIN:  - INVASIVE MAMMARY CARCINOMA, WITH MIXED DUCTAL AND LOBULAR FEATURES.  - DUCTAL CARCINOMA IN SITU, INTERMEDIATE GRADE.  - ASSOCIATED LOBULAR NEOPLASIA.  - SEPARATE INTRADUCTAL PAPILLOMA.  - CLIP AND BIOPSY SITE IDENTIFIED.  - SEE COMMENT AND CANCER SUMMARY.   C. LYMPH NODE, LEFT AXILLARY SENTINEL #1; EXCISION:  - ONE LYMPH  NODE, POSITIVE FOR MACRO METASTATIC CARCINOMA, 5 MM IN  GREATEST LINEAR EXTENT (1/1).   D. LYMPH NODE, LEFT AXILLARY SENTINEL #2; EXCISION:  - ONE LYMPH NODE, POSITIVE FOR MACRO METASTATIC CARCINOMA, 12 MM IN  GREATEST LINEAR EXTENT (1/1).   E. LYMPH NODE, LEFT AXILLARY SENTINEL #3; EXCISION:  - ONE LYMPH NODE, POSITIVE FOR MACRO METASTATIC CARCINOMA, 5 MM IN  GREATEST LINEAR EXTENT (1/1).   F. LYMPH NODE, LEFT AXILLARY SENTINEL #4; EXCISION:  - ONE LYMPH NODE, NEGATIVE FOR MALIGNANCY (0/1).   G. LYMPH NODE, LEFT AXILLARY NONSENTINEL #1; EXCISION:  - ONE LYMPH NODE, NEGATIVE FOR MALIGNANCY (0/1).   H. LYMPH NODE, LEFT AXILLARY 9 SENTINEL #2; EXCISION:  - ONE LYMPH NODE, NEGATIVE FOR MALIGNANCY (0/1).   Comment:  Review of the specimen indicated as left breast lumpectomy and  additional posterior margin identifies clip present within the main  lumpectomy specimen. Microscopic review shows presence of focal  residual invasive mammary carcinoma, present at the posterior aspect of  this main lumpectomy specimen. The second tissue fragment present  within the tissue container, representing the additional posterior  margin, is received uninked and unoriented. Invasive carcinoma is  present at multiple cauterized edges of this small tissue fragment. As  such, the posterior margin is assumed positive for invasive carcinoma.  In addition, transected biopsy site is also identified in the additional  posterior fragment.   Initial review of sentinel lymph nodes shows multiple lymph nodes with  subtle minute areas of subcapsular metastatic carcinoma. Due to the  difficulty in separating  tumor cells from adjacent histiocytes,  immunohistochemical studies directed against pancytokeratin are  performed. These studies highlight metastatic carcinoma present in three  of the sampled sentinel lymph nodes (parts C, D, and E).   CANCER CASE SUMMARY: INVASIVE CARCINOMA OF THE BREAST   Standard(s): AJCC-UICC 8   SPECIMEN  Procedure: Excision  Specimen Laterality: Left   TUMOR  Histologic Type: Invasive carcinoma with mixed ductal and lobular  features  Histologic Grade (Nottingham Histologic Score)            Glandular (Acinar)/Tubular Differentiation: 2            Nuclear Pleomorphism: 2            Mitotic Rate: 1            Overall Grade: Grade 1  Tumor Size: 11 mm  Ductal Carcinoma In Situ (DCIS): Present, intermediate grade  Lymphovascular Invasion: Not identified  Treatment Effect in the Breast: No known presurgical therapy   MARGINS  Margin Status for Invasive Carcinoma: Margin involved by Invasive  Carcinoma            Posterior/deep, multifocal   Margin Status for DCIS: Margin(s) Involved by DCIS:            Posterior/deep, unifocal   REGIONAL LYMPH NODES  Regional Lymph Node Status: Tumor present in regional lymph node(s)            Number of Lymph Nodes with Macrometastases (greater  than 2 mm): 3            Number of Lymph Nodes with Micrometastases (greater  than 0.2 mm to 2 mm and/or greater than 200 cells): 0            Number of Lymph Nodes with Isolated Tumor Cells  (0.2 mm or less OR 200 cells or less): 0            Size of Largest Metastatic Deposit: 12 mm            Extranodal Extension: Not identified            Total Number of Lymph Nodes Examined (sentinel and  non-sentinel): 6            Number of Sentinel Nodes Examined: 4   DISTANT METASTASIS  Distant Site(s) Involved, if applicable: Not applicable   PATHOLOGIC STAGE CLASSIFICATION (pTNM, AJCC 8th Edition):  TNM Descriptors: Not applicable  DJ2E  Regional Lymph Nodes Modifier: Not applicable  QA8T  pM - Not applicable   SPECIAL STUDIES  Breast Biomarker Testing Performed on Previous Biopsy: [id number]  Estrogen Receptor (ER)  Status: POSITIVE      Percentage of cells with nuclear positivity: Greater than 90      Average intensity of staining: Moderate   Progesterone Receptor (PgR) Status: POSITIVE      Percentage of cells with nuclear positivity: 70-80      Average intensity of staining: Moderate   HER2 (by immunohistochemistry): NEGATIVE (Score 1+)     Today's Vitals   08/19/20 0903  BP: (!) 150/107  Pulse: 93  Temp: 99 F (37.2 C)  TempSrc: Oral  Weight: 184 lb 9.6 oz (83.7 kg)  Height: $Remove'5\' 4"'noKOOeX$  (1.626 m)   Body mass index is 31.69 kg/m. Focused examination of her surgical sites demonstrates that the Steri-Strips are still in place.  These were removed to reveal a well approximated incisions.  There is no erythema, induration, or drainage present from any of the sites.  The skin at the axilla does appear slightly macerated, but without significant breakdown.  Impression and plan: This is a 50 year old woman with ER/PR positive left breast cancer.  Unfortunately, 3 of 4 sentinel nodes were positive for invasive ductal carcinoma.  In addition, the posterior margin of the lumpectomy was positive as well.  After discussion with Dr. Grayland Ormond, her oncologist, we have agreed to proceed with reexcision lumpectomy and axillary node dissection.  We will work on getting this scheduled in the coming weeks.  In the interim, I have encouraged her to continue range of motion exercises.  I did caution her that the risk of lymphedema increases with the axillary node dissection and that she may require additional physical therapy as well as a compression garment, should this occur.  She will be meeting with Dr. Grayland Ormond and Dr. Baruch Gouty next week.  They will work with her on the next steps of her treatment.

## 2020-08-19 NOTE — Telephone Encounter (Signed)
Patient calls back, she is informed of all dates regarding her surgery and voices understanding.

## 2020-08-19 NOTE — Telephone Encounter (Signed)
Outgoing call is made, left message for patient to call.  Please advise patient of Pre-Admission date/time, COVID Testing date and Surgery date.  Surgery Date: 09/13/20 Preadmission Testing Date: 09/06/20 (phone 8a-1p) Covid Testing Date: 09/09/20 - patient advised to go to the [facility] between 8a-1p  Also patient to call at [phone], between 1-3:00pm the day before surgery, to find out what time to arrive for surgery.

## 2020-08-21 NOTE — Progress Notes (Unsigned)
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  INTERVAL HISTORY: Patient returns to clinic today postoperatively for further evaluation and additional treatment planning.  She tolerated procedure well, but was noted to have positive margins and 3 sentinel lymph nodes positive for disease. She currently feels well and is asymptomatic.  She has no neurologic complaints.  She denies any recent fevers or illnesses.  She has a good appetite and denies weight loss.  She has no chest pain, shortness of breath, cough, or hemoptysis.  She denies any nausea, vomiting, constipation, or diarrhea.  She has no urinary complaints.  Patient offers no specific complaints today.  REVIEW OF SYSTEMS:   Review of Systems  Constitutional: Negative.  Negative for fever, malaise/fatigue and weight loss.  Respiratory: Negative.  Negative for cough, hemoptysis and shortness of breath.   Cardiovascular: Negative.  Negative for chest pain and leg swelling.  Gastrointestinal: Negative.  Negative for abdominal pain.  Genitourinary: Negative.  Negative for dysuria.  Musculoskeletal: Negative.  Negative for back pain.  Skin: Negative.  Negative for rash.  Neurological: Negative.  Negative for dizziness, focal weakness and weakness.  Psychiatric/Behavioral: Negative.  The patient is not nervous/anxious.     As per HPI. Otherwise, a complete review of systems is negative.  PAST MEDICAL HISTORY: Past Medical History:  Diagnosis Date  . Cardiac  disease    d/t family history of cardiac issues  . Diffuse cystic mastopathy   . Family history of malignant neoplasm of breast   . Graves disease    In remission  . History of kidney stones    history of stone in past  . Hyperlipidemia   . Hyperthyroidism    remission  . PONV (postoperative nausea and vomiting)     PAST SURGICAL HISTORY: Past Surgical History:  Procedure Laterality Date  . BREAST BIOPSY Right 2008   benign  . BREAST BIOPSY Left 2/13    aspiration cyst  . BREAST BIOPSY Left 01/2020   Sclerosing intraductal papilloma with apocrine metaplasia. No atypia or malignancy.  Marland Kitchen BREAST BIOPSY Left 07/15/2020   [place] bx, heart marker, path pending  . BREAST DUCTAL SYSTEM EXCISION Left 08/09/2020   Procedure: EXCISION DUCTAL SYSTEM BREAST, left intraductal papilloma;  Surgeon: [name], MD;  Location: [facility];  Service: General;  Laterality: Left;  . BREAST LUMPECTOMY,RADIO FREQ LOCALIZER,AXILLARY SENTINEL LYMPH NODE BIOPSY Left 08/09/2020   Procedure: BREAST LUMPECTOMY,RADIO FREQ LOCALIZER,AXILLARY SENTINEL LYMPH NODE BIOPSY;  Surgeon: [name], MD;  Location: [facility];  Service: General;  Laterality: Left;  . COLONOSCOPY WITH PROPOFOL N/A 07/12/2020   Procedure: COLONOSCOPY WITH PROPOFOL;  Surgeon: Lucilla Lame, MD;  Location: [place];  Service: Endoscopy;  Laterality: N/A;  PRIORITY 4  . DILATION AND CURETTAGE OF UTERUS  2011  . eyelid surgery     . ORBITAL RECONSTRUCTION Left 2008   graves disease caused "bug eyed"  . POLYPECTOMY  07/12/2020   Procedure: POLYPECTOMY;  Surgeon: Lucilla Lame, MD;  Location: [place];  Service: Endoscopy;;  . WISDOM TOOTH EXTRACTION  FAMILY HISTORY: Family History  Problem Relation Age of Onset  . Diabetes Mother   . Hypertension Mother   . Hyperlipidemia Mother   . Hypertension Father   . Hyperlipidemia Father   . Hypothyroidism Father   . Prostate cancer Father   . Hyperlipidemia Sister    . Hypertension Sister   . Hyperlipidemia Sister   . Hypertension Sister   . Diabetes Sister   . Hyperlipidemia Sister   . Hypertension Sister   . Breast cancer Maternal Grandmother     ADVANCED DIRECTIVES (Y/N):  N  HEALTH MAINTENANCE: Social History   Tobacco Use  . Smoking status: Never Smoker  . Smokeless tobacco: Never Used  Vaping Use  . Vaping Use: Never used  Substance Use Topics  . Alcohol use: Yes    Comment: occasional (2x/yr)  . Drug use: No     Colonoscopy:  PAP:  Bone density:  Lipid panel:  Allergies  Allergen Reactions  . Other     Antibiotic drop used after surgery, pt unsure of name    Current Outpatient Medications  Medication Sig Dispense Refill  . acetaminophen (TYLENOL) 650 MG CR tablet Take 1,300 mg by mouth every 8 (eight) hours as needed for pain.    . Calcium Carbonate-Vitamin D (CALCIUM 600+D PO) Take 2 tablets by mouth daily.    Marland Kitchen CRANBERRY PO Take 2 tablets by mouth daily.    Marland Kitchen FIBER PO Take 1 each by mouth daily. Gummy    . fish oil-omega-3 fatty acids 1000 MG capsule Take 2 g by mouth daily.    . Garlic Oil 9244 MG CAPS Take 1,000 mg by mouth daily.    . Ibuprofen 200 MG CAPS Take 4 capsules (800 mg total) by mouth every 6 (six) hours as needed (pain, fever, headache). 120 capsule 0  . Ibuprofen-Diphenhydramine Cit (ADVIL PM PO) Take 1 tablet by mouth at bedtime as needed (sleep/insomnia).    . Multiple Vitamin (MULTIVITAMIN WITH MINERALS) TABS tablet Take 1 tablet by mouth daily. Women's One A Day    . vitamin B-12 (CYANOCOBALAMIN) 1000 MCG tablet Take 1,000 mcg by mouth daily.    Marland Kitchen White Petrolatum-Mineral Oil (GENTEAL TEARS NIGHT-TIME) OINT Apply 1 drop to eye at bedtime.    . hydroxypropyl methylcellulose / hypromellose (ISOPTO TEARS / GONIOVISC) 2.5 % ophthalmic solution Place 1 drop into both eyes daily.     No current facility-administered medications for this visit.    OBJECTIVE: Vitals:   08/24/20 1048  BP: (!) 156/87   Pulse: 93  Resp: 20  Temp: (!) 97 F (36.1 C)  SpO2: 100%     Body mass index is 30.38 kg/m.    ECOG FS:0 - Asymptomatic  General: Well-developed, well-nourished, no acute distress. Eyes: Pink conjunctiva, anicteric sclera. HEENT: Normocephalic, moist mucous membranes. Lungs: No audible wheezing or coughing. Heart: Regular rate and rhythm. Abdomen: Soft, nontender, no obvious distention. Musculoskeletal: No edema, cyanosis, or clubbing. Neuro: Alert, answering all questions appropriately. Cranial nerves grossly intact. Skin: No rashes or petechiae noted. Psych: Normal affect.  LAB RESULTS:  Lab Results  Component Value Date   NA 138 05/18/2020   K 4.3 05/18/2020   CL 101 05/18/2020   CO2 22 05/18/2020   GLUCOSE 100 (H) 05/18/2020   BUN 13 05/18/2020   CREATININE 0.55 (L) 05/18/2020   CALCIUM 9.4 05/18/2020   PROT 7.2 05/18/2020   ALBUMIN 4.8 05/18/2020   AST 14 05/18/2020   ALT 23 05/18/2020  ALKPHOS 124 (H) 05/18/2020   BILITOT 0.4 05/18/2020   GFRNONAA 110 05/18/2020   GFRAA 126 05/18/2020    Lab Results  Component Value Date   WBC 13.0 (H) 05/18/2020   NEUTROABS 9.0 (H) 05/18/2020   HGB 13.8 05/18/2020   HCT 41.7 05/18/2020   MCV 89 05/18/2020   PLT 389 05/18/2020     STUDIES: NM Sentinel Node Inj-No Rpt (Breast)  Result Date: 08/09/2020 Sulfur colloid was injected by the nuclear medicine technologist for melanoma sentinel node.   MM Breast Surgical Specimen  Addendum Date: 08/09/2020   ADDENDUM REPORT: 08/09/2020 14:41 ADDENDUM: Additional specimen radiograph is provided containing the heart shaped clip with associated radiofrequency tag. Electronically Signed   By: Franki Cabot M.D.   On: 08/09/2020 14:41   Result Date: 08/09/2020 CLINICAL DATA:  Status post surgical excision today after earlier radiofrequency tag localization. EXAM: SPECIMEN RADIOGRAPH OF THE LEFT BREAST COMPARISON:  Previous exam(s). FINDINGS: Status post excision of the LEFT  breast. The radiofrequency tag and ribbon shaped clip are present within the specimen. IMPRESSION: Specimen radiograph of the LEFT breast. Electronically Signed: By: Franki Cabot M.D. On: 08/09/2020 14:07   MM DIAG BREAST TOMO UNI LEFT  Result Date: 08/06/2020 CLINICAL DATA:  Post procedure mammogram to ensure appropriate RF tag placement. EXAM: DIAGNOSTIC LEFT MAMMOGRAM POST RF TAG PLACEMENT COMPARISON:  Previous exam(s). ACR Breast Density Category c: The breast tissue is heterogeneously dense, which may obscure small masses. FINDINGS: There is an RF tag adjacent to the biopsy clip and mass at 11:30 o'clock as well as an RF tag adjacent to the biopsy clip in the retroareolar left breast. IMPRESSION: Appropriate positioning of the two RF tags in the left breast. Electronically Signed   By: Audie Pinto M.D.   On: 08/06/2020 16:23   [place] LT RADIO FREQUENCY TAG LOC US GUIDE  Result Date: 08/06/2020 CLINICAL DATA:  50 year old female with newly diagnosed left breast invasive carcinoma as well as a left breast papilloma. Excision is planned for both. Patient is scheduled for lumpectomy on Monday 08/09/2020. EXAM: MAMMOGRAPHIC GUIDED RADIOFREQUENCY DEVICE LOCALIZATION OF THE LEFT BREAST x 2 COMPARISON:  Previous exam(s) FINDINGS: Patient presents for radiofrequency device localization prior to . I met with the patient and we discussed the procedure of radiofrequency device localization including benefits and alternatives. We discussed the high likelihood of a successful procedure. We discussed the risks of the procedure including infection, bleeding, tissue injury and further surgery. Informed, written consent was given. The usual time-out protocol was performed immediately prior to the procedure. 1. Using mammographic guidance, sterile technique, 1% lidocaine as local anesthesia, a radiofrequency tag ([id number]) was used to localize the mass and heart shaped clip in the upper inner left breast using a medial  approach. 2. Using mammographic guidance, sterile technique, 1% lidocaine as local anesthesia, a radiofrequency tag ([id number]) was used to localize ribbon shaped clip in the retroareolar left breast using a lateral approach. The follow-up mammogram images confirm that the RF devices are in the expected location and are marked. The patient tolerated the procedure well and was released in good condition IMPRESSION: Radiofrequency device localization of the LEFT breast. No apparent complications. Electronically Signed   By: Audie Pinto M.D.   On: 08/06/2020 16:27   [place] LT RADIO FREQUENCY TAG EA ADD LESION LOC US GUIDE  Result Date: 08/06/2020 CLINICAL DATA:  50 year old female with newly diagnosed left breast invasive carcinoma as well as a left breast papilloma. Excision is planned  for both. Patient is scheduled for lumpectomy on Monday 08/09/2020. EXAM: MAMMOGRAPHIC GUIDED RADIOFREQUENCY DEVICE LOCALIZATION OF THE LEFT BREAST x 2 COMPARISON:  Previous exam(s) FINDINGS: Patient presents for radiofrequency device localization prior to . I met with the patient and we discussed the procedure of radiofrequency device localization including benefits and alternatives. We discussed the high likelihood of a successful procedure. We discussed the risks of the procedure including infection, bleeding, tissue injury and further surgery. Informed, written consent was given. The usual time-out protocol was performed immediately prior to the procedure. 1. Using mammographic guidance, sterile technique, 1% lidocaine as local anesthesia, a radiofrequency tag ([id number]) was used to localize the mass and heart shaped clip in the upper inner left breast using a medial approach. 2. Using mammographic guidance, sterile technique, 1% lidocaine as local anesthesia, a radiofrequency tag ([id number]) was used to localize ribbon shaped clip in the retroareolar left breast using a lateral approach. The follow-up mammogram images confirm that the  RF devices are in the expected location and are marked. The patient tolerated the procedure well and was released in good condition IMPRESSION: Radiofrequency device localization of the LEFT breast. No apparent complications. Electronically Signed   By: Audie Pinto M.D.   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   PLAN:    1. 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  She will require reexcision of her breast mass.  After lengthy discussion with surgery and radiation oncology, agreed upon the full axillary dissection is not necessary but she may need additional axillary sampling.  Her surgery is scheduled for September 13, 2020.  Although given the extent of disease, patient likely will require adjuvant chemotherapy but will send Oncotype for further evaluation.  Patient also benefit from an aromatase inhibitor for a minimum of 5 years at the completion of all her treatments.  Return to clinic 1 to 2 weeks after surgery to discuss her final pathology results and treatment planning.  I have also ordered Oncotype patient will return to clinic 2.  Genetic testing: Patient has been given a referral to genetic counseling.  I spent a total of 30 minutes reviewing chart data, face-to-face evaluation with the patient, counseling and coordination of care as detailed above.   Patient expressed understanding and was in agreement with this plan. She also understands that She can call clinic at any time with any questions, concerns, or complaints.   Cancer Staging 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, left [facility]) Staging form: Breast, AJCC 8th Edition - Pathologic  stage from 08/25/2020: Stage IA (pT1c, pN1a, cM0, G1, ER+, PR+, HER2-) - Signed by Lloyd Huger, MD on 08/25/2020 Stage prefix: Initial diagnosis Histologic grading system: 3 grade system   Lloyd Huger, MD   08/25/2020 5:48 AM

## 2020-08-24 ENCOUNTER — Encounter: Payer: Self-pay | Admitting: Radiation Oncology

## 2020-08-24 ENCOUNTER — Other Ambulatory Visit: Payer: Self-pay

## 2020-08-24 ENCOUNTER — Inpatient Hospital Stay: Payer: Managed Care, Other (non HMO) | Attending: Oncology | Admitting: Oncology

## 2020-08-24 ENCOUNTER — Ambulatory Visit
Admission: RE | Admit: 2020-08-24 | Discharge: 2020-08-24 | Disposition: A | Discharge status: Home / Self Care | Payer: Managed Care, Other (non HMO) | Source: Ambulatory Visit | Attending: Radiation Oncology | Admitting: Radiation Oncology

## 2020-08-24 VITALS — BP 156/87 | HR 93 | Temp 97.0°F | Resp 20 | Wt 177.0 lb

## 2020-08-24 VITALS — BP 156/87 | HR 93 | Temp 97.0°F | Resp 16 | Wt 185.6 lb

## 2020-08-24 DIAGNOSIS — Z79899 Other long term (current) drug therapy: Secondary | ICD-10-CM | POA: Diagnosis not present

## 2020-08-24 DIAGNOSIS — C50212 Malignant neoplasm of upper-inner quadrant of left female breast: Secondary | ICD-10-CM

## 2020-08-24 DIAGNOSIS — Z803 Family history of malignant neoplasm of breast: Secondary | ICD-10-CM | POA: Insufficient documentation

## 2020-08-24 DIAGNOSIS — Z8349 Family history of other endocrine, nutritional and metabolic diseases: Secondary | ICD-10-CM | POA: Insufficient documentation

## 2020-08-24 DIAGNOSIS — Z87442 Personal history of urinary calculi: Secondary | ICD-10-CM | POA: Insufficient documentation

## 2020-08-24 DIAGNOSIS — Z79811 Long term (current) use of aromatase inhibitors: Secondary | ICD-10-CM | POA: Insufficient documentation

## 2020-08-24 DIAGNOSIS — Z8042 Family history of malignant neoplasm of prostate: Secondary | ICD-10-CM | POA: Diagnosis not present

## 2020-08-24 DIAGNOSIS — Z833 Family history of diabetes mellitus: Secondary | ICD-10-CM | POA: Insufficient documentation

## 2020-08-24 DIAGNOSIS — Z8249 Family history of ischemic heart disease and other diseases of the circulatory system: Secondary | ICD-10-CM | POA: Diagnosis not present

## 2020-08-24 DIAGNOSIS — Z170 Estrogen receptor positive status [ER+]: Secondary | ICD-10-CM | POA: Insufficient documentation

## 2020-08-24 NOTE — Consult Note (Signed)
NEW PATIENT EVALUATION  Name: Beth [name]  MRN: [id number]  Date:   08/24/2020     DOB: 1971-06-15   This 50 y.o. female patient presents to the clinic for initial evaluation of stage IIa (T1 cN1 aM0).  Invasive mammary carcinoma of the left breast status post wide local excision and lymph node dissection.  Tumor is invasive carcinoma with mixed ductal and lobular features ER/PR positive HER-2/neu not overexpressed  REFERRING PHYSICIAN: Bacigalupo, Dionne Bucy, MD  CHIEF COMPLAINT:  Chief Complaint  Patient presents with  . Breast Cancer    Initial consultation     DIAGNOSIS: The encounter diagnosis was 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, left ([place]).   PREVIOUS INVESTIGATIONS:  Pathology reviewed Mammogram and ultrasound reviewed Clinical notes reviewed  HPI: Patient is a 50 year old female who presented with an abnormal mammogram of her left breast.  There was a suspicious mass in the left breast 11:30 position 4 cm from the nipple measuring 8 x 11 x 13 mm causing architectural distortion warranting biopsy.  She underwent ultrasound-guided biopsy which was positive for invasive mammary carcinoma ER/PR positive HER-2/neu not overexpressed.  Tumor had mixed ductal and lobular features and there was adjacent lobular carcinoma in situ.  She went on to have a wide local excision again showing invasive carcinoma with mixed ductal and lobular features measuring 1.1 cm.  Lymph vascular invasion was not present.  Posterior and deep margins were multifocally involved prompting need for repeat wide local excision.  3 sentinel lymph nodes were all positive for macro metastatic carcinoma in 1 lymph node measuring up to 1.2 cm.  1 other sentinel lymph node and 2 other lymph nodes were negative for metastatic disease.  She tolerated her surgery well and is seen today for radiation oncology opinion.  She is scheduled for reexcision at the end of the month.  She specifically denies breast  tenderness cough or bone pain.  There was another area sampled of the left breast which was an intraductal papilloma  PLANNED TREATMENT REGIMEN: Systemic chemotherapy followed by left breast and peripheral lymphatic radiation  PAST MEDICAL HISTORY:  has a past medical history of Cardiac disease, Diffuse cystic mastopathy, Family history of malignant neoplasm of breast, Graves disease, History of kidney stones, Hyperlipidemia, Hyperthyroidism, and PONV (postoperative nausea and vomiting).    PAST SURGICAL HISTORY:  Past Surgical History:  Procedure Laterality Date  . BREAST BIOPSY Right 2008   benign  . BREAST BIOPSY Left 2/13    aspiration cyst  . BREAST BIOPSY Left 01/2020   Sclerosing intraductal papilloma with apocrine metaplasia. No atypia or malignancy.  Marland Kitchen BREAST BIOPSY Left 07/15/2020   [place] bx, heart marker, path pending  . BREAST DUCTAL SYSTEM EXCISION Left 08/09/2020   Procedure: EXCISION DUCTAL SYSTEM BREAST, left intraductal papilloma;  Surgeon: [name], MD;  Location: [facility];  Service: General;  Laterality: Left;  . BREAST LUMPECTOMY,RADIO FREQ LOCALIZER,AXILLARY SENTINEL LYMPH NODE BIOPSY Left 08/09/2020   Procedure: BREAST LUMPECTOMY,RADIO FREQ LOCALIZER,AXILLARY SENTINEL LYMPH NODE BIOPSY;  Surgeon: [name], MD;  Location: [facility];  Service: General;  Laterality: Left;  . COLONOSCOPY WITH PROPOFOL N/A 07/12/2020   Procedure: COLONOSCOPY WITH PROPOFOL;  Surgeon: Lucilla Lame, MD;  Location: [place];  Service: Endoscopy;  Laterality: N/A;  PRIORITY 4  . DILATION AND CURETTAGE OF UTERUS  2011  . eyelid surgery     . ORBITAL RECONSTRUCTION Left 2008   graves disease caused "bug eyed"  . POLYPECTOMY  07/12/2020  Procedure: POLYPECTOMY;  Surgeon: Lucilla Lame, MD;  Location: [facility];  Service: Endoscopy;;  . WISDOM TOOTH EXTRACTION      FAMILY HISTORY: family history includes Breast cancer in her maternal grandmother; Diabetes in her  mother and sister; Hyperlipidemia in her father, mother, sister, sister, and sister; Hypertension in her father, mother, sister, sister, and sister; Hypothyroidism in her father; Prostate cancer in her father.  SOCIAL HISTORY:  reports that she has never smoked. She has never used smokeless tobacco. She reports current alcohol use. She reports that she does not use drugs.  ALLERGIES: Patient has no known allergies.  MEDICATIONS:  Current Outpatient Medications  Medication Sig Dispense Refill  . acetaminophen (TYLENOL) 650 MG CR tablet Take 1,300 mg by mouth every 8 (eight) hours as needed for pain.    . Calcium Carbonate-Vitamin D (CALCIUM 600+D PO) Take 2 tablets by mouth daily.    Marland Kitchen CRANBERRY PO Take 2 tablets by mouth daily.    Marland Kitchen FIBER PO Take 1 each by mouth daily. Gummy    . fish oil-omega-3 fatty acids 1000 MG capsule Take 2 g by mouth daily.    . Garlic Oil 7209 MG CAPS Take 1,000 mg by mouth daily.    . Ibuprofen 200 MG CAPS Take 4 capsules (800 mg total) by mouth every 6 (six) hours as needed (pain, fever, headache). 120 capsule 0  . Ibuprofen-Diphenhydramine Cit (ADVIL PM PO) Take 1 tablet by mouth at bedtime as needed (sleep/insomnia).    . Multiple Vitamin (MULTIVITAMIN WITH MINERALS) TABS tablet Take 1 tablet by mouth daily. Women's One A Day    . vitamin B-12 (CYANOCOBALAMIN) 1000 MCG tablet Take 1,000 mcg by mouth daily.    Marland Kitchen White Petrolatum-Mineral Oil (GENTEAL TEARS NIGHT-TIME) OINT Apply 1 drop to eye at bedtime.    . hydroxypropyl methylcellulose / hypromellose (ISOPTO TEARS / GONIOVISC) 2.5 % ophthalmic solution Place 1 drop into both eyes daily. (Patient not taking: No sig reported)     No current facility-administered medications for this encounter.    ECOG PERFORMANCE STATUS:  0 - Asymptomatic  REVIEW OF SYSTEMS: Patient denies any weight loss, fatigue, weakness, fever, chills or night sweats. Patient denies any loss of vision, blurred vision. Patient denies any  ringing  of the ears or hearing loss. No irregular heartbeat. Patient denies heart murmur or history of fainting. Patient denies any chest pain or pain radiating to her upper extremities. Patient denies any shortness of breath, difficulty breathing at night, cough or hemoptysis. Patient denies any swelling in the lower legs. Patient denies any nausea vomiting, vomiting of blood, or coffee ground material in the vomitus. Patient denies any stomach pain. Patient states has had normal bowel movements no significant constipation or diarrhea. Patient denies any dysuria, hematuria or significant nocturia. Patient denies any problems walking, swelling in the joints or loss of balance. Patient denies any skin changes, loss of hair or loss of weight. Patient denies any excessive worrying or anxiety or significant depression. Patient denies any problems with insomnia. Patient denies excessive thirst, polyuria, polydipsia. Patient denies any swollen glands, patient denies easy bruising or easy bleeding. Patient denies any recent infections, allergies or URI. Patient "s visual fields have not changed significantly in recent time.   PHYSICAL EXAM: BP (!) 156/87 (BP Location: Right Arm, Patient Position: Sitting)   Pulse 93   Temp (!) 97 F (36.1 C) (Tympanic)   Resp 16   Wt 185 lb 9.6 oz (84.2 kg)  LMP 07/26/2020 (Exact Date)   BMI 31.86 kg/m  She has 2 areas of incision in the left breast both are healing well.  No dominant masses noted in either breast.  No axillary or supraclavicular adenopathy is identified.  Well-developed well-nourished patient in NAD. HEENT reveals PERLA, EOMI, discs not visualized.  Oral cavity is clear. No oral mucosal lesions are identified. Neck is clear without evidence of cervical or supraclavicular adenopathy. Lungs are clear to A&P. Cardiac examination is essentially unremarkable with regular rate and rhythm without murmur rub or thrill. Abdomen is benign with no organomegaly or  masses noted. Motor sensory and DTR levels are equal and symmetric in the upper and lower extremities. Cranial nerves II through XII are grossly intact. Proprioception is intact. No peripheral adenopathy or edema is identified. No motor or sensory levels are noted. Crude visual fields are within normal range.  LABORATORY DATA: Pathology report reviewed    RADIOLOGY RESULTS: Mammogram ultrasound reviewed compatible with above-stated findings   IMPRESSION: Stage T1c N1AM0 invasive mammary carcinoma with both lobular and ductal features status post wide local excision with positive margins and positive L axillary lymph nodes in 50 year old female tumor is ER/PR positive HER-2/neu not overexpressed  PLAN: This time patient will have reexcision.  I have recommended whole breast and peripheral lymphatic radiation after completion of chemotherapy.  I would not recommend further axillary lymph node dissection since this will certainly increase chances of lymphedema in her left upper extremity and based on meta-analysis would not buy any survival or disease-free survival advantage.  I have reviewed this all with both medical oncology and surgeon and she will only have a reexcision at the end of this month.  I will see the patient back for follow-up after completion of chemotherapy.  Patient husband both seem to comprehend my recommendations well.  I would like to take this opportunity to thank you for allowing me to participate in the care of your patient.Noreene Filbert, MD

## 2020-08-24 NOTE — Progress Notes (Signed)
Patient had initial Rad/Onc visit today.  Also met with Dr. Grayland Ormond to discuss additional surgery scheduled for 09/13/20.  Met with patient and husband in lobby to assess navigation needs.

## 2020-08-30 ENCOUNTER — Encounter: Payer: Self-pay | Admitting: Oncology

## 2020-09-06 ENCOUNTER — Other Ambulatory Visit: Payer: Self-pay

## 2020-09-06 ENCOUNTER — Other Ambulatory Visit
Admission: RE | Admit: 2020-09-06 | Discharge: 2020-09-06 | Disposition: A | Discharge status: Home / Self Care | Payer: Managed Care, Other (non HMO) | Source: Ambulatory Visit | Attending: General Surgery | Admitting: General Surgery

## 2020-09-06 ENCOUNTER — Other Ambulatory Visit: Payer: Self-pay | Admitting: General Surgery

## 2020-09-06 DIAGNOSIS — C50912 Malignant neoplasm of unspecified site of left female breast: Secondary | ICD-10-CM

## 2020-09-06 HISTORY — DX: Anxiety disorder, unspecified: F41.9

## 2020-09-06 HISTORY — DX: Depression, unspecified: F32.A

## 2020-09-06 NOTE — Patient Instructions (Addendum)
Your procedure is scheduled on: Monday September 13, 2020. Report to Day Surgery inside [facility] 2nd floor (stop by Admissions desk first before getting on Elevator) To find out your arrival time please call [phone] between 1PM - 3PM on Friday September 10, 2020.  Remember: Instructions that are not followed completely may result in serious medical risk,  up to and including death, or upon the discretion of your surgeon and anesthesiologist your  surgery may need to be rescheduled.     _X__ 1. Do not eat food after midnight the night before your procedure.                 No chewing gum or hard candies. You may drink clear liquids up to 2 hours                 before you are scheduled to arrive for your surgery- DO not drink clear                 liquids within 2 hours of the start of your surgery.                 Clear Liquids include:  water, apple juice without pulp, clear Gatorade, G2 or                  Gatorade Zero (avoid Red/Purple/Blue), Black Coffee or Tea (Do not add                 anything to coffee or tea).  __X__2.  On the morning of surgery brush your teeth with toothpaste and water, you                may rinse your mouth with mouthwash if you wish.  Do not swallow any toothpaste of mouthwash.     _X__ 3.  No Alcohol for 24 hours before or after surgery.   _X__ 4.  Do Not Smoke or use e-cigarettes For 24 Hours Prior to Your Surgery.                 Do not use any chewable tobacco products for at least 6 hours prior to                 Surgery.  _X__  5.  Do not use any recreational drugs (marijuana, cocaine, heroin, ecstasy, MDMA or other)                For at least one week prior to your surgery.  Combination of these drugs with anesthesia                May have life threatening results.  __X__6.  Notify your doctor if there is any change in your medical condition      (cold, fever, infections).     Do not wear jewelry, make-up, hairpins,  clips or nail polish. Do not wear lotions, powders, or perfumes.  Do not shave 48 hours prior to surgery. Men may shave face and neck. Do not bring valuables to the hospital.    [facility] is not responsible for any belongings or valuables.  Contacts, dentures or bridgework may not be worn into surgery. Leave your suitcase in the car. After surgery it may be brought to your room. For patients admitted to the hospital, discharge time is determined by your treatment team.   Patients discharged the day of surgery will not be allowed to drive home.   Make  arrangements for someone to be with you for the first 24 hours of your Same Day Discharge.   __X__ Take these medicines the morning of surgery:    1. hydroxypropyl methylcellulose / hypromellose (ISOPTO TEARS / GONIOVISC) 2.5 % ophthalmic solution  2.   3.   4.  5.  6.  ____ Fleet Enema (as directed)   ____ Use CHG Soap (or wipes) as directed  ____ Use Benzoyl Peroxide Gel as instructed  ____ Use inhalers on the day of surgery  ____ Stop metformin 2 days prior to surgery    ____ Take 1/2 of usual insulin dose the night before surgery. No insulin the morning          of surgery.   __X__ Stop Anti-inflammatories such as Ibuprofen, Aleve, Advil, naproxen, aspirin and or BC powders.   __X__ Stop all supplements until after surgery.  Ok to continue for B-12  __X__ Do not start any herbal supplements before your procedure.     If you have any questions regarding your pre-procedure instructions,  Please call Pre-admit Testing at [phone].

## 2020-09-08 ENCOUNTER — Encounter: Payer: Self-pay | Admitting: Licensed Clinical Social Worker

## 2020-09-08 ENCOUNTER — Inpatient Hospital Stay: Payer: Managed Care, Other (non HMO)

## 2020-09-08 ENCOUNTER — Inpatient Hospital Stay: Payer: Managed Care, Other (non HMO) | Admitting: Licensed Clinical Social Worker

## 2020-09-08 DIAGNOSIS — Z8042 Family history of malignant neoplasm of prostate: Secondary | ICD-10-CM

## 2020-09-08 DIAGNOSIS — C50212 Malignant neoplasm of upper-inner quadrant of left female breast: Secondary | ICD-10-CM

## 2020-09-08 DIAGNOSIS — Z803 Family history of malignant neoplasm of breast: Secondary | ICD-10-CM

## 2020-09-08 NOTE — Progress Notes (Signed)
REFERRING PROVIDER: Lloyd Huger, MD [address] [place],  [place] [address]  PRIMARY PROVIDER:  Virginia Crews, MD  PRIMARY REASON FOR VISIT:  1. 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 Family history of prostate cancer   3. Family history of breast cancer      HISTORY OF PRESENT ILLNESS:   Beth [name], a 50 y.o. female, was seen for a [place] cancer genetics consultation at the request of Dr. Grayland Ormond due to a personal and family history of cancer.  Beth [name] presents to clinic today to discuss the possibility of a hereditary predisposition to cancer, genetic testing, and to further clarify her future cancer risks, as well as potential cancer risks for family members.   In 2022, at the age of 86, Beth [name] was diagnosed with right breast cancer, ER/PR+, Her2-. She had lumpectomy on 2/21 and will have re-excision on 3/28. The rest of the treatment plan is still being determined.   CANCER HISTORY:  Oncology History  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, left ([place])  07/23/2020 Initial Diagnosis   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, left ([place])   08/25/2020 Cancer Staging   Staging form: Breast, AJCC 8th Edition - Pathologic stage from 08/25/2020: Stage IA (pT1c, pN1a, cM0, G1, ER+, PR+, HER2-) - Signed by Lloyd Huger, MD on 08/25/2020 Stage prefix: Initial diagnosis Histologic grading system: 3 grade system      RISK FACTORS:  Menarche was at age 21.  Ovaries intact: yes.  Hysterectomy: no.  Menopausal status: premenopausal.  HRT use: 0 years. Colonoscopy: yes; normal. Mammogram within the last year: yes. Number of breast biopsies: 3.   Past Medical History:  Diagnosis Date  . Anxiety    due to Covid   . Cardiac disease    d/t family history of cardiac issues  . Depression    due to Covid   . Diffuse cystic mastopathy   . Family history of breast cancer   . Family history of malignant  neoplasm of breast   . Family history of prostate cancer   . Graves disease    In remission  . History of kidney stones    history of stone in past  . Hyperlipidemia   . Hyperthyroidism    remission  . PONV (postoperative nausea and vomiting)     Past Surgical History:  Procedure Laterality Date  . BREAST BIOPSY Right 2008   benign  . BREAST BIOPSY Left 2/13    aspiration cyst  . BREAST BIOPSY Left 01/2020   Sclerosing intraductal papilloma with apocrine metaplasia. No atypia or malignancy.  Marland Kitchen BREAST BIOPSY Left 07/15/2020   [place] bx, heart marker, path pending  . BREAST DUCTAL SYSTEM EXCISION Left 08/09/2020   Procedure: EXCISION DUCTAL SYSTEM BREAST, left intraductal papilloma;  Surgeon: [name], MD;  Location: [facility];  Service: General;  Laterality: Left;  . BREAST LUMPECTOMY,RADIO FREQ LOCALIZER,AXILLARY SENTINEL LYMPH NODE BIOPSY Left 08/09/2020   Procedure: BREAST LUMPECTOMY,RADIO FREQ LOCALIZER,AXILLARY SENTINEL LYMPH NODE BIOPSY;  Surgeon: [name], MD;  Location: [facility];  Service: General;  Laterality: Left;  . COLONOSCOPY WITH PROPOFOL N/A 07/12/2020   Procedure: COLONOSCOPY WITH PROPOFOL;  Surgeon: Lucilla Lame, MD;  Location: [place];  Service: Endoscopy;  Laterality: N/A;  PRIORITY 4  . DILATION AND CURETTAGE OF UTERUS  2011  . eyelid surgery     . ORBITAL RECONSTRUCTION Left 2008   graves disease caused "  bug eyed"  . POLYPECTOMY  07/12/2020   Procedure: POLYPECTOMY;  Surgeon: Lucilla Lame, MD;  Location: [place];  Service: Endoscopy;;  . WISDOM TOOTH EXTRACTION      Social History   Socioeconomic History  . Marital status: Married    Spouse name: Event organiser  . Number of children: Not on file  . Years of education: Not on file  . Highest education level: Not on file  Occupational History  . Occupation: word processing    Comment: full time  Tobacco Use  . Smoking status: Never Smoker  . Smokeless tobacco: Never Used   Vaping Use  . Vaping Use: Never used  Substance and Sexual Activity  . Alcohol use: Yes    Comment: very rarely (maybe once a year)  . Drug use: No  . Sexual activity: Yes    Birth control/protection: None  Other Topics Concern  . Not on file  Social History Narrative   Patient lives with husband.    She feels safe in her phone.   Social Determinants of Health   Financial Resource Strain: Not on file  Food Insecurity: Not on file  Transportation Needs: Not on file  Physical Activity: Not on file  Stress: Not on file  Social Connections: Not on file     FAMILY HISTORY:  We obtained a detailed, 4-generation family history.  Significant diagnoses are listed below: Family History  Problem Relation Age of Onset  . Diabetes Mother   . Hypertension Mother   . Hyperlipidemia Mother   . Hypertension Father   . Hyperlipidemia Father   . Hypothyroidism Father   . Prostate cancer Father 27  . Hyperlipidemia Sister   . Hypertension Sister   . Hyperlipidemia Sister   . Hypertension Sister   . Diabetes Sister   . Hyperlipidemia Sister   . Hypertension Sister   . Breast cancer Maternal Grandmother 87   Beth [name] has 3 sisters and 1 brother, none have had cancer.   Beth [name] mother is living at 25, no history of cancer. Maternal grandmother had breast cancer at 78 and died at 67. No other known cancers on this side of the family.  Beth [name] father had prostate cancer at 29 and is living at 57. No other known cancers on this side of the family.  Beth [name] is unaware of previous family history of genetic testing for hereditary cancer risks. Patient's maternal ancestors are of [place] descent, and paternal ancestors are of [place] descent. There is no reported Ashkenazi Jewish ancestry. There is no known consanguinity.    GENETIC COUNSELING ASSESSMENT: Beth [name] is a 50 y.o. female with a personal and family history of breast cancer which is somewhat suggestive of a hereditary  cancer syndrome and predisposition to cancer. We, therefore, discussed and recommended the following at today's visit.   DISCUSSION: We discussed that approximately 5-10% of breast cancer is hereditary  Most cases of hereditary breast cancer are associated with BRCA1/BRCA2 genes, although there are other genes associated with hereditary breast and prostate cancer as well.  We discussed that testing is beneficial for several reasons including knowing about other cancer risks, identifying potential screening and risk-reduction options that may be appropriate, and to understand if other family members could be at risk for cancer and allow them to undergo genetic testing.   We reviewed the characteristics, features and inheritance patterns of hereditary cancer syndromes. We also discussed genetic testing, including the appropriate family members to test, the process  of testing, insurance coverage and turn-around-time for results. We discussed the implications of a negative, positive and/or variant of uncertain significant result. We recommended Beth [name] pursue genetic testing for the [employer] CancerNext-Expanded+RNA gene panel.   The CancerNext-Expanded + RNAinsight gene panel offered by [employer] and includes sequencing and rearrangement analysis for the following 77 genes: IP, ALK, APC*, ATM*, AXIN2, BAP1, BARD1, BLM, BMPR1A, BRCA1*, BRCA2*, BRIP1*, CDC73, CDH1*,CDK4, CDKN1B, CDKN2A, CHEK2*, CTNNA1, DICER1, FANCC, FH, FLCN, GALNT12, KIF1B, LZTR1, MAX, MEN1, MET, MLH1*, MSH2*, MSH3, MSH6*, MUTYH*, NBN, NF1*, NF2, NTHL1, PALB2*, PHOX2B, PMS2*, POT1, PRKAR1A, PTCH1, PTEN*, RAD51C*, RAD51D*,RB1, RECQL, RET, SDHA, SDHAF2, SDHB, SDHC, SDHD, SMAD4, SMARCA4, SMARCB1, SMARCE1, STK11, SUFU, TMEM127, TP53*,TSC1, TSC2, VHL and XRCC2 (sequencing and deletion/duplication); EGFR, EGLN1, HOXB13, KIT, MITF, PDGFRA, POLD1 and POLE (sequencing only); EPCAM and GREM1 (deletion/duplication only).  Based on Beth [name]'s personal  and  family history of cancer, she meets medical criteria for genetic testing. Despite that she meets criteria, she may still have an out of pocket cost. We discussed that if her out of pocket cost for testing is over $100, the laboratory will call and confirm whether she wants to proceed with testing.  If the out of pocket cost of testing is less than $100 she will be billed by the genetic testing laboratory.   PLAN: After considering the risks, benefits, and limitations, Beth [name] provided informed consent to pursue genetic testing and the blood sample was sent to [facility] for analysis of the CancerNext-Expanded+RNA. Results should be available within approximately 2-3 weeks' time, at which point they will be disclosed by telephone to Beth [name], as will any additional recommendations warranted by these results. Beth [name] will receive a summary of her genetic counseling visit and a copy of her results once available. This information will also be available in Epic.   Beth [name] questions were answered to her satisfaction today. Our contact information was provided should additional questions or concerns arise. Thank you for the referral and allowing [place] to share in the care of your patient.   Faith Rogue, MS, [facility] Genetic Counselor [email or web address] Phone: [phone]  The patient was seen for a total of 30 minutes in face-to-face genetic counseling. Patient's husband Nicki Reaper was also present. Dr. Grayland Ormond was available for discussion regarding this case.   _______________________________________________________________________ For Office Staff:  Number of people involved in session: 2 Was an Intern/ student involved with case: no

## 2020-09-09 ENCOUNTER — Other Ambulatory Visit
Admission: RE | Admit: 2020-09-09 | Discharge: 2020-09-09 | Disposition: A | Discharge status: Home / Self Care | Payer: Managed Care, Other (non HMO) | Source: Ambulatory Visit | Attending: General Surgery | Admitting: General Surgery

## 2020-09-09 ENCOUNTER — Other Ambulatory Visit: Payer: Self-pay

## 2020-09-09 DIAGNOSIS — Z20822 Contact with and (suspected) exposure to covid-19: Secondary | ICD-10-CM | POA: Diagnosis not present

## 2020-09-09 DIAGNOSIS — Z01812 Encounter for preprocedural laboratory examination: Secondary | ICD-10-CM | POA: Diagnosis not present

## 2020-09-09 LAB — SARS CORONAVIRUS 2 (TAT 6-24 HRS): SARS Coronavirus 2: NEGATIVE

## 2020-09-13 ENCOUNTER — Ambulatory Visit: Payer: Managed Care, Other (non HMO) | Admitting: Anesthesiology

## 2020-09-13 ENCOUNTER — Encounter
Admission: RE | Disposition: A | Discharge status: Home / Self Care | Payer: Self-pay | Source: Home / Self Care | Attending: General Surgery

## 2020-09-13 ENCOUNTER — Encounter: Payer: Self-pay | Admitting: General Surgery

## 2020-09-13 ENCOUNTER — Ambulatory Visit
Admission: RE | Admit: 2020-09-13 | Discharge: 2020-09-13 | Disposition: A | Discharge status: Home / Self Care | Payer: Managed Care, Other (non HMO) | Attending: General Surgery | Admitting: General Surgery

## 2020-09-13 DIAGNOSIS — Z170 Estrogen receptor positive status [ER+]: Secondary | ICD-10-CM | POA: Diagnosis not present

## 2020-09-13 DIAGNOSIS — C50912 Malignant neoplasm of unspecified site of left female breast: Secondary | ICD-10-CM | POA: Diagnosis not present

## 2020-09-13 DIAGNOSIS — C50812 Malignant neoplasm of overlapping sites of left female breast: Secondary | ICD-10-CM

## 2020-09-13 DIAGNOSIS — C773 Secondary and unspecified malignant neoplasm of axilla and upper limb lymph nodes: Secondary | ICD-10-CM

## 2020-09-13 DIAGNOSIS — D242 Benign neoplasm of left breast: Secondary | ICD-10-CM | POA: Insufficient documentation

## 2020-09-13 HISTORY — PX: BREAST LUMPECTOMY: SHX2

## 2020-09-13 LAB — POCT PREGNANCY, URINE: Preg Test, Ur: NEGATIVE

## 2020-09-13 SURGERY — BREAST LUMPECTOMY
Anesthesia: General | Laterality: Left

## 2020-09-13 MED ORDER — SUCCINYLCHOLINE CHLORIDE 20 MG/ML IJ SOLN
INTRAMUSCULAR | Status: DC | PRN
  Administered 2020-09-13: 100 mg via INTRAVENOUS

## 2020-09-13 MED ORDER — PROPOFOL 10 MG/ML IV BOLUS
INTRAVENOUS | Status: DC | PRN
  Administered 2020-09-13: 150 mg via INTRAVENOUS

## 2020-09-13 MED ORDER — SUGAMMADEX SODIUM 500 MG/5ML IV SOLN
INTRAVENOUS | Status: AC
  Filled 2020-09-13: qty 5

## 2020-09-13 MED ORDER — ORAL CARE MOUTH RINSE: 15.0000 mL | Freq: Once | OROMUCOSAL | Status: AC

## 2020-09-13 MED ORDER — FENTANYL CITRATE (PF) 100 MCG/2ML IJ SOLN
INTRAMUSCULAR | Status: AC
  Filled 2020-09-13: qty 2

## 2020-09-13 MED ORDER — ONDANSETRON HCL 4 MG/2ML IJ SOLN
INTRAMUSCULAR | Status: DC | PRN
  Administered 2020-09-13 (×2): 4 mg via INTRAVENOUS

## 2020-09-13 MED ORDER — FENTANYL CITRATE (PF) 100 MCG/2ML IJ SOLN
INTRAMUSCULAR | Status: DC | PRN
  Administered 2020-09-13 (×2): 50 ug via INTRAVENOUS

## 2020-09-13 MED ORDER — OXYCODONE HCL 5 MG PO TABS: 5.0000 mg | ORAL_TABLET | Freq: Four times a day (QID) | ORAL | 0 refills | Status: DC | PRN

## 2020-09-13 MED ORDER — GABAPENTIN 300 MG PO CAPS: 300.0000 mg | ORAL_CAPSULE | ORAL | Status: AC

## 2020-09-13 MED ORDER — ACETAMINOPHEN 500 MG PO TABS
ORAL_TABLET | ORAL | Status: AC
  Administered 2020-09-13: 1000 mg via ORAL
  Filled 2020-09-13: qty 2

## 2020-09-13 MED ORDER — SCOPOLAMINE 1 MG/3DAYS TD PT72
MEDICATED_PATCH | TRANSDERMAL | Status: AC
  Administered 2020-09-13: 1.5 mg via TRANSDERMAL
  Filled 2020-09-13: qty 1

## 2020-09-13 MED ORDER — FAMOTIDINE 20 MG PO TABS: 20.0000 mg | ORAL_TABLET | Freq: Once | ORAL | Status: AC

## 2020-09-13 MED ORDER — CHLORHEXIDINE GLUCONATE 0.12 % MT SOLN
OROMUCOSAL | Status: AC
  Administered 2020-09-13: 15 mL via OROMUCOSAL
  Filled 2020-09-13: qty 15

## 2020-09-13 MED ORDER — PHENYLEPHRINE HCL (PRESSORS) 10 MG/ML IV SOLN
INTRAVENOUS | Status: AC
  Filled 2020-09-13: qty 1

## 2020-09-13 MED ORDER — FENTANYL CITRATE (PF) 100 MCG/2ML IJ SOLN: 25.0000 ug | INTRAMUSCULAR | Status: DC | PRN

## 2020-09-13 MED ORDER — LIDOCAINE HCL (PF) 2 % IJ SOLN
INTRAMUSCULAR | Status: AC
  Filled 2020-09-13: qty 5

## 2020-09-13 MED ORDER — CHLORHEXIDINE GLUCONATE CLOTH 2 % EX PADS: 6.0000 | MEDICATED_PAD | Freq: Once | CUTANEOUS | Status: DC

## 2020-09-13 MED ORDER — BUPIVACAINE HCL (PF) 0.25 % IJ SOLN
INTRAMUSCULAR | Status: AC
  Filled 2020-09-13: qty 30

## 2020-09-13 MED ORDER — PHENYLEPHRINE HCL (PRESSORS) 10 MG/ML IV SOLN
INTRAVENOUS | Status: DC | PRN
  Administered 2020-09-13: 100 ug via INTRAVENOUS

## 2020-09-13 MED ORDER — CHLORHEXIDINE GLUCONATE 0.12 % MT SOLN: 15.0000 mL | Freq: Once | OROMUCOSAL | Status: AC

## 2020-09-13 MED ORDER — LIDOCAINE-EPINEPHRINE 1 %-1:100000 IJ SOLN
INTRAMUSCULAR | Status: DC | PRN
  Administered 2020-09-13: 9 mL via INTRAMUSCULAR

## 2020-09-13 MED ORDER — MIDAZOLAM HCL 2 MG/2ML IJ SOLN
INTRAMUSCULAR | Status: AC
  Filled 2020-09-13: qty 2

## 2020-09-13 MED ORDER — LIDOCAINE-EPINEPHRINE 1 %-1:100000 IJ SOLN
INTRAMUSCULAR | Status: AC
  Filled 2020-09-13: qty 1

## 2020-09-13 MED ORDER — GABAPENTIN 300 MG PO CAPS
ORAL_CAPSULE | ORAL | Status: AC
  Administered 2020-09-13: 300 mg via ORAL
  Filled 2020-09-13: qty 1

## 2020-09-13 MED ORDER — CEFAZOLIN SODIUM-DEXTROSE 2-4 GM/100ML-% IV SOLN: 2.0000 g | INTRAVENOUS | Status: DC

## 2020-09-13 MED ORDER — ROCURONIUM BROMIDE 100 MG/10ML IV SOLN
INTRAVENOUS | Status: DC | PRN
  Administered 2020-09-13: 40 mg via INTRAVENOUS

## 2020-09-13 MED ORDER — GLYCOPYRROLATE 0.2 MG/ML IJ SOLN
INTRAMUSCULAR | Status: AC
  Filled 2020-09-13: qty 2

## 2020-09-13 MED ORDER — FAMOTIDINE 20 MG PO TABS
ORAL_TABLET | ORAL | Status: AC
  Administered 2020-09-13: 20 mg via ORAL
  Filled 2020-09-13: qty 1

## 2020-09-13 MED ORDER — LIDOCAINE HCL (CARDIAC) PF 100 MG/5ML IV SOSY
PREFILLED_SYRINGE | INTRAVENOUS | Status: DC | PRN
  Administered 2020-09-13: 100 mg via INTRAVENOUS

## 2020-09-13 MED ORDER — MIDAZOLAM HCL 2 MG/2ML IJ SOLN
INTRAMUSCULAR | Status: DC | PRN
  Administered 2020-09-13: 2 mg via INTRAVENOUS

## 2020-09-13 MED ORDER — EPHEDRINE 5 MG/ML INJ
INTRAVENOUS | Status: AC
  Filled 2020-09-13: qty 10

## 2020-09-13 MED ORDER — ONDANSETRON HCL 4 MG/2ML IJ SOLN: 4.0000 mg | Freq: Once | INTRAMUSCULAR | Status: DC | PRN

## 2020-09-13 MED ORDER — EPHEDRINE SULFATE 50 MG/ML IJ SOLN
INTRAMUSCULAR | Status: DC | PRN
  Administered 2020-09-13: 5 mg via INTRAVENOUS

## 2020-09-13 MED ORDER — SEVOFLURANE IN SOLN
RESPIRATORY_TRACT | Status: AC
  Filled 2020-09-13: qty 250

## 2020-09-13 MED ORDER — BUPIVACAINE LIPOSOME 1.3 % IJ SUSP: 20.0000 mL | Freq: Once | INTRAMUSCULAR | Status: DC

## 2020-09-13 MED ORDER — PROPOFOL 10 MG/ML IV BOLUS
INTRAVENOUS | Status: AC
  Filled 2020-09-13: qty 20

## 2020-09-13 MED ORDER — ROCURONIUM BROMIDE 10 MG/ML (PF) SYRINGE
PREFILLED_SYRINGE | INTRAVENOUS | Status: AC
  Filled 2020-09-13: qty 30

## 2020-09-13 MED ORDER — GLYCOPYRROLATE 0.2 MG/ML IJ SOLN
INTRAMUSCULAR | Status: DC | PRN
  Administered 2020-09-13: .2 mg via INTRAVENOUS

## 2020-09-13 MED ORDER — DEXMEDETOMIDINE (PRECEDEX) IN NS 20 MCG/5ML (4 MCG/ML) IV SYRINGE
PREFILLED_SYRINGE | INTRAVENOUS | Status: AC
  Filled 2020-09-13: qty 10

## 2020-09-13 MED ORDER — SUCCINYLCHOLINE CHLORIDE 200 MG/10ML IV SOSY
PREFILLED_SYRINGE | INTRAVENOUS | Status: AC
  Filled 2020-09-13: qty 20

## 2020-09-13 MED ORDER — ACETAMINOPHEN 500 MG PO TABS: 1000.0000 mg | ORAL_TABLET | ORAL | Status: AC

## 2020-09-13 MED ORDER — SCOPOLAMINE 1 MG/3DAYS TD PT72: 1.0000 | MEDICATED_PATCH | TRANSDERMAL | Status: DC

## 2020-09-13 MED ORDER — CELECOXIB 200 MG PO CAPS
ORAL_CAPSULE | ORAL | Status: AC
  Administered 2020-09-13: 200 mg via ORAL
  Filled 2020-09-13: qty 1

## 2020-09-13 MED ORDER — ONDANSETRON HCL 4 MG/2ML IJ SOLN
INTRAMUSCULAR | Status: AC
  Filled 2020-09-13: qty 8

## 2020-09-13 MED ORDER — CEFAZOLIN SODIUM-DEXTROSE 2-4 GM/100ML-% IV SOLN
2.0000 g | INTRAVENOUS | Status: AC
  Administered 2020-09-13: 2 g via INTRAVENOUS

## 2020-09-13 MED ORDER — ESMOLOL HCL 100 MG/10ML IV SOLN
INTRAVENOUS | Status: AC
  Filled 2020-09-13: qty 10

## 2020-09-13 MED ORDER — OXYCODONE HCL 5 MG PO TABS
ORAL_TABLET | ORAL | Status: AC
  Filled 2020-09-13: qty 1

## 2020-09-13 MED ORDER — CELECOXIB 200 MG PO CAPS: 200.0000 mg | ORAL_CAPSULE | ORAL | Status: AC

## 2020-09-13 MED ORDER — CEFAZOLIN SODIUM-DEXTROSE 2-4 GM/100ML-% IV SOLN
INTRAVENOUS | Status: AC
  Filled 2020-09-13: qty 100

## 2020-09-13 MED ORDER — OXYCODONE HCL 5 MG PO TABS
5.0000 mg | ORAL_TABLET | Freq: Once | ORAL | Status: AC
  Administered 2020-09-13: 5 mg via ORAL

## 2020-09-13 MED ORDER — LACTATED RINGERS IV SOLN: INTRAVENOUS | Status: DC

## 2020-09-13 MED ORDER — SUGAMMADEX SODIUM 200 MG/2ML IV SOLN
INTRAVENOUS | Status: DC | PRN
  Administered 2020-09-13: 200 mg via INTRAVENOUS

## 2020-09-13 MED ORDER — DEXAMETHASONE SODIUM PHOSPHATE 10 MG/ML IJ SOLN
INTRAMUSCULAR | Status: AC
  Filled 2020-09-13: qty 2

## 2020-09-13 MED ORDER — DEXAMETHASONE SODIUM PHOSPHATE 10 MG/ML IJ SOLN
INTRAMUSCULAR | Status: DC | PRN
  Administered 2020-09-13: 10 mg via INTRAVENOUS

## 2020-09-13 SURGICAL SUPPLY — 48 items
ADH SKN CLS APL DERMABOND .7 (GAUZE/BANDAGES/DRESSINGS) ×2
APL PRP STRL LF DISP 70% ISPRP (MISCELLANEOUS) ×1
BINDER BREAST LRG (GAUZE/BANDAGES/DRESSINGS) IMPLANT
BINDER BREAST MEDIUM (GAUZE/BANDAGES/DRESSINGS) IMPLANT
BINDER BREAST XLRG (GAUZE/BANDAGES/DRESSINGS) IMPLANT
BINDER BREAST XXLRG (GAUZE/BANDAGES/DRESSINGS) IMPLANT
BLADE SURG 15 STRL LF DISP TIS (BLADE) ×2 IMPLANT
BLADE SURG 15 STRL SS (BLADE) ×4
CANISTER SUCT 1200ML W/VALVE (MISCELLANEOUS) ×2 IMPLANT
CHLORAPREP W/TINT 26 (MISCELLANEOUS) ×2 IMPLANT
CLIP VESOCCLUDE SM WIDE 6/CT (CLIP) ×2 IMPLANT
CNTNR SPEC 2.5X3XGRAD LEK (MISCELLANEOUS) ×1
CONT SPEC 4OZ STER OR WHT (MISCELLANEOUS) ×1
CONT SPEC 4OZ STRL OR WHT (MISCELLANEOUS) ×1
CONTAINER SPEC 2.5X3XGRAD LEK (MISCELLANEOUS) ×1 IMPLANT
COVER WAND RF STERILE (DRAPES) ×2 IMPLANT
DERMABOND ADVANCED (GAUZE/BANDAGES/DRESSINGS) ×2
DERMABOND ADVANCED .7 DNX12 (GAUZE/BANDAGES/DRESSINGS) ×2 IMPLANT
DEVICE DSSCT PLSMBLD 3.0S LGHT (MISCELLANEOUS) IMPLANT
DRAPE LAPAROTOMY 77X122 PED (DRAPES) ×2 IMPLANT
DRSG GAUZE FLUFF 36X18 (GAUZE/BANDAGES/DRESSINGS) ×2 IMPLANT
ELECT CAUTERY BLADE TIP 2.5 (TIP) ×2
ELECT REM PT RETURN 9FT ADLT (ELECTROSURGICAL) ×2
ELECTRODE CAUTERY BLDE TIP 2.5 (TIP) ×1 IMPLANT
ELECTRODE REM PT RTRN 9FT ADLT (ELECTROSURGICAL) ×1 IMPLANT
GLOVE SURG ENC MOIS LTX SZ6.5 (GLOVE) ×4 IMPLANT
GLOVE SURG UNDER LTX SZ7 (GLOVE) ×4 IMPLANT
GOWN STRL REUS W/ TWL LRG LVL3 (GOWN DISPOSABLE) ×2 IMPLANT
GOWN STRL REUS W/TWL LRG LVL3 (GOWN DISPOSABLE) ×4
KIT MARKER MARGIN INK (KITS) IMPLANT
KIT TURNOVER KIT A (KITS) ×2 IMPLANT
LABEL OR SOLS (LABEL) ×2 IMPLANT
MANIFOLD NEPTUNE II (INSTRUMENTS) ×2 IMPLANT
MARKER MARGIN CORRECT CLIP (MARKER) IMPLANT
NEEDLE HYPO 25X1 1.5 SAFETY (NEEDLE) ×2 IMPLANT
PACK BASIN MINOR ARMC (MISCELLANEOUS) ×2 IMPLANT
PLASMABLADE 3.0S W/LIGHT (MISCELLANEOUS)
STRIP CLOSURE SKIN 1/2X4 (GAUZE/BANDAGES/DRESSINGS) ×4 IMPLANT
SUT MNCRL 4-0 (SUTURE) ×2
SUT MNCRL 4-0 27XMFL (SUTURE) ×1
SUT SILK 2 0 SH (SUTURE) ×2 IMPLANT
SUT VIC AB 3-0 SH 27 (SUTURE) ×2
SUT VIC AB 3-0 SH 27X BRD (SUTURE) ×1 IMPLANT
SUTURE MNCRL 4-0 27XMF (SUTURE) ×1 IMPLANT
SYR 10ML LL (SYRINGE) ×2 IMPLANT
SYR BULB IRRIG 60ML STRL (SYRINGE) ×2 IMPLANT
TUBING CONNECTING 10 (TUBING) IMPLANT
WATER STERILE IRR 1000ML POUR (IV SOLUTION) ×2 IMPLANT

## 2020-09-13 NOTE — Anesthesia Preprocedure Evaluation (Signed)
Anesthesia Evaluation  Patient identified by MRN, date of birth, ID band Patient awake    Reviewed: Allergy & Precautions, H&P , NPO status , Patient's Chart, lab work & pertinent test results  History of Anesthesia Complications (+) PONV and history of anesthetic complications  Airway Mallampati: III  TM Distance: <3 FB Neck ROM: full    Dental no notable dental hx.    Pulmonary neg pulmonary ROS,    Pulmonary exam normal breath sounds clear to auscultation       Cardiovascular negative cardio ROS Normal cardiovascular exam Rhythm:regular Rate:Normal     Neuro/Psych negative neurological ROS  negative psych ROS   GI/Hepatic negative GI ROS, Neg liver ROS,   Endo/Other  Hyperthyroidism   Renal/GU negative Renal ROS  negative genitourinary   Musculoskeletal negative musculoskeletal ROS (+)   Abdominal   Peds negative pediatric ROS (+)  Hematology negative hematology ROS (+)   Anesthesia Other Findings Past Medical History: No date: Cardiac disease     Comment:  d/t family history of cardiac issues No date: Diffuse cystic mastopathy No date: Family history of malignant neoplasm of breast No date: Graves disease     Comment:  In remission No date: History of kidney stones     Comment:  history of stone in past No date: Hyperlipidemia No date: Hyperthyroidism     Comment:  remission No date: PONV (postoperative nausea and vomiting)  Reproductive/Obstetrics                             Anesthesia Physical  Anesthesia Plan  ASA: II  Anesthesia Plan: General   Post-op Pain Management:    Induction: Intravenous  PONV Risk Score and Plan: 4 or greater and Treatment may vary due to age or medical condition  Airway Management Planned: Oral ETT  Additional Equipment:   Intra-op Plan:   Post-operative Plan: Extubation in OR  Informed Consent: I have reviewed the patients  History and Physical, chart, labs and discussed the procedure including the risks, benefits and alternatives for the proposed anesthesia with the patient or authorized representative who has indicated his/her understanding and acceptance.     Dental Advisory Given  Plan Discussed with: CRNA  Anesthesia Plan Comments:         Anesthesia Quick Evaluation

## 2020-09-13 NOTE — Anesthesia Procedure Notes (Signed)
Procedure Name: Intubation Performed by: Kelton Pillar, CRNA Pre-anesthesia Checklist: Patient identified, Emergency Drugs available, Suction available and Patient being monitored Patient Re-evaluated:Patient Re-evaluated prior to induction Oxygen Delivery Method: Circle system utilized Preoxygenation: Pre-oxygenation with 100% oxygen Induction Type: IV induction Ventilation: Mask ventilation without difficulty Laryngoscope Size: McGraph and 3 Grade View: Grade I Tube size: 6.5 mm Number of attempts: 1 Airway Equipment and Method: Stylet and Oral airway Placement Confirmation: ETT inserted through vocal cords under direct vision,  positive ETCO2,  breath sounds checked- equal and bilateral and CO2 detector Secured at: 21 cm Tube secured with: Tape Dental Injury: Teeth and Oropharynx as per pre-operative assessment

## 2020-09-13 NOTE — Transfer of Care (Signed)
Immediate Anesthesia Transfer of Care Note  Patient: Beth [name]  Procedure(s) Performed: BREAST LUMPECTOMY, re-excision (Left )  Patient Location: PACU  Anesthesia Type:General  Level of Consciousness: awake, drowsy and patient cooperative  Airway & Oxygen Therapy: Patient Spontanous Breathing and Patient connected to face mask oxygen  Post-op Assessment: Report given to RN and Post -op Vital signs reviewed and stable  Post vital signs: Reviewed and stable  Last Vitals:  Vitals Value Taken Time  BP 138/71 09/13/20 1131  Temp 36.6 C 09/13/20 1131  Pulse 88 09/13/20 1138  Resp 14 09/13/20 1138  SpO2 98 % 09/13/20 1138  Vitals shown include unvalidated device data.  Last Pain:  Vitals:   09/13/20 1131  TempSrc:   PainSc: 0-No pain         Complications: No complications documented.

## 2020-09-13 NOTE — Anesthesia Postprocedure Evaluation (Signed)
Anesthesia Post Note  Patient: Beth [name]  Procedure(s) Performed: BREAST LUMPECTOMY, re-excision (Left )  Patient location during evaluation: PACU Anesthesia Type: General Level of consciousness: awake and alert and oriented Pain management: pain level controlled Vital Signs Assessment: post-procedure vital signs reviewed and stable Respiratory status: spontaneous breathing Cardiovascular status: blood pressure returned to baseline Anesthetic complications: no   No complications documented.   Last Vitals:  Vitals:   09/13/20 1200 09/13/20 1209  BP: 123/74 131/63  Pulse: 89 74  Resp: 18 16  Temp: 36.6 C 37 C  SpO2: 98% 97%    Last Pain:  Vitals:   09/13/20 1209  TempSrc: Temporal  PainSc: 3                  Joandy Burget

## 2020-09-13 NOTE — Discharge Instructions (Signed)

## 2020-09-13 NOTE — H&P (View-Only) (Signed)
Beth [name] is here today for a postoperative visit.  She is a 50 year old woman who underwent left breast lumpectomy, excision of left breast intraductal papilloma, and sentinel lymph node biopsy on August 09, 2020.  She states that she has not had to take any narcotic pain medication, getting adequate relief from ibuprofen and Tylenol.  She denies any drainage from any of the surgical sites, but does state that the Steri-Strips have been rubbing at the axillary site.  She says that she had a low-grade fever (around 99 degrees) the Saturday after surgery, but by the next morning it had resolved and she has been afebrile since then.  She has been gradually increasing the range of motion in her left arm, as instructed.  Final pathology demonstrated:  SURGICAL PATHOLOGY SURGICAL PATHOLOGY  CASE: [id number]  PATIENT: Beth [name]  Surgical Pathology Report      Specimen Submitted:  A. Breast, left, intraductal papilloma  B. Breast, left; additional posterior margin  C. Sentinel node 1, left axilla  D. Sentinel node 2, left axilla  E. Sentinel node 3, left axilla  F. Sentinel node 4, left axilla  G. Non Sentinel node 1, left axilla  H. Non Sentinel node 2, left axilla   Clinical History: Left breast cancer, left breast intraductal papilloma     DIAGNOSIS:  A. BREAST, LEFT; EXCISION:  - BENIGN INTRADUCTAL PAPILLOMA, WITH ASSOCIATED APOCRINE CHANGE, PRESENT  AT INKED AND CAUTERIZED, UNORIENTED MARGIN.  - CLIP AND POSSIBLE BIOPSY SITE IDENTIFIED.  - NEGATIVE FOR ATYPICAL PROLIFERATIVE BREAST DISEASE.   B. BREAST, LEFT, EXCISION WITH ADDITIONAL POSTERIOR MARGIN:  - INVASIVE MAMMARY CARCINOMA, WITH MIXED DUCTAL AND LOBULAR FEATURES.  - DUCTAL CARCINOMA IN SITU, INTERMEDIATE GRADE.  - ASSOCIATED LOBULAR NEOPLASIA.  - SEPARATE INTRADUCTAL PAPILLOMA.  - CLIP AND BIOPSY SITE IDENTIFIED.  - SEE COMMENT AND CANCER SUMMARY.   C. LYMPH NODE, LEFT AXILLARY SENTINEL #1; EXCISION:  - ONE LYMPH  NODE, POSITIVE FOR MACRO METASTATIC CARCINOMA, 5 MM IN  GREATEST LINEAR EXTENT (1/1).   D. LYMPH NODE, LEFT AXILLARY SENTINEL #2; EXCISION:  - ONE LYMPH NODE, POSITIVE FOR MACRO METASTATIC CARCINOMA, 12 MM IN  GREATEST LINEAR EXTENT (1/1).   E. LYMPH NODE, LEFT AXILLARY SENTINEL #3; EXCISION:  - ONE LYMPH NODE, POSITIVE FOR MACRO METASTATIC CARCINOMA, 5 MM IN  GREATEST LINEAR EXTENT (1/1).   F. LYMPH NODE, LEFT AXILLARY SENTINEL #4; EXCISION:  - ONE LYMPH NODE, NEGATIVE FOR MALIGNANCY (0/1).   G. LYMPH NODE, LEFT AXILLARY NONSENTINEL #1; EXCISION:  - ONE LYMPH NODE, NEGATIVE FOR MALIGNANCY (0/1).   H. LYMPH NODE, LEFT AXILLARY 9 SENTINEL #2; EXCISION:  - ONE LYMPH NODE, NEGATIVE FOR MALIGNANCY (0/1).   Comment:  Review of the specimen indicated as left breast lumpectomy and  additional posterior margin identifies clip present within the main  lumpectomy specimen. Microscopic review shows presence of focal  residual invasive mammary carcinoma, present at the posterior aspect of  this main lumpectomy specimen. The second tissue fragment present  within the tissue container, representing the additional posterior  margin, is received uninked and unoriented. Invasive carcinoma is  present at multiple cauterized edges of this small tissue fragment. As  such, the posterior margin is assumed positive for invasive carcinoma.  In addition, transected biopsy site is also identified in the additional  posterior fragment.   Initial review of sentinel lymph nodes shows multiple lymph nodes with  subtle minute areas of subcapsular metastatic carcinoma. Due to the  difficulty in separating  tumor cells from adjacent histiocytes,  immunohistochemical studies directed against pancytokeratin are  performed. These studies highlight metastatic carcinoma present in three  of the sampled sentinel lymph nodes (parts C, D, and E).   CANCER CASE SUMMARY: INVASIVE CARCINOMA OF THE BREAST   Standard(s): AJCC-UICC 8   SPECIMEN  Procedure: Excision  Specimen Laterality: Left   TUMOR  Histologic Type: Invasive carcinoma with mixed ductal and lobular  features  Histologic Grade (Nottingham Histologic Score)            Glandular (Acinar)/Tubular Differentiation: 2            Nuclear Pleomorphism: 2            Mitotic Rate: 1            Overall Grade: Grade 1  Tumor Size: 11 mm  Ductal Carcinoma In Situ (DCIS): Present, intermediate grade  Lymphovascular Invasion: Not identified  Treatment Effect in the Breast: No known presurgical therapy   MARGINS  Margin Status for Invasive Carcinoma: Margin involved by Invasive  Carcinoma            Posterior/deep, multifocal   Margin Status for DCIS: Margin(s) Involved by DCIS:            Posterior/deep, unifocal   REGIONAL LYMPH NODES  Regional Lymph Node Status: Tumor present in regional lymph node(s)            Number of Lymph Nodes with Macrometastases (greater  than 2 mm): 3            Number of Lymph Nodes with Micrometastases (greater  than 0.2 mm to 2 mm and/or greater than 200 cells): 0            Number of Lymph Nodes with Isolated Tumor Cells  (0.2 mm or less OR 200 cells or less): 0            Size of Largest Metastatic Deposit: 12 mm            Extranodal Extension: Not identified            Total Number of Lymph Nodes Examined (sentinel and  non-sentinel): 6            Number of Sentinel Nodes Examined: 4   DISTANT METASTASIS  Distant Site(s) Involved, if applicable: Not applicable   PATHOLOGIC STAGE CLASSIFICATION (pTNM, AJCC 8th Edition):  TNM Descriptors: Not applicable  pT1c  Regional Lymph Nodes Modifier: Not applicable  pN1a  pM - Not applicable   SPECIAL STUDIES  Breast Biomarker Testing Performed on Previous Biopsy: [id number]  Estrogen Receptor (ER)  Status: POSITIVE      Percentage of cells with nuclear positivity: Greater than 90      Average intensity of staining: Moderate   Progesterone Receptor (PgR) Status: POSITIVE      Percentage of cells with nuclear positivity: 70-80      Average intensity of staining: Moderate   HER2 (by immunohistochemistry): NEGATIVE (Score 1+)     \ Today's Vitals   09/13/20 0830  BP: (!) 149/90  Pulse: 100  Resp: 12  Temp: 98.6 F (37 C)  TempSrc: Temporal  SpO2: 100%  PainSc: 0-No pain   There is no height or weight on file to calculate BMI.  Body mass index is 31.69 kg/m. Focused examination of her surgical sites demonstrates that the Steri-Strips are still in place.  These were removed to reveal a well approximated incisions.  There is no erythema, induration,   or drainage present from any of the sites.  The skin at the axilla does appear slightly macerated, but without significant breakdown.  Impression and plan: This is a 50 year old woman with ER/PR positive left breast cancer.  Unfortunately, 3 of 4 sentinel nodes were positive for invasive ductal carcinoma.  In addition, the posterior margin of the lumpectomy was positive as well.  After discussion with Dr. Grayland Ormond, her oncologist, we have agreed to proceed with reexcision lumpectomy and axillary node dissection.  We will work on getting this scheduled in the coming weeks.  In the interim, I have encouraged her to continue range of motion exercises.  I did caution her that the risk of lymphedema increases with the axillary node dissection and that she may require additional physical therapy as well as a compression garment, should this occur.  She will be meeting with Dr. Grayland Ormond and Dr. Baruch Gouty next week.  They will work with her on the next steps of her treatment.  After discussion with Heme/Onc and Rad/Onc, the plan is to simply re-excise the positive margin and not complete the axillary dissection. Discussed with  patient. Will proceed today.

## 2020-09-13 NOTE — Op Note (Signed)
Operative Note  Preoperative Diagnosis: Left breast cancer, positive margins after prior lumpectomy  Postoperative Diagnosis: Same  Operation: Reexcision lumpectomy and adjacent tissue transfer (6 x 2 cm, 12 cm)  Surgeon: [name], MD  Assistant: None  Anesthesia: General  Findings: There was a small seroma cavity associated with the prior surgery.  No evidence of infection.  The tissue was reexcised circumferentially.  To segments were obtained.  Both were sent to pathology for gross margin evaluation.  Both were negative on this evaluation.  Tissue defect after reexcision requiring adjacent tissue transfer of a 6 x 2 cm area.  Indications: Beth [name] is a 50 year old woman who had an abnormal mammogram.  Subsequent biopsies were consistent with invasive mammary carcinoma.  She underwent a lumpectomy and sentinel node biopsy in February 2022.  She had positive sentinel nodes, as well as a positive margin on the posterior aspect of the lumpectomy specimen.  After discussion with both radiation oncology and medical oncology, it was determined that we would not pursue formal axillary node dissection, but simply proceed with reexcision of the lumpectomy site.  The risks of the procedure were discussed with the patient and her husband and they agreed to proceed.  Procedure In Detail: The patient was identified in the preoperative holding area and brought to the operating room where she was placed supine on the OR table.  Bony prominences were padded and bilateral sequential compression devices were placed on the lower extremities.  General anesthesia was induced without incident.  The patient was positioned appropriately for the operation and sterilely prepped and draped in standard fashion.  A timeout was performed confirming her identity, the procedure being performed, her allergies, all necessary equipment was available, and that maintenance anesthesia was adequate.  The skin and  subcutaneous tissue surrounding the prior lumpectomy site were injected with a one-to-one mixture of 0.25% bupivacaine and 1% lidocaine with epinephrine.  The previous incision was then reopened sharply.  A small amount of seroma fluid was suctioned out of the cavity.  The clips indicating the posterior margin were identified.  This tissue was grasped with Allis clamps.  A wide circumferential reexcision was performed using electrocautery.  There were 2 fragments, a medial and a lateral fragment.  Once these were completely excised, they were oriented, marked with paint, and sent to pathology for gross margin evaluation.  The wound bed was irrigated and hemostasis was obtained.  The new posterior margin was marked with small ligaclips.  Due to the large tissue defect, I performed a local tissue transfer, by mobilizing additional breast tissue and filling the 6 x 2 defect with breast tissue and suturing it in place with 2-0 Vicryl suture.  The deep dermal layer was closed with interrupted 3-0 Vicryl.  The skin was closed with running subcuticular Monocryl.  Pathology contacted the OR and indicated that gross margins were clear on both specimens.  The skin was cleaned.  Dermabond and Steri-Strips were applied.  Gauze fluffs and a breast binder were used as an additional dressing.  The patient was awakened, extubated, and taken to the postanesthesia care unit in good condition.  EBL: Less than 2 cc  IVF: See anesthesia record  Specimen(s): New posterior margin medial, new posterior margin lateral  Complications: none immediately apparent.   Counts: all needles, instruments, and sponges were counted and reported to be correct in number at the end of the case.   I was present for and participated in the entire operation.  Beth [name]  Beth [name] 11:45 AM

## 2020-09-13 NOTE — H&P (Signed)
Beth [name] is here today for a postoperative visit.  She is a 50 year old woman who underwent left breast lumpectomy, excision of left breast intraductal papilloma, and sentinel lymph node biopsy on August 09, 2020.  She states that she has not had to take any narcotic pain medication, getting adequate relief from ibuprofen and Tylenol.  She denies any drainage from any of the surgical sites, but does state that the Steri-Strips have been rubbing at the axillary site.  She says that she had a low-grade fever (around 99 degrees) the Saturday after surgery, but by the next morning it had resolved and she has been afebrile since then.  She has been gradually increasing the range of motion in her left arm, as instructed.  Final pathology demonstrated:  SURGICAL PATHOLOGY SURGICAL PATHOLOGY  CASE: [id number]  PATIENT: Beth [name]  Surgical Pathology Report      Specimen Submitted:  A. Breast, left, intraductal papilloma  B. Breast, left; additional posterior margin  C. Sentinel node 1, left axilla  D. Sentinel node 2, left axilla  E. Sentinel node 3, left axilla  F. Sentinel node 4, left axilla  G. Non Sentinel node 1, left axilla  H. Non Sentinel node 2, left axilla   Clinical History: Left breast cancer, left breast intraductal papilloma     DIAGNOSIS:  A. BREAST, LEFT; EXCISION:  - BENIGN INTRADUCTAL PAPILLOMA, WITH ASSOCIATED APOCRINE CHANGE, PRESENT  AT INKED AND CAUTERIZED, UNORIENTED MARGIN.  - CLIP AND POSSIBLE BIOPSY SITE IDENTIFIED.  - NEGATIVE FOR ATYPICAL PROLIFERATIVE BREAST DISEASE.   B. BREAST, LEFT, EXCISION WITH ADDITIONAL POSTERIOR MARGIN:  - INVASIVE MAMMARY CARCINOMA, WITH MIXED DUCTAL AND LOBULAR FEATURES.  - DUCTAL CARCINOMA IN SITU, INTERMEDIATE GRADE.  - ASSOCIATED LOBULAR NEOPLASIA.  - SEPARATE INTRADUCTAL PAPILLOMA.  - CLIP AND BIOPSY SITE IDENTIFIED.  - SEE COMMENT AND CANCER SUMMARY.   C. LYMPH NODE, LEFT AXILLARY SENTINEL #1; EXCISION:  - ONE LYMPH  NODE, POSITIVE FOR MACRO METASTATIC CARCINOMA, 5 MM IN  GREATEST LINEAR EXTENT (1/1).   D. LYMPH NODE, LEFT AXILLARY SENTINEL #2; EXCISION:  - ONE LYMPH NODE, POSITIVE FOR MACRO METASTATIC CARCINOMA, 12 MM IN  GREATEST LINEAR EXTENT (1/1).   E. LYMPH NODE, LEFT AXILLARY SENTINEL #3; EXCISION:  - ONE LYMPH NODE, POSITIVE FOR MACRO METASTATIC CARCINOMA, 5 MM IN  GREATEST LINEAR EXTENT (1/1).   F. LYMPH NODE, LEFT AXILLARY SENTINEL #4; EXCISION:  - ONE LYMPH NODE, NEGATIVE FOR MALIGNANCY (0/1).   G. LYMPH NODE, LEFT AXILLARY NONSENTINEL #1; EXCISION:  - ONE LYMPH NODE, NEGATIVE FOR MALIGNANCY (0/1).   H. LYMPH NODE, LEFT AXILLARY 9 SENTINEL #2; EXCISION:  - ONE LYMPH NODE, NEGATIVE FOR MALIGNANCY (0/1).   Comment:  Review of the specimen indicated as left breast lumpectomy and  additional posterior margin identifies clip present within the main  lumpectomy specimen. Microscopic review shows presence of focal  residual invasive mammary carcinoma, present at the posterior aspect of  this main lumpectomy specimen. The second tissue fragment present  within the tissue container, representing the additional posterior  margin, is received uninked and unoriented. Invasive carcinoma is  present at multiple cauterized edges of this small tissue fragment. As  such, the posterior margin is assumed positive for invasive carcinoma.  In addition, transected biopsy site is also identified in the additional  posterior fragment.   Initial review of sentinel lymph nodes shows multiple lymph nodes with  subtle minute areas of subcapsular metastatic carcinoma. Due to the  difficulty in separating  tumor cells from adjacent histiocytes,  immunohistochemical studies directed against pancytokeratin are  performed. These studies highlight metastatic carcinoma present in three  of the sampled sentinel lymph nodes (parts C, D, and E).   CANCER CASE SUMMARY: INVASIVE CARCINOMA OF THE BREAST   Standard(s): AJCC-UICC 8   SPECIMEN  Procedure: Excision  Specimen Laterality: Left   TUMOR  Histologic Type: Invasive carcinoma with mixed ductal and lobular  features  Histologic Grade (Nottingham Histologic Score)            Glandular (Acinar)/Tubular Differentiation: 2            Nuclear Pleomorphism: 2            Mitotic Rate: 1            Overall Grade: Grade 1  Tumor Size: 11 mm  Ductal Carcinoma In Situ (DCIS): Present, intermediate grade  Lymphovascular Invasion: Not identified  Treatment Effect in the Breast: No known presurgical therapy   MARGINS  Margin Status for Invasive Carcinoma: Margin involved by Invasive  Carcinoma            Posterior/deep, multifocal   Margin Status for DCIS: Margin(s) Involved by DCIS:            Posterior/deep, unifocal   REGIONAL LYMPH NODES  Regional Lymph Node Status: Tumor present in regional lymph node(s)            Number of Lymph Nodes with Macrometastases (greater  than 2 mm): 3            Number of Lymph Nodes with Micrometastases (greater  than 0.2 mm to 2 mm and/or greater than 200 cells): 0            Number of Lymph Nodes with Isolated Tumor Cells  (0.2 mm or less OR 200 cells or less): 0            Size of Largest Metastatic Deposit: 12 mm            Extranodal Extension: Not identified            Total Number of Lymph Nodes Examined (sentinel and  non-sentinel): 6            Number of Sentinel Nodes Examined: 4   DISTANT METASTASIS  Distant Site(s) Involved, if applicable: Not applicable   PATHOLOGIC STAGE CLASSIFICATION (pTNM, AJCC 8th Edition):  TNM Descriptors: Not applicable  pT1c  Regional Lymph Nodes Modifier: Not applicable  pN1a  pM - Not applicable   SPECIAL STUDIES  Breast Biomarker Testing Performed on Previous Biopsy: [id number]  Estrogen Receptor (ER)  Status: POSITIVE      Percentage of cells with nuclear positivity: Greater than 90      Average intensity of staining: Moderate   Progesterone Receptor (PgR) Status: POSITIVE      Percentage of cells with nuclear positivity: 70-80      Average intensity of staining: Moderate   HER2 (by immunohistochemistry): NEGATIVE (Score 1+)     \ Today's Vitals   09/13/20 0830  BP: (!) 149/90  Pulse: 100  Resp: 12  Temp: 98.6 F (37 C)  TempSrc: Temporal  SpO2: 100%  PainSc: 0-No pain   There is no height or weight on file to calculate BMI.  Body mass index is 31.69 kg/m. Focused examination of her surgical sites demonstrates that the Steri-Strips are still in place.  These were removed to reveal a well approximated incisions.  There is no erythema, induration,   or drainage present from any of the sites.  The skin at the axilla does appear slightly macerated, but without significant breakdown.  Impression and plan: This is a 50 year old woman with ER/PR positive left breast cancer.  Unfortunately, 3 of 4 sentinel nodes were positive for invasive ductal carcinoma.  In addition, the posterior margin of the lumpectomy was positive as well.  After discussion with Dr. Grayland Ormond, her oncologist, we have agreed to proceed with reexcision lumpectomy and axillary node dissection.  We will work on getting this scheduled in the coming weeks.  In the interim, I have encouraged her to continue range of motion exercises.  I did caution her that the risk of lymphedema increases with the axillary node dissection and that she may require additional physical therapy as well as a compression garment, should this occur.  She will be meeting with Dr. Grayland Ormond and Dr. Baruch Gouty next week.  They will work with her on the next steps of her treatment.  After discussion with Heme/Onc and Rad/Onc, the plan is to simply re-excise the positive margin and not complete the axillary dissection. Discussed with  patient. Will proceed today.

## 2020-09-14 ENCOUNTER — Encounter: Payer: Self-pay | Admitting: General Surgery

## 2020-09-14 LAB — SURGICAL PATHOLOGY

## 2020-09-16 ENCOUNTER — Telehealth: Payer: Self-pay | Admitting: General Surgery

## 2020-09-16 NOTE — Progress Notes (Signed)
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  Oncotype DX score 3.  INTERVAL HISTORY: Patient returns to clinic today for further evaluation and discussion of her pathology results.  Despite a second resurgent, she still has a 1 mm focus on her pathology specimen and plan is to do an additional resection.  She currently feels well and is asymptomatic.  She has no neurologic complaints.  She denies any recent fevers or illnesses.  She has a good appetite and denies weight loss.  She has no chest pain, shortness of breath, cough, or hemoptysis.  She denies any nausea, vomiting, constipation, or diarrhea.  She has no urinary complaints.  Patient offers no specific complaints today.  REVIEW OF SYSTEMS:   Review of Systems  Constitutional: Negative.  Negative for fever, malaise/fatigue and weight loss.  Respiratory: Negative.  Negative for cough, hemoptysis and shortness of breath.   Cardiovascular: Negative.  Negative for chest pain and leg swelling.  Gastrointestinal: Negative.  Negative for abdominal pain.  Genitourinary: Negative.  Negative for dysuria.  Musculoskeletal: Negative.  Negative for back pain.  Skin: Negative.  Negative for rash.  Neurological: Negative.  Negative for dizziness, focal weakness and weakness.  Psychiatric/Behavioral: Negative.  The patient is not nervous/anxious.     As per HPI. Otherwise, a complete review of systems is negative.  PAST MEDICAL HISTORY: Past Medical History:  Diagnosis  Date  . Anxiety    due to Covid   . Cardiac disease    d/t family history of cardiac issues  . Depression    due to Covid   . Diffuse cystic mastopathy   . Family history of breast cancer   . Family history of malignant neoplasm of breast   . Family history of prostate cancer   . Graves disease    In remission  . History of kidney stones    history of stone in past  . Hyperlipidemia   . Hyperthyroidism    remission  . PONV (postoperative nausea and vomiting)     PAST SURGICAL HISTORY: Past Surgical History:  Procedure Laterality Date  . BREAST BIOPSY Right 2008   benign  . BREAST BIOPSY Left 2/13    aspiration cyst  . BREAST BIOPSY Left 01/2020   Sclerosing intraductal papilloma with apocrine metaplasia. No atypia or malignancy.  Marland Kitchen BREAST BIOPSY Left 07/15/2020   [place] bx, heart marker, path pending  . BREAST DUCTAL SYSTEM EXCISION Left 08/09/2020   Procedure: EXCISION DUCTAL SYSTEM BREAST, left intraductal papilloma;  Surgeon: [name], MD;  Location: [facility];  Service: General;  Laterality: Left;  . BREAST LUMPECTOMY Left 09/13/2020   Procedure: BREAST LUMPECTOMY, re-excision;  Surgeon: [name], MD;  Location: [facility];  Service: General;  Laterality: Left;  . BREAST LUMPECTOMY,RADIO FREQ LOCALIZER,AXILLARY SENTINEL LYMPH NODE BIOPSY Left 08/09/2020   Procedure: BREAST LUMPECTOMY,RADIO FREQ LOCALIZER,AXILLARY SENTINEL LYMPH NODE BIOPSY;  Surgeon: [name], MD;  Location: [facility];  Service: General;  Laterality: Left;  . COLONOSCOPY WITH PROPOFOL N/A 07/12/2020   Procedure: COLONOSCOPY WITH PROPOFOL;  Surgeon: Lucilla Lame, MD;  Location: [place];  Service: Endoscopy;  Laterality: N/A;  PRIORITY 4  . DILATION AND CURETTAGE OF UTERUS  2011  . eyelid surgery     . ORBITAL RECONSTRUCTION Left 2008   graves disease caused "bug eyed"  . POLYPECTOMY  07/12/2020   Procedure: POLYPECTOMY;  Surgeon: Lucilla Lame, MD;  Location: [place];   Service: Endoscopy;;  . WISDOM TOOTH EXTRACTION      FAMILY HISTORY: Family History  Problem Relation Age of Onset  . Diabetes Mother   . Hypertension Mother   . Hyperlipidemia Mother   . Hypertension Father   . Hyperlipidemia Father   . Hypothyroidism Father   . Prostate cancer Father 77  . Hyperlipidemia Sister   . Hypertension Sister   . Hyperlipidemia Sister   . Hypertension Sister   . Diabetes Sister   . Hyperlipidemia Sister   . Hypertension Sister   . Breast cancer Maternal Grandmother 86    ADVANCED DIRECTIVES (Y/N):  N  HEALTH MAINTENANCE: Social History   Tobacco Use  . Smoking status: Never Smoker  . Smokeless tobacco: Never Used  Vaping Use  . Vaping Use: Never used  Substance Use Topics  . Alcohol use: Yes    Comment: very rarely (maybe once a year)  . Drug use: No     Colonoscopy:  PAP:  Bone density:  Lipid panel:  Allergies  Allergen Reactions  . Polytrim [Polymyxin B-Trimethoprim] Rash    Was used on eyes after surgery which gave her a rash     Current Outpatient Medications  Medication Sig Dispense Refill  . acetaminophen (TYLENOL) 650 MG CR tablet Take 1,300 mg by mouth every 8 (eight) hours as needed for pain.    . Calcium Carbonate-Vitamin D (CALCIUM 600+D PO) Take 2 tablets by mouth daily.    Marland Kitchen CRANBERRY PO Take 2 tablets by mouth daily.    Marland Kitchen FIBER PO Take 1 each by mouth daily. Gummy    . fish oil-omega-3 fatty acids 1000 MG capsule Take 2 g by mouth daily.    . Garlic Oil 3976 MG CAPS Take 1,000 mg by mouth daily.    . hydroxypropyl methylcellulose / hypromellose (ISOPTO TEARS / GONIOVISC) 2.5 % ophthalmic solution Place 1 drop into both eyes daily.    . Ibuprofen 200 MG CAPS Take 4 capsules (800 mg total) by mouth every 6 (six) hours as needed (pain, fever, headache). 120 capsule 0  . Ibuprofen-Diphenhydramine Cit (ADVIL PM PO) Take 1 tablet by mouth at bedtime as needed (sleep/insomnia).    . Multiple Vitamin (MULTIVITAMIN WITH  MINERALS) TABS tablet Take 1 tablet by mouth daily. Women's One A Day    . vitamin B-12 (CYANOCOBALAMIN) 1000 MCG tablet Take 1,000 mcg by mouth daily.    Marland Kitchen White Petrolatum-Mineral Oil (GENTEAL TEARS NIGHT-TIME) OINT Apply 1 drop to eye at bedtime.     No current facility-administered medications for this visit.    OBJECTIVE: Vitals:   09/21/20 1105  BP: 131/73  Pulse: 85  Resp: 17  Temp: 99.9 F (37.7 C)  SpO2: 98%     Body mass index is 32.17 kg/m.    ECOG FS:0 - Asymptomatic  General: Well-developed, well-nourished, no acute distress. Eyes: Pink conjunctiva, anicteric sclera. HEENT: Normocephalic, moist mucous membranes. Lungs: No audible wheezing or coughing. Heart: Regular rate and rhythm. Abdomen: Soft, nontender, no obvious distention. Musculoskeletal: No edema, cyanosis, or clubbing. Neuro: Alert, answering all questions appropriately. Cranial nerves grossly intact. Skin: No  rashes or petechiae noted. Psych: Normal affect.  LAB RESULTS:  Lab Results  Component Value Date   NA 138 05/18/2020   K 4.3 05/18/2020   CL 101 05/18/2020   CO2 22 05/18/2020   GLUCOSE 100 (H) 05/18/2020   BUN 13 05/18/2020   CREATININE 0.55 (L) 05/18/2020   CALCIUM 9.4 05/18/2020   PROT 7.2 05/18/2020   ALBUMIN 4.8 05/18/2020   AST 14 05/18/2020   ALT 23 05/18/2020   ALKPHOS 124 (H) 05/18/2020   BILITOT 0.4 05/18/2020   GFRNONAA 110 05/18/2020   GFRAA 126 05/18/2020    Lab Results  Component Value Date   WBC 13.0 (H) 05/18/2020   NEUTROABS 9.0 (H) 05/18/2020   HGB 13.8 05/18/2020   HCT 41.7 05/18/2020   MCV 89 05/18/2020   PLT 389 05/18/2020     STUDIES: No results found.  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  Oncotype DX score 3.   PLAN:    1. 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  Despite reexcision on September 13, 2020, patient was noted to have a persistent 1 mm positive margin plan is to go back for a third excision.  Her Oncotype score was 3 conferring low risk, therefore chemotherapy is not necessary despite her 3+ lymph nodes.  Patient will require adjuvant XRT including full axillary radiation.  She will also benefit from an aromatase inhibitor for a minimum of 5 years at the completion of all her treatments.  A referral was placed to radiation oncology.  Patient will follow up at the conclusion of her treatments to initiate aromatase inhibitor. 2.  Genetic testing: Results are pending at time of dictation.  I spent a total of 30 minutes reviewing chart data, face-to-face evaluation with the patient, counseling and coordination of care as detailed above.   Patient expressed understanding and was in agreement with this plan. She also understands that She can call clinic at any time with any questions, concerns, or complaints.   Cancer Staging 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, left [facility]) Staging form: Breast, AJCC 8th Edition - Pathologic stage from 08/25/2020: Stage IA (pT1c, pN1a, cM0, G1, ER+, PR+, HER2-) - Signed by Lloyd Huger, MD on 08/25/2020 Stage prefix: Initial diagnosis Histologic grading system: 3 grade system   Lloyd Huger, MD   09/23/2020 6:06 AM

## 2020-09-16 NOTE — Telephone Encounter (Signed)
Discussed pathology with patient and husband. Positive inferior margin. Will need additional re-excision.

## 2020-09-21 ENCOUNTER — Encounter: Payer: Self-pay | Admitting: *Deleted

## 2020-09-21 ENCOUNTER — Inpatient Hospital Stay: Payer: Managed Care, Other (non HMO) | Attending: Oncology | Admitting: Oncology

## 2020-09-21 ENCOUNTER — Encounter: Payer: Self-pay | Admitting: Oncology

## 2020-09-21 VITALS — BP 131/73 | HR 85 | Temp 99.9°F | Resp 17 | Wt 187.4 lb

## 2020-09-21 DIAGNOSIS — Z170 Estrogen receptor positive status [ER+]: Secondary | ICD-10-CM | POA: Diagnosis not present

## 2020-09-21 DIAGNOSIS — Z8042 Family history of malignant neoplasm of prostate: Secondary | ICD-10-CM | POA: Diagnosis not present

## 2020-09-21 DIAGNOSIS — Z87442 Personal history of urinary calculi: Secondary | ICD-10-CM | POA: Diagnosis not present

## 2020-09-21 DIAGNOSIS — Z79899 Other long term (current) drug therapy: Secondary | ICD-10-CM | POA: Insufficient documentation

## 2020-09-21 DIAGNOSIS — Z833 Family history of diabetes mellitus: Secondary | ICD-10-CM | POA: Insufficient documentation

## 2020-09-21 DIAGNOSIS — Z881 Allergy status to other antibiotic agents status: Secondary | ICD-10-CM | POA: Diagnosis not present

## 2020-09-21 DIAGNOSIS — Z8249 Family history of ischemic heart disease and other diseases of the circulatory system: Secondary | ICD-10-CM | POA: Diagnosis not present

## 2020-09-21 DIAGNOSIS — C50212 Malignant neoplasm of upper-inner quadrant of left female breast: Secondary | ICD-10-CM | POA: Diagnosis not present

## 2020-09-21 DIAGNOSIS — Z803 Family history of malignant neoplasm of breast: Secondary | ICD-10-CM | POA: Diagnosis not present

## 2020-09-21 DIAGNOSIS — Z8616 Personal history of COVID-19: Secondary | ICD-10-CM | POA: Insufficient documentation

## 2020-09-21 DIAGNOSIS — Z8349 Family history of other endocrine, nutritional and metabolic diseases: Secondary | ICD-10-CM | POA: Diagnosis not present

## 2020-09-21 DIAGNOSIS — Z79811 Long term (current) use of aromatase inhibitors: Secondary | ICD-10-CM | POA: Insufficient documentation

## 2020-09-21 NOTE — Progress Notes (Signed)
Met patient and her husband today during her post surgery follow up.  Patient is going back for re-excision.  She has a low Oncotype Dx score of 3.  Treatment plan for radiation follow by antihormonal therapy.

## 2020-09-23 ENCOUNTER — Encounter: Payer: Self-pay | Admitting: Licensed Clinical Social Worker

## 2020-09-23 ENCOUNTER — Ambulatory Visit: Payer: Self-pay | Admitting: Licensed Clinical Social Worker

## 2020-09-23 ENCOUNTER — Encounter: Payer: Self-pay | Admitting: General Surgery

## 2020-09-23 ENCOUNTER — Telehealth: Payer: Self-pay | Admitting: Licensed Clinical Social Worker

## 2020-09-23 ENCOUNTER — Other Ambulatory Visit: Payer: Self-pay

## 2020-09-23 ENCOUNTER — Ambulatory Visit (INDEPENDENT_AMBULATORY_CARE_PROVIDER_SITE_OTHER): Payer: Managed Care, Other (non HMO) | Admitting: General Surgery

## 2020-09-23 VITALS — BP 142/82 | HR 93 | Temp 98.8°F | Ht 64.0 in | Wt 187.6 lb

## 2020-09-23 DIAGNOSIS — Z1379 Encounter for other screening for genetic and chromosomal anomalies: Secondary | ICD-10-CM

## 2020-09-23 DIAGNOSIS — C50912 Malignant neoplasm of unspecified site of left female breast: Secondary | ICD-10-CM

## 2020-09-23 DIAGNOSIS — C50212 Malignant neoplasm of upper-inner quadrant of left female breast: Secondary | ICD-10-CM

## 2020-09-23 DIAGNOSIS — Z803 Family history of malignant neoplasm of breast: Secondary | ICD-10-CM

## 2020-09-23 DIAGNOSIS — Z8042 Family history of malignant neoplasm of prostate: Secondary | ICD-10-CM

## 2020-09-23 NOTE — Patient Instructions (Signed)
Our surgery scheduler Marzetta Board will call you within 24-48 hours to get you scheduled. If you have not heard from her after 48 hours, please call our office. You will need to get Covid tested before surgery and have the blue sheet available when she calls to write down important information. If you have any concerns or questions, please feel free to call our office.    You may use Deoderant. Continue wearing the sports bra.

## 2020-09-23 NOTE — Progress Notes (Signed)
HPI:  Beth [name] was previously seen in the [place] clinic due to a personal and family history of breast cancer and concerns regarding a hereditary predisposition to cancer. Please refer to our prior cancer genetics clinic note for more information regarding our discussion, assessment and recommendations, at the time. Beth [name] recent genetic test results were disclosed to her, as were recommendations warranted by these results. These results and recommendations are discussed in more detail below.  CANCER HISTORY:  Oncology History  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, left ([place])  07/23/2020 Initial Diagnosis   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, left ([place])   08/25/2020 Cancer Staging   Staging form: Breast, AJCC 8th Edition - Pathologic stage from 08/25/2020: Stage IA (pT1c, pN1a, cM0, G1, ER+, PR+, HER2-) - Signed by Lloyd Huger, MD on 08/25/2020 Stage prefix: Initial diagnosis Histologic grading system: 3 grade system    Genetic Testing   Negative genetic testing. No pathogenic variants identified on the [facility] CancerNext-Expanded+RNA panel. The report date is 09/23/2020.  The CancerNext-Expanded + RNAinsight gene panel offered by [employer] and includes sequencing and rearrangement analysis for the following 77 genes: IP, ALK, APC*, ATM*, AXIN2, BAP1, BARD1, BLM, BMPR1A, BRCA1*, BRCA2*, BRIP1*, CDC73, CDH1*,CDK4, CDKN1B, CDKN2A, CHEK2*, CTNNA1, DICER1, FANCC, FH, FLCN, GALNT12, KIF1B, LZTR1, MAX, MEN1, MET, MLH1*, MSH2*, MSH3, MSH6*, MUTYH*, NBN, NF1*, NF2, NTHL1, PALB2*, PHOX2B, PMS2*, POT1, PRKAR1A, PTCH1, PTEN*, RAD51C*, RAD51D*,RB1, RECQL, RET, SDHA, SDHAF2, SDHB, SDHC, SDHD, SMAD4, SMARCA4, SMARCB1, SMARCE1, STK11, SUFU, TMEM127, TP53*,TSC1, TSC2, VHL and XRCC2 (sequencing and deletion/duplication); EGFR, EGLN1, HOXB13, KIT, MITF, PDGFRA, POLD1 and POLE (sequencing only); EPCAM and GREM1 (deletion/duplication only).     FAMILY HISTORY:   We obtained a detailed, 4-generation family history.  Significant diagnoses are listed below: Family History  Problem Relation Age of Onset  . Diabetes Mother   . Hypertension Mother   . Hyperlipidemia Mother   . Hypertension Father   . Hyperlipidemia Father   . Hypothyroidism Father   . Prostate cancer Father 55  . Hyperlipidemia Sister   . Hypertension Sister   . Hyperlipidemia Sister   . Hypertension Sister   . Diabetes Sister   . Hyperlipidemia Sister   . Hypertension Sister   . Breast cancer Maternal Grandmother 87     Beth [name] has 3 sisters and 1 brother, none have had cancer.   Beth [name] mother is living at 13, no history of cancer. Maternal grandmother had breast cancer at 76 and died at 67. No other known cancers on this side of the family.  Beth [name] father had prostate cancer at 71 and is living at 58. No other known cancers on this side of the family.  Beth [name] is unaware of previous family history of genetic testing for hereditary cancer risks. Patient's maternal ancestors are of [place] descent, and paternal ancestors are of [place] descent. There is no reported Ashkenazi Jewish ancestry. There is no known consanguinity.    GENETIC TEST RESULTS: Genetic testing reported out on 09/23/2020 through the [employer] CancerNext-Expanded+RNA cancer panel found no pathogenic mutations.   The CancerNext-Expanded + RNAinsight gene panel offered by [employer] and includes sequencing and rearrangement analysis for the following 77 genes: IP, ALK, APC*, ATM*, AXIN2, BAP1, BARD1, BLM, BMPR1A, BRCA1*, BRCA2*, BRIP1*, CDC73, CDH1*,CDK4, CDKN1B, CDKN2A, CHEK2*, CTNNA1, DICER1, FANCC, FH, FLCN, GALNT12, KIF1B, LZTR1, MAX, MEN1, MET, MLH1*, MSH2*, MSH3, MSH6*, MUTYH*, NBN, NF1*, NF2, NTHL1, PALB2*, PHOX2B, PMS2*, POT1, PRKAR1A, PTCH1, PTEN*, RAD51C*,  RAD51D*,RB1, RECQL, RET, SDHA, SDHAF2, SDHB, SDHC, SDHD, SMAD4, SMARCA4, SMARCB1, SMARCE1, STK11, SUFU, TMEM127, TP53*,TSC1, TSC2, VHL  and XRCC2 (sequencing and deletion/duplication); EGFR, EGLN1, HOXB13, KIT, MITF, PDGFRA, POLD1 and POLE (sequencing only); EPCAM and GREM1 (deletion/duplication only).   The test report has been scanned into EPIC and is located under the Molecular Pathology section of the Results Review tab.  A portion of the result report is included below for reference.     We discussed with Beth [name] that because current genetic testing is not perfect, it is possible there may be a gene mutation in one of these genes that current testing cannot detect, but that chance is small.  We also discussed, that there could be another gene that has not yet been discovered, or that we have not yet tested, that is responsible for the cancer diagnoses in the family. It is also possible there is a hereditary cause for the cancer in the family that Beth [name] did not inherit and therefore was not identified in her testing.  Therefore, it is important to remain in touch with cancer genetics in the future so that we can continue to offer Beth [name] the most up to date genetic testing.   ADDITIONAL GENETIC TESTING: We discussed with Beth [name] that her genetic testing was fairly extensive.  If there are genes identified to increase cancer risk that can be analyzed in the future, we would be happy to discuss and coordinate this testing at that time.    CANCER SCREENING RECOMMENDATIONS: Beth [name] test result is considered negative (normal).  This means that we have not identified a hereditary cause for her  personal and family history of cancer at this time. Most cancers happen by chance and this negative test suggests that her cancer may fall into this category.    While reassuring, this does not definitively rule out a hereditary predisposition to cancer. It is still possible that there could be genetic mutations that are undetectable by current technology. There could be genetic mutations in genes that have not been tested or identified to  increase cancer risk.  Therefore, it is recommended she continue to follow the cancer management and screening guidelines provided by her oncology and primary healthcare provider.   An individual's cancer risk and medical management are not determined by genetic test results alone. Overall cancer risk assessment incorporates additional factors, including personal medical history, family history, and any available genetic information that may result in a personalized plan for cancer prevention and surveillance.  RECOMMENDATIONS FOR FAMILY MEMBERS:  Relatives in this family might be at some increased risk of developing cancer, over the general population risk, simply due to the family history of cancer.  We recommended female relatives in this family have a yearly mammogram beginning at age 38, or 91 years younger than the earliest onset of cancer, an annual clinical breast exam, and perform monthly breast self-exams. Female relatives in this family should also have a gynecological exam as recommended by their primary provider.  All family members should be referred for colonoscopy starting at age 56.   FOLLOW-UP: Lastly, we discussed with Beth [name] that cancer genetics is a rapidly advancing field and it is possible that new genetic tests will be appropriate for her and/or her family members in the future. We encouraged her to remain in contact with cancer genetics on an annual basis so we can update her personal and family histories and let her know of advances in cancer genetics that  may benefit this family.   Our contact number was provided. Beth [name] questions were answered to her satisfaction, and she knows she is welcome to call us at anytime with additional questions or concerns.   Faith Rogue, MS, [facility] Genetic Counselor [email or web address] Phone: [phone]

## 2020-09-23 NOTE — Progress Notes (Signed)
[name] is here today for a postoperative visit.  She is a 50 year old woman with left breast cancer.  She initially underwent a lumpectomy with sentinel lymph node biopsy on August 09, 2020.  The pathology demonstrated:  SURGICAL PATHOLOGY  CASE: [id number]  PATIENT: Beth [name]  Surgical Pathology Report      Specimen Submitted:  A. Breast, left, intraductal papilloma  B. Breast, left; additional posterior margin  C. Sentinel node 1, left axilla  D. Sentinel node 2, left axilla  E. Sentinel node 3, left axilla  F. Sentinel node 4, left axilla  G. Non Sentinel node 1, left axilla  H. Non Sentinel node 2, left axilla   Clinical History: Left breast cancer, left breast intraductal papilloma     DIAGNOSIS:  A. BREAST, LEFT; EXCISION:  - BENIGN INTRADUCTAL PAPILLOMA, WITH ASSOCIATED APOCRINE CHANGE, PRESENT  AT INKED AND CAUTERIZED, UNORIENTED MARGIN.  - CLIP AND POSSIBLE BIOPSY SITE IDENTIFIED.  - NEGATIVE FOR ATYPICAL PROLIFERATIVE BREAST DISEASE.   B. BREAST, LEFT, EXCISION WITH ADDITIONAL POSTERIOR MARGIN:  - INVASIVE MAMMARY CARCINOMA, WITH MIXED DUCTAL AND LOBULAR FEATURES.  - DUCTAL CARCINOMA IN SITU, INTERMEDIATE GRADE.  - ASSOCIATED LOBULAR NEOPLASIA.  - SEPARATE INTRADUCTAL PAPILLOMA.  - CLIP AND BIOPSY SITE IDENTIFIED.  - SEE COMMENT AND CANCER SUMMARY.   C. LYMPH NODE, LEFT AXILLARY SENTINEL #1; EXCISION:  - ONE LYMPH NODE, POSITIVE FOR MACRO METASTATIC CARCINOMA, 5 MM IN  GREATEST LINEAR EXTENT (1/1).   D. LYMPH NODE, LEFT AXILLARY SENTINEL #2; EXCISION:  - ONE LYMPH NODE, POSITIVE FOR MACRO METASTATIC CARCINOMA, 12 MM IN  GREATEST LINEAR EXTENT (1/1).   E. LYMPH NODE, LEFT AXILLARY SENTINEL #3; EXCISION:  - ONE LYMPH NODE, POSITIVE FOR MACRO METASTATIC CARCINOMA, 5 MM IN  GREATEST LINEAR EXTENT (1/1).   F. LYMPH NODE, LEFT AXILLARY SENTINEL #4; EXCISION:  - ONE LYMPH NODE, NEGATIVE FOR MALIGNANCY (0/1).   G. LYMPH NODE, LEFT AXILLARY NONSENTINEL  #1; EXCISION:  - ONE LYMPH NODE, NEGATIVE FOR MALIGNANCY (0/1).   H. LYMPH NODE, LEFT AXILLARY 9 SENTINEL #2; EXCISION:  - ONE LYMPH NODE, NEGATIVE FOR MALIGNANCY (0/1).   Comment:  Review of the specimen indicated as left breast lumpectomy and  additional posterior margin identifies clip present within the main  lumpectomy specimen. Microscopic review shows presence of focal  residual invasive mammary carcinoma, present at the posterior aspect of  this main lumpectomy specimen. The second tissue fragment present  within the tissue container, representing the additional posterior  margin, is received uninked and unoriented. Invasive carcinoma is  present at multiple cauterized edges of this small tissue fragment. As  such, the posterior margin is assumed positive for invasive carcinoma.  In addition, transected biopsy site is also identified in the additional  posterior fragment.   Initial review of sentinel lymph nodes shows multiple lymph nodes with  subtle minute areas of subcapsular metastatic carcinoma. Due to the  difficulty in separating tumor cells from adjacent histiocytes,  immunohistochemical studies directed against pancytokeratin are  performed. These studies highlight metastatic carcinoma present in three  of the sampled sentinel lymph nodes (parts C, D, and E).   CANCER CASE SUMMARY: INVASIVE CARCINOMA OF THE BREAST  Standard(s): AJCC-UICC 8   SPECIMEN  Procedure: Excision  Specimen Laterality: Left   TUMOR  Histologic Type: Invasive carcinoma with mixed ductal and lobular  features  Histologic Grade (Nottingham Histologic Score)            Glandular (Acinar)/Tubular Differentiation: 2  Nuclear Pleomorphism: 2            Mitotic Rate: 1            Overall Grade: Grade 1  Tumor Size: 11 mm  Ductal Carcinoma In Situ (DCIS): Present, intermediate grade  Lymphovascular Invasion: Not identified  Treatment  Effect in the Breast: No known presurgical therapy   MARGINS  Margin Status for Invasive Carcinoma: Margin involved by Invasive  Carcinoma            Posterior/deep, multifocal   Margin Status for DCIS: Margin(s) Involved by DCIS:            Posterior/deep, unifocal   REGIONAL LYMPH NODES  Regional Lymph Node Status: Tumor present in regional lymph node(s)            Number of Lymph Nodes with Macrometastases (greater  than 2 mm): 3            Number of Lymph Nodes with Micrometastases (greater  than 0.2 mm to 2 mm and/or greater than 200 cells): 0            Number of Lymph Nodes with Isolated Tumor Cells  (0.2 mm or less OR 200 cells or less): 0            Size of Largest Metastatic Deposit: 12 mm            Extranodal Extension: Not identified            Total Number of Lymph Nodes Examined (sentinel and  non-sentinel): 6            Number of Sentinel Nodes Examined: 4   DISTANT METASTASIS  Distant Site(s) Involved, if applicable: Not applicable   PATHOLOGIC STAGE CLASSIFICATION (pTNM, AJCC 8th Edition):  TNM Descriptors: Not applicable  AY3K  Regional Lymph Nodes Modifier: Not applicable  [id number]  pM - Not applicable   SPECIAL STUDIES  Breast Biomarker Testing Performed on Previous Biopsy: [id number]  Estrogen Receptor (ER) Status: POSITIVE      Percentage of cells with nuclear positivity: Greater than 90      Average intensity of staining: Moderate   Progesterone Receptor (PgR) Status: POSITIVE      Percentage of cells with nuclear positivity: 70-80      Average intensity of staining: Moderate   HER2 (by immunohistochemistry): NEGATIVE (Score 1+)   After discussion between radiation oncology, medical oncology, and myself, we determined to not proceed with completion axillary node dissection, but I did perform a reexcision lumpectomy on September 13, 2020.   Unfortunately, the pathology revealed another positive margin:  SURGICAL PATHOLOGY  CASE: [id number]  PATIENT: Beth [name]  Surgical Pathology Report      Specimen Submitted:  A. Breast, left, additional posterior margin, medial; excision  B. Breast, left, additional posterior margin, lateral; excision   Clinical History: Left breast cancer      DIAGNOSIS:  A. BREAST, LEFT, ADDITIONAL POSTERIOR MARGIN, MEDIAL; EXCISION:  - RESIDUAL INVASIVE MAMMARY CARCINOMA, SEE COMMENT.  - INVASIVE CARCINOMA INVOLVES THE INFERIOR MARGIN IN ONE MICROSCOPIC  FOCUS.  - EXTENSIVE LOBULAR CARCINOMA IN SITU.  - FOCAL ATYPICAL DUCTAL HYPERPLASIA.  - FIBROADENOMA, 4 MM.  - MICROPAPILLOMAS.   Comment:  Two areas of invasive carcinoma are identified, histologically similar  to previous specimens, with ductal and lobular features. Both areas are  adjacent to the anterior margin (old posterior margin), and greater than  5 mm from the new posterior margin. The larger focus measures  7 mm and  there is tumor at the inked inferior margin (A15) spanning 1 mm. The  smaller focus measures 4 mm, and it does not involve the margins.   B. BREAST, LEFT, ADDITIONAL POSTERIOR MARGIN, LATERAL; EXCISION:  - ATYPICAL DUCTAL HYPERPLASIA.  - ATYPICAL LOBULAR HYPERPLASIA.  - INTRADUCTAL PAPILLOMA.  - NEGATIVE FOR INVASIVE CARCINOMA.   The patient and her husband met with Dr. Grayland Ormond yesterday and it was recommended that she undergo reexcision.  Fortunately, her Oncotype score was only 3, but she does have fairly extensive lobular carcinoma in situ which may explain why we are having a more aggressive clinical picture than would normally be expected.  She does report that she has done better after this operation, with less pain.  No fevers or chills.  No drainage from the surgical site.  Past Medical History:  Diagnosis Date  . Anxiety    due to Covid   . Cardiac disease    d/t family history of cardiac  issues  . Depression    due to Covid   . Diffuse cystic mastopathy   . Family history of breast cancer   . Family history of malignant neoplasm of breast   . Family history of prostate cancer   . Graves disease    In remission  . History of kidney stones    history of stone in past  . Hyperlipidemia   . Hyperthyroidism    remission  . PONV (postoperative nausea and vomiting)    Past Surgical History:  Procedure Laterality Date  . BREAST BIOPSY Right 2008   benign  . BREAST BIOPSY Left 2/13    aspiration cyst  . BREAST BIOPSY Left 01/2020   Sclerosing intraductal papilloma with apocrine metaplasia. No atypia or malignancy.  Marland Kitchen BREAST BIOPSY Left 07/15/2020   [place] bx, heart marker, path pending  . BREAST DUCTAL SYSTEM EXCISION Left 08/09/2020   Procedure: EXCISION DUCTAL SYSTEM BREAST, left intraductal papilloma;  Surgeon: [name], MD;  Location: [facility];  Service: General;  Laterality: Left;  . BREAST LUMPECTOMY Left 09/13/2020   Procedure: BREAST LUMPECTOMY, re-excision;  Surgeon: [name], MD;  Location: [facility];  Service: General;  Laterality: Left;  . BREAST LUMPECTOMY,RADIO FREQ LOCALIZER,AXILLARY SENTINEL LYMPH NODE BIOPSY Left 08/09/2020   Procedure: BREAST LUMPECTOMY,RADIO FREQ LOCALIZER,AXILLARY SENTINEL LYMPH NODE BIOPSY;  Surgeon: [name], MD;  Location: [facility];  Service: General;  Laterality: Left;  . COLONOSCOPY WITH PROPOFOL N/A 07/12/2020   Procedure: COLONOSCOPY WITH PROPOFOL;  Surgeon: Lucilla Lame, MD;  Location: [place];  Service: Endoscopy;  Laterality: N/A;  PRIORITY 4  . DILATION AND CURETTAGE OF UTERUS  2011  . eyelid surgery     . ORBITAL RECONSTRUCTION Left 2008   graves disease caused "bug eyed"  . POLYPECTOMY  07/12/2020   Procedure: POLYPECTOMY;  Surgeon: Lucilla Lame, MD;  Location: [place];  Service: Endoscopy;;  . WISDOM TOOTH EXTRACTION     Family History  Problem Relation Age of Onset  . Diabetes  Mother   . Hypertension Mother   . Hyperlipidemia Mother   . Hypertension Father   . Hyperlipidemia Father   . Hypothyroidism Father   . Prostate cancer Father 78  . Hyperlipidemia Sister   . Hypertension Sister   . Hyperlipidemia Sister   . Hypertension Sister   . Diabetes Sister   . Hyperlipidemia Sister   . Hypertension Sister   . Breast cancer Maternal Grandmother 87   Social History  Tobacco Use  . Smoking status: Never Smoker  . Smokeless tobacco: Never Used  Vaping Use  . Vaping Use: Never used  Substance Use Topics  . Alcohol use: Yes    Comment: very rarely (maybe once a year)  . Drug use: No   Current Meds  Medication Sig  . acetaminophen (TYLENOL) 650 MG CR tablet Take 1,300 mg by mouth every 8 (eight) hours as needed for pain.  . Calcium Carbonate-Vitamin D (CALCIUM 600+D PO) Take 2 tablets by mouth daily.  Marland Kitchen CRANBERRY PO Take 2 tablets by mouth daily.  Marland Kitchen FIBER PO Take 1 each by mouth daily. Gummy  . fish oil-omega-3 fatty acids 1000 MG capsule Take 2 g by mouth daily.  . Garlic Oil 4259 MG CAPS Take 1,000 mg by mouth daily.  . hydroxypropyl methylcellulose / hypromellose (ISOPTO TEARS / GONIOVISC) 2.5 % ophthalmic solution Place 1 drop into both eyes daily.  . Ibuprofen 200 MG CAPS Take 4 capsules (800 mg total) by mouth every 6 (six) hours as needed (pain, fever, headache).  . Ibuprofen-Diphenhydramine Cit (ADVIL PM PO) Take 1 tablet by mouth at bedtime as needed (sleep/insomnia).  . Multiple Vitamin (MULTIVITAMIN WITH MINERALS) TABS tablet Take 1 tablet by mouth daily. Women's One A Day  . vitamin B-12 (CYANOCOBALAMIN) 1000 MCG tablet Take 1,000 mcg by mouth daily.  Marland Kitchen White Petrolatum-Mineral Oil (GENTEAL TEARS NIGHT-TIME) OINT Apply 1 drop to eye at bedtime.   Allergies  Allergen Reactions  . Polytrim [Polymyxin B-Trimethoprim] Rash    Was used on eyes after surgery which gave her a rash    Today's Vitals   09/23/20 1012  BP: (!) 142/82  Pulse: 93   Temp: 98.8 F (37.1 C)  TempSrc: Oral  SpO2: 95%  Weight: 187 lb 9.6 oz (85.1 kg)  Height: _0  (1.626 m)   Body mass index is 32.2 kg/m. Gen: Alert and oriented x3 CV: Regular rate and rhythm Pulm: Normal work of breathing on room air Surgical site: The Steri-Strips are still in place.  There is no erythema, induration, or drainage present.  I elected to leave them in situ as it has only been a week since her operation.  Impression and plan: This is a 50 year old woman with left breast cancer.  Unfortunately, despite 2 attempts at excision, she has a persistent positive margin.  We will schedule her for a repeat reexcision at the soonest possible date.  The risks of the procedure were discussed with her and her husband.  They will continue to follow with Dr. Grayland Ormond and Dr. Baruch Gouty as well.

## 2020-09-23 NOTE — Telephone Encounter (Signed)
Revealed negative genetic testing.  This normal result is reassuring and indicates that it is unlikely Beth [name]'s cancer is due to a hereditary cause.  It is unlikely that there is an increased risk of another cancer due to a mutation in one of these genes.  However, genetic testing is not perfect, and cannot definitively rule out a hereditary cause.  It will be important for her to keep in contact with genetics to learn if any additional testing may be needed in the future.

## 2020-09-28 ENCOUNTER — Telehealth: Payer: Self-pay | Admitting: General Surgery

## 2020-09-28 NOTE — Telephone Encounter (Signed)
Patient has been advised of Pre-Admission date/time, COVID Testing date and Surgery date.  Surgery Date: 10/08/20 Preadmission Testing Date: 10/04/20 (phone 8a-1p) Covid Testing Date: not needed per pre-admit, patient fully vaccinated and documentation in chart.      Patient has been made aware to call [phone], between 1-3:00pm the day before surgery, to find out what time to arrive for surgery.

## 2020-09-30 ENCOUNTER — Ambulatory Visit
Admission: RE | Admit: 2020-09-30 | Discharge: 2020-09-30 | Disposition: A | Discharge status: Home / Self Care | Payer: Managed Care, Other (non HMO) | Source: Ambulatory Visit | Attending: Radiation Oncology | Admitting: Radiation Oncology

## 2020-09-30 ENCOUNTER — Encounter: Payer: Self-pay | Admitting: Radiation Oncology

## 2020-09-30 ENCOUNTER — Other Ambulatory Visit: Payer: Self-pay

## 2020-09-30 VITALS — BP 145/88 | HR 80 | Temp 97.1°F | Resp 16 | Wt 189.2 lb

## 2020-09-30 DIAGNOSIS — Z170 Estrogen receptor positive status [ER+]: Secondary | ICD-10-CM | POA: Diagnosis not present

## 2020-09-30 DIAGNOSIS — C50212 Malignant neoplasm of upper-inner quadrant of left female breast: Secondary | ICD-10-CM | POA: Diagnosis present

## 2020-09-30 NOTE — Progress Notes (Signed)
Radiation Oncology Follow up Note  Name: Beth [name]   Date:   09/30/2020 MRN:  [id number] DOB: [birth year]    This 50 y.o. female presents to the clinic today for reevaluation to start radiation therapy for stage IIa (T1C1N a M0) invasive mammary carcinoma left breast status post wide local excision.  Tumor is invasive carcinoma with mixed ductal and lobular features ER/PR positive.  She had reexcision although one margin is still positive scheduled for follow-up recent reexcision in about a week's time.  REFERRING PROVIDER: Virginia Crews, MD  HPI: Patient is a 50 year old female originally consulted back in March for stage IIa invasive mammary carcinoma of the left breast.  She had a positive margin she had a reexcision.  1 inferior margin had a microscopic focus of positive carcinoma.  She does depth she had extensive lobular carcinoma in situ.  Tumor remains with ductal and lobular features tumor involved the focal margin.  She is otherwise doing well specifically denies breast tenderness cough or bone pain.  COMPLICATIONS OF TREATMENT: none  FOLLOW UP COMPLIANCE: keeps appointments   PHYSICAL EXAM:  BP (!) 145/88 (BP Location: Right Arm, Patient Position: Sitting)   Pulse 80   Temp (!) 97.1 F (36.2 C) (Tympanic)   Resp 16   Wt 189 lb 3.2 oz (85.8 kg)   LMP 09/17/2020   BMI 32.48 kg/m  She has a wide local excision scar of the left breast which is healing well no dominant masses noted in either breast.  No axillary or supraclavicular adenopathy is appreciated.  Well-developed well-nourished patient in NAD. HEENT reveals PERLA, EOMI, discs not visualized.  Oral cavity is clear. No oral mucosal lesions are identified. Neck is clear without evidence of cervical or supraclavicular adenopathy. Lungs are clear to A&P. Cardiac examination is essentially unremarkable with regular rate and rhythm without murmur rub or thrill. Abdomen is benign with no organomegaly or masses noted. Motor  sensory and DTR levels are equal and symmetric in the upper and lower extremities. Cranial nerves II through XII are grossly intact. Proprioception is intact. No peripheral adenopathy or edema is identified. No motor or sensory levels are noted. Crude visual fields are within normal range.  RADIOLOGY RESULTS: No current films to review  PLAN: This time patient will have reexcision in about a week's time I am putting her on the simulator schedule for 4 weeks down the road which time she should have adequate healing.  I would plan on delivering 5040 cGy in 28 fractions to her left breast and peripheral lymphatics.  I would also boost her scar another 1000 cGy using electron beam.  Risks and benefits of treatment including skin reaction fatigue alteration blood counts possible inclusion of superficial lung and slight chance of lymphedema in left upper extremity all were reviewed in detail with the patient and her husband.  They both seem to comprehend my treatment plan well.  Patient also will be a candidate for antiestrogen therapy after completion of radiation.  I would like to take this opportunity to thank you for allowing me to participate in the care of your patient.Noreene Filbert, MD

## 2020-10-04 ENCOUNTER — Encounter
Admission: RE | Admit: 2020-10-04 | Discharge: 2020-10-04 | Disposition: A | Discharge status: Home / Self Care | Payer: Managed Care, Other (non HMO) | Source: Ambulatory Visit | Attending: General Surgery | Admitting: General Surgery

## 2020-10-04 ENCOUNTER — Encounter: Payer: Self-pay | Admitting: General Surgery

## 2020-10-04 DIAGNOSIS — Z01812 Encounter for preprocedural laboratory examination: Secondary | ICD-10-CM | POA: Insufficient documentation

## 2020-10-04 NOTE — Patient Instructions (Addendum)
Your procedure is scheduled on:10-08-20 FRIDAY Report to the Registration Desk on the 1st floor of the [facility]-Then proceed to the 2nd floor Surgery Desk in the [facility] To find out your arrival time, please call [phone] between 1PM - 3PM on:10-07-20 THURSDAY  REMEMBER: Instructions that are not followed completely may result in serious medical risk, up to and including death; or upon the discretion of your surgeon and anesthesiologist your surgery may need to be rescheduled.  Do not eat food after midnight the night before surgery.  No gum chewing, lozengers or hard candies.  You may however, drink CLEAR liquids up to 2 hours before you are scheduled to arrive for your surgery. Do not drink anything within 2 hours of your scheduled arrival time.  Clear liquids include: - water  - apple juice without pulp - gatorade - black coffee or tea (Do NOT add milk or creamers to the coffee or tea) Do NOT drink anything that is not on this list.  TAKE THESE MEDICATIONS THE MORNING OF SURGERY WITH A SIP OF WATER: -YOU MAY TAKE CLARITIN (LORATADINE) THE DAY OF SURGERY IF NEEDED  One week prior to surgery: Stop Anti-inflammatories (NSAIDS) such as Advil, Aleve, Ibuprofen, Motrin, Naproxen, Naprosyn and Aspirin based products such as Excedrin, Goodys Powder, BC Powder-OK TO TAKE TYLENOL IF NEEDED  Stop ANY OVER THE COUNTER supplements until after surgery-STOP YOUR CRANBERRY, FISH OIL AND GARLIC OIL NOW (11-03-59)-[id number], YOU MAY CONTINUE YOUR MULTIVITAMIN, FIBER, VITAMIN B12 AND CALCIUM-VITAMIN D UP UNTIL THE DAY PRIOR TO SURGERY  No Alcohol for 24 hours before or after surgery.  No Smoking including e-cigarettes for 24 hours prior to surgery.  No chewable tobacco products for at least 6 hours prior to surgery.  No nicotine patches on the day of surgery.  Do not use any "recreational" drugs for at least a week prior to your surgery.  Please be advised that the combination of cocaine  and anesthesia may have negative outcomes, up to and including death. If you test positive for cocaine, your surgery will be cancelled.  On the morning of surgery brush your teeth with toothpaste and water, you may rinse your mouth with mouthwash if you wish. Do not swallow any toothpaste or mouthwash.  Do not wear jewelry, make-up, hairpins, clips or nail polish.  Do not wear lotions, powders, or perfumes.   Do not shave body from the neck down 48 hours prior to surgery just in case you cut yourself which could leave a site for infection.  Also, freshly shaved skin may become irritated if using the CHG soap.  Contact lenses, hearing aids and dentures may not be worn into surgery.  Do not bring valuables to the hospital. [facility] is not responsible for any missing/lost belongings or valuables.   Use CHG Soap as directed on instruction sheet.  Notify your doctor if there is any change in your medical condition (cold, fever, infection).  Wear comfortable clothing (specific to your surgery type) to the hospital.  Plan for stool softeners for home use; pain medications have a tendency to cause constipation. You can also help prevent constipation by eating foods high in fiber such as fruits and vegetables and drinking plenty of fluids as your diet allows.  After surgery, you can help prevent lung complications by doing breathing exercises.  Take deep breaths and cough every 1-2 hours. Your doctor may order a device called an Incentive Spirometer to help you take deep breaths. When coughing or  sneezing, hold a pillow firmly against your incision with both hands. This is called "splinting." Doing this helps protect your incision. It also decreases belly discomfort.  If you are being admitted to the hospital overnight, leave your suitcase in the car. After surgery it may be brought to your room.  If you are being discharged the day of surgery, you will not be allowed to drive home. You  will need a responsible adult (18 years or older) to drive you home and stay with you that night.   If you are taking public transportation, you will need to have a responsible adult (18 years or older) with you. Please confirm with your physician that it is acceptable to use public transportation.   Please call the [facility] at [phone] if you have any questions about these instructions.  Surgery Visitation Policy:  Patients undergoing a surgery or procedure may have one family member or support person with them as long as that person is not COVID-19 positive or experiencing its symptoms.  That person may remain in the waiting area during the procedure.  Inpatient Visitation:    Visiting hours are 7 a.m. to 8 p.m. Inpatients will be allowed two visitors daily. The visitors may change each day during the patient's stay. No visitors under the age of 42. Any visitor under the age of 59 must be accompanied by an adult. The visitor must pass COVID-19 screenings, use hand sanitizer when entering and exiting the patient's room and wear a mask at all times, including in the patient's room. Patients must also wear a mask when staff or their visitor are in the room. Masking is required regardless of vaccination status.

## 2020-10-06 ENCOUNTER — Other Ambulatory Visit: Payer: Self-pay | Admitting: General Surgery

## 2020-10-06 DIAGNOSIS — C50212 Malignant neoplasm of upper-inner quadrant of left female breast: Secondary | ICD-10-CM

## 2020-10-08 ENCOUNTER — Other Ambulatory Visit: Payer: Self-pay

## 2020-10-08 ENCOUNTER — Ambulatory Visit: Payer: Managed Care, Other (non HMO) | Admitting: Urgent Care

## 2020-10-08 ENCOUNTER — Ambulatory Visit
Admission: RE | Admit: 2020-10-08 | Discharge: 2020-10-08 | Disposition: A | Discharge status: Home / Self Care | Payer: Managed Care, Other (non HMO) | Attending: General Surgery | Admitting: General Surgery

## 2020-10-08 ENCOUNTER — Encounter: Payer: Self-pay | Admitting: General Surgery

## 2020-10-08 ENCOUNTER — Encounter
Admission: RE | Disposition: A | Discharge status: Home / Self Care | Payer: Self-pay | Source: Home / Self Care | Attending: General Surgery

## 2020-10-08 DIAGNOSIS — C50812 Malignant neoplasm of overlapping sites of left female breast: Secondary | ICD-10-CM

## 2020-10-08 DIAGNOSIS — C50912 Malignant neoplasm of unspecified site of left female breast: Secondary | ICD-10-CM | POA: Diagnosis not present

## 2020-10-08 DIAGNOSIS — Z803 Family history of malignant neoplasm of breast: Secondary | ICD-10-CM | POA: Diagnosis not present

## 2020-10-08 DIAGNOSIS — C773 Secondary and unspecified malignant neoplasm of axilla and upper limb lymph nodes: Secondary | ICD-10-CM

## 2020-10-08 DIAGNOSIS — M96843 Postprocedural seroma of a musculoskeletal structure following other procedure: Secondary | ICD-10-CM | POA: Diagnosis not present

## 2020-10-08 DIAGNOSIS — N6022 Fibroadenosis of left breast: Secondary | ICD-10-CM | POA: Diagnosis not present

## 2020-10-08 DIAGNOSIS — N6012 Diffuse cystic mastopathy of left breast: Secondary | ICD-10-CM | POA: Insufficient documentation

## 2020-10-08 DIAGNOSIS — C50212 Malignant neoplasm of upper-inner quadrant of left female breast: Secondary | ICD-10-CM

## 2020-10-08 DIAGNOSIS — Z170 Estrogen receptor positive status [ER+]: Secondary | ICD-10-CM

## 2020-10-08 HISTORY — PX: BREAST LUMPECTOMY: SHX2

## 2020-10-08 LAB — POCT PREGNANCY, URINE: Preg Test, Ur: NEGATIVE

## 2020-10-08 SURGERY — BREAST LUMPECTOMY
Anesthesia: General | Laterality: Left

## 2020-10-08 MED ORDER — LIDOCAINE HCL (CARDIAC) PF 100 MG/5ML IV SOSY
PREFILLED_SYRINGE | INTRAVENOUS | Status: DC | PRN
  Administered 2020-10-08: 80 mg via INTRAVENOUS

## 2020-10-08 MED ORDER — MIDAZOLAM HCL 2 MG/2ML IJ SOLN
INTRAMUSCULAR | Status: AC
  Filled 2020-10-08: qty 2

## 2020-10-08 MED ORDER — PROPOFOL 10 MG/ML IV BOLUS
INTRAVENOUS | Status: DC | PRN
  Administered 2020-10-08: 150 mg via INTRAVENOUS

## 2020-10-08 MED ORDER — DEXAMETHASONE SODIUM PHOSPHATE 10 MG/ML IJ SOLN
INTRAMUSCULAR | Status: DC | PRN
  Administered 2020-10-08: 10 mg via INTRAVENOUS

## 2020-10-08 MED ORDER — MIDAZOLAM HCL 2 MG/2ML IJ SOLN
INTRAMUSCULAR | Status: DC | PRN
  Administered 2020-10-08: 2 mg via INTRAVENOUS

## 2020-10-08 MED ORDER — SCOPOLAMINE 1 MG/3DAYS TD PT72: 1.0000 | MEDICATED_PATCH | Freq: Once | TRANSDERMAL | Status: DC

## 2020-10-08 MED ORDER — PROPOFOL 10 MG/ML IV BOLUS
INTRAVENOUS | Status: AC
  Filled 2020-10-08: qty 20

## 2020-10-08 MED ORDER — CHLORHEXIDINE GLUCONATE CLOTH 2 % EX PADS
6.0000 | MEDICATED_PAD | Freq: Once | CUTANEOUS | Status: DC
Start: 2020-10-08 — End: 2020-10-08

## 2020-10-08 MED ORDER — ONDANSETRON HCL 4 MG/2ML IJ SOLN
INTRAMUSCULAR | Status: DC | PRN
  Administered 2020-10-08: 4 mg via INTRAVENOUS

## 2020-10-08 MED ORDER — APREPITANT 40 MG PO CAPS
40.0000 mg | ORAL_CAPSULE | Freq: Once | ORAL | Status: AC
  Administered 2020-10-08: 40 mg via ORAL

## 2020-10-08 MED ORDER — FENTANYL CITRATE (PF) 100 MCG/2ML IJ SOLN
INTRAMUSCULAR | Status: DC | PRN
  Administered 2020-10-08 (×2): 25 ug via INTRAVENOUS

## 2020-10-08 MED ORDER — CEFAZOLIN SODIUM-DEXTROSE 2-4 GM/100ML-% IV SOLN
2.0000 g | INTRAVENOUS | Status: AC
  Administered 2020-10-08: 2 g via INTRAVENOUS

## 2020-10-08 MED ORDER — ACETAMINOPHEN 500 MG PO TABS
ORAL_TABLET | ORAL | Status: AC
  Administered 2020-10-08: 1000 mg via ORAL
  Filled 2020-10-08: qty 2

## 2020-10-08 MED ORDER — GABAPENTIN 300 MG PO CAPS
ORAL_CAPSULE | ORAL | Status: AC
  Administered 2020-10-08: 300 mg via ORAL
  Filled 2020-10-08: qty 1

## 2020-10-08 MED ORDER — ORAL CARE MOUTH RINSE: 15.0000 mL | Freq: Once | OROMUCOSAL | Status: AC

## 2020-10-08 MED ORDER — LACTATED RINGERS IV SOLN: INTRAVENOUS | Status: DC | PRN

## 2020-10-08 MED ORDER — GABAPENTIN 300 MG PO CAPS: 300.0000 mg | ORAL_CAPSULE | ORAL | Status: AC

## 2020-10-08 MED ORDER — LACTATED RINGERS IV SOLN: INTRAVENOUS | Status: DC

## 2020-10-08 MED ORDER — CELECOXIB 200 MG PO CAPS
200.0000 mg | ORAL_CAPSULE | ORAL | Status: AC
Start: 2020-10-08 — End: 2020-10-08

## 2020-10-08 MED ORDER — ACETAMINOPHEN 500 MG PO TABS: 1000.0000 mg | ORAL_TABLET | ORAL | Status: AC

## 2020-10-08 MED ORDER — LIDOCAINE-EPINEPHRINE 1 %-1:100000 IJ SOLN
INTRAMUSCULAR | Status: DC | PRN
  Administered 2020-10-08: 10 mL via INTRAMUSCULAR
  Administered 2020-10-08: 6 mL via INTRAMUSCULAR

## 2020-10-08 MED ORDER — CELECOXIB 200 MG PO CAPS
ORAL_CAPSULE | ORAL | Status: AC
  Administered 2020-10-08: 200 mg via ORAL
  Filled 2020-10-08: qty 1

## 2020-10-08 MED ORDER — OXYCODONE HCL 5 MG PO TABS: 5.0000 mg | ORAL_TABLET | Freq: Four times a day (QID) | ORAL | 0 refills | Status: DC | PRN

## 2020-10-08 MED ORDER — FAMOTIDINE 20 MG PO TABS: 20.0000 mg | ORAL_TABLET | Freq: Once | ORAL | Status: AC

## 2020-10-08 MED ORDER — LIDOCAINE-EPINEPHRINE 1 %-1:100000 IJ SOLN
INTRAMUSCULAR | Status: AC
  Filled 2020-10-08: qty 1

## 2020-10-08 MED ORDER — CHLORHEXIDINE GLUCONATE 0.12 % MT SOLN: 15.0000 mL | Freq: Once | OROMUCOSAL | Status: AC

## 2020-10-08 MED ORDER — BUPIVACAINE HCL (PF) 0.25 % IJ SOLN
INTRAMUSCULAR | Status: AC
  Filled 2020-10-08: qty 30

## 2020-10-08 MED ORDER — FENTANYL CITRATE (PF) 100 MCG/2ML IJ SOLN
INTRAMUSCULAR | Status: AC
  Filled 2020-10-08: qty 2

## 2020-10-08 MED ORDER — APREPITANT 40 MG PO CAPS
ORAL_CAPSULE | ORAL | Status: AC
  Filled 2020-10-08: qty 1

## 2020-10-08 MED ORDER — FAMOTIDINE 20 MG PO TABS
ORAL_TABLET | ORAL | Status: AC
  Administered 2020-10-08: 20 mg via ORAL
  Filled 2020-10-08: qty 1

## 2020-10-08 MED ORDER — PHENYLEPHRINE HCL (PRESSORS) 10 MG/ML IV SOLN
INTRAVENOUS | Status: DC | PRN
  Administered 2020-10-08 (×4): 100 ug via INTRAVENOUS

## 2020-10-08 MED ORDER — CHLORHEXIDINE GLUCONATE CLOTH 2 % EX PADS: 6.0000 | MEDICATED_PAD | Freq: Once | CUTANEOUS | Status: DC

## 2020-10-08 MED ORDER — CHLORHEXIDINE GLUCONATE 0.12 % MT SOLN
OROMUCOSAL | Status: AC
  Administered 2020-10-08: 15 mL via OROMUCOSAL
  Filled 2020-10-08: qty 15

## 2020-10-08 MED ORDER — SCOPOLAMINE 1 MG/3DAYS TD PT72
MEDICATED_PATCH | TRANSDERMAL | Status: AC
  Administered 2020-10-08: 1.5 mg via TRANSDERMAL
  Filled 2020-10-08: qty 1

## 2020-10-08 MED ORDER — CEFAZOLIN SODIUM-DEXTROSE 2-4 GM/100ML-% IV SOLN
INTRAVENOUS | Status: AC
  Filled 2020-10-08: qty 100

## 2020-10-08 SURGICAL SUPPLY — 40 items
ADH SKN CLS APL DERMABOND .7 (GAUZE/BANDAGES/DRESSINGS) ×1
APL PRP STRL LF DISP 70% ISPRP (MISCELLANEOUS) ×1
BINDER BREAST LRG (GAUZE/BANDAGES/DRESSINGS) ×4 IMPLANT
BLADE SURG 15 STRL LF DISP TIS (BLADE) ×2 IMPLANT
BLADE SURG 15 STRL SS (BLADE) ×4
CHLORAPREP W/TINT 26 (MISCELLANEOUS) ×2 IMPLANT
CLIP VESOCCLUDE SM WIDE 6/CT (CLIP) ×2 IMPLANT
CNTNR SPEC 2.5X3XGRAD LEK (MISCELLANEOUS) ×2
CONT SPEC 4OZ STER OR WHT (MISCELLANEOUS) ×2
CONT SPEC 4OZ STRL OR WHT (MISCELLANEOUS) ×2
CONTAINER SPEC 2.5X3XGRAD LEK (MISCELLANEOUS) ×2 IMPLANT
COVER WAND RF STERILE (DRAPES) ×2 IMPLANT
DERMABOND ADVANCED (GAUZE/BANDAGES/DRESSINGS) ×1
DERMABOND ADVANCED .7 DNX12 (GAUZE/BANDAGES/DRESSINGS) ×1 IMPLANT
DRAPE LAPAROTOMY 77X122 PED (DRAPES) ×2 IMPLANT
DRSG GAUZE FLUFF 36X18 (GAUZE/BANDAGES/DRESSINGS) ×2 IMPLANT
ELECT CAUTERY BLADE TIP 2.5 (TIP) ×2
ELECT REM PT RETURN 9FT ADLT (ELECTROSURGICAL) ×2
ELECTRODE CAUTERY BLDE TIP 2.5 (TIP) ×1 IMPLANT
ELECTRODE REM PT RTRN 9FT ADLT (ELECTROSURGICAL) ×1 IMPLANT
GLOVE SURG ENC MOIS LTX SZ6.5 (GLOVE) ×2 IMPLANT
GLOVE SURG UNDER LTX SZ7 (GLOVE) ×4 IMPLANT
GOWN STRL REUS W/ TWL LRG LVL3 (GOWN DISPOSABLE) ×2 IMPLANT
GOWN STRL REUS W/TWL LRG LVL3 (GOWN DISPOSABLE) ×4
KIT MARKER MARGIN INK (KITS) ×2 IMPLANT
KIT TURNOVER KIT A (KITS) ×2 IMPLANT
LABEL OR SOLS (LABEL) ×2 IMPLANT
MANIFOLD NEPTUNE II (INSTRUMENTS) ×2 IMPLANT
NEEDLE HYPO 25X1 1.5 SAFETY (NEEDLE) ×2 IMPLANT
PACK BASIN MINOR ARMC (MISCELLANEOUS) ×2 IMPLANT
STRIP CLOSURE SKIN 1/2X4 (GAUZE/BANDAGES/DRESSINGS) ×4 IMPLANT
SUT MNCRL 4-0 (SUTURE) ×2
SUT MNCRL 4-0 27XMFL (SUTURE) ×1
SUT SILK 2 0 SH (SUTURE) ×2 IMPLANT
SUT VIC AB 3-0 SH 27 (SUTURE) ×4
SUT VIC AB 3-0 SH 27X BRD (SUTURE) ×2 IMPLANT
SUTURE MNCRL 4-0 27XMF (SUTURE) ×1 IMPLANT
SYR 10ML LL (SYRINGE) ×2 IMPLANT
SYR BULB IRRIG 60ML STRL (SYRINGE) ×2 IMPLANT
TAPE STRIPS DRAPE STRL (GAUZE/BANDAGES/DRESSINGS) ×2 IMPLANT

## 2020-10-08 NOTE — Op Note (Signed)
Operative Note  Preoperative Diagnosis: Left breast cancer, positive margin from prior lumpectomy  Postoperative Diagnosis: Same  Operation: Reexcision left breast lumpectomy  Surgeon: [name], MD  Assistant: None  Anesthesia: General, via laryngeal mask airway  Findings: Small seroma cavity from prior surgery.  Gross margins clear after reexcision.  Indications: This is a 50 year old woman who was diagnosed with left breast cancer.  She underwent a lumpectomy with sentinel lymph node biopsy.  She had positive margins from the lumpectomy site.  She underwent reexcision, but again, there was a positive margin.  She was recommended to undergo a repeat reexcision to try and obtain clear margins.  Procedure In Detail: The patient was identified in the preoperative holding area and brought to the operating room where she was placed supine on the OR table.  Bony prominences were padded and bilateral sequential compression devices were placed on the lower extremities.  General anesthesia was induced via laryngeal mask airway.  The patient was positioned for the operation and sterilely prepped and draped in standard fashion.  A timeout is performed confirming her identity, the procedure being performed, her allergies, all necessary equipment was available, and that maintenance anesthesia was adequate.  The skin and subcutaneous tissues surrounding the prior excision site were infiltrated with a one-to-one mixture of 0.25% bupivacaine and 1% lidocaine with epinephrine.  The incision was then reopened and any remaining sutures were cut.  There was a small seroma cavity that was evacuated.  I then identified the inferior border of the prior excision site.  The tissue was grasped with an Allis clamp and dissected away from the surrounding tissues and down to the chest wall.  There was a small amount of bleeding from the pectoralis muscle that was controlled with electrocautery.  The tissue was  excised, inked, and sent to pathology for gross margin evaluation.  On inspection of the cavity, there was some additional fibrous tissue that I felt would be prudent to excise.  This was grasped with the Allis clamp and dissected away from the remaining tissue with electrocautery.  It was also similarly inked and sent to pathology.  The cavity was irrigated and hemostasis confirmed.  The wound was closed in several layers, using 3-0 Vicryl to reduce the cavity space by reapproximating layers of breast tissue.  The deep dermal layer was closed with interrupted 3-0 Vicryl and the skin was closed with running subcuticular Monocryl.  Pathology confirmed negative gross margins.  The skin was cleaned.  Dermabond and Steri-Strips were applied.  Gauze fluffs and a breast binder were used as a final dressing.  The patient was awakened from anesthesia and taken to the postanesthesia care unit in good condition.  EBL: Less than 5 cc  IVF: See anesthesia record  Specimen(s): Left breast lumpectomy reexcision, additional inferior margin  Complications: none immediately apparent.   Counts: all needles, instruments, and sponges were counted and reported to be correct in number at the end of the case.   I was present for and participated in the entire operation.  [name] 11:27 AM

## 2020-10-08 NOTE — Anesthesia Procedure Notes (Signed)
Procedure Name: LMA Insertion Date/Time: 10/08/2020 10:18 AM Performed by: Willette Alma, CRNA Pre-anesthesia Checklist: Patient identified, Patient being monitored, Timeout performed, Emergency Drugs available and Suction available Patient Re-evaluated:Patient Re-evaluated prior to induction Oxygen Delivery Method: Circle system utilized Preoxygenation: Pre-oxygenation with 100% oxygen Induction Type: IV induction Ventilation: Mask ventilation without difficulty LMA: LMA inserted LMA Size: 4.5 Tube type: Oral Number of attempts: 1 Placement Confirmation: positive ETCO2 and breath sounds checked- equal and bilateral Tube secured with: Tape Dental Injury: Teeth and Oropharynx as per pre-operative assessment

## 2020-10-08 NOTE — Anesthesia Preprocedure Evaluation (Signed)
Anesthesia Evaluation  Patient identified by MRN, date of birth, ID band Patient awake    Reviewed: Allergy & Precautions, H&P , NPO status , Patient's Chart, lab work & pertinent test results  History of Anesthesia Complications (+) PONV and history of anesthetic complications  Airway Mallampati: III  TM Distance: <3 FB Neck ROM: full    Dental no notable dental hx. (+) Dental Advidsory Given   Pulmonary neg pulmonary ROS,    Pulmonary exam normal breath sounds clear to auscultation       Cardiovascular negative cardio ROS Normal cardiovascular exam Rhythm:regular Rate:Normal     Neuro/Psych PSYCHIATRIC DISORDERS Anxiety Depression negative neurological ROS     GI/Hepatic negative GI ROS, Neg liver ROS,   Endo/Other  neg diabetesHyperthyroidism   Renal/GU negative Renal ROS  negative genitourinary   Musculoskeletal negative musculoskeletal ROS (+)   Abdominal   Peds negative pediatric ROS (+)  Hematology negative hematology ROS (+)   Anesthesia Other Findings Past Medical History: No date: Cardiac disease     Comment:  d/t family history of cardiac issues No date: Diffuse cystic mastopathy No date: Family history of malignant neoplasm of breast No date: Graves disease     Comment:  In remission No date: History of kidney stones     Comment:  history of stone in past No date: Hyperlipidemia No date: Hyperthyroidism     Comment:  remission No date: PONV (postoperative nausea and vomiting)  Reproductive/Obstetrics                             Anesthesia Physical  Anesthesia Plan  ASA: II  Anesthesia Plan: General   Post-op Pain Management:    Induction: Intravenous  PONV Risk Score and Plan: 4 or greater and Treatment may vary due to age or medical condition, Ondansetron, Dexamethasone, Midazolam, Scopolamine patch - Pre-op and Promethazine  Airway Management Planned:  LMA  Additional Equipment:   Intra-op Plan:   Post-operative Plan: Extubation in OR  Informed Consent: I have reviewed the patients History and Physical, chart, labs and discussed the procedure including the risks, benefits and alternatives for the proposed anesthesia with the patient or authorized representative who has indicated his/her understanding and acceptance.     Dental Advisory Given  Plan Discussed with: CRNA  Anesthesia Plan Comments:         Anesthesia Quick Evaluation

## 2020-10-08 NOTE — Anesthesia Postprocedure Evaluation (Signed)
Anesthesia Post Note  Patient: Marcie Bal Guerreiro  Procedure(s) Performed: BREAST LUMPECTOMY, re-excision (Left )  Patient location during evaluation: PACU Anesthesia Type: General Level of consciousness: awake and alert Pain management: pain level controlled Vital Signs Assessment: post-procedure vital signs reviewed and stable Respiratory status: spontaneous breathing, nonlabored ventilation, respiratory function stable and patient connected to nasal cannula oxygen Cardiovascular status: blood pressure returned to baseline and stable Postop Assessment: no apparent nausea or vomiting Anesthetic complications: no   No complications documented.   Last Vitals:  Vitals:   10/08/20 1200 10/08/20 1214  BP: 113/73 119/64  Pulse: 87 71  Resp: 14 18  Temp: 37.1 C 36.9 C  SpO2: 98% 100%    Last Pain:  Vitals:   10/08/20 1214  TempSrc: Temporal  PainSc: 1                  Jamesina Clan

## 2020-10-08 NOTE — Discharge Instructions (Signed)
AMBULATORY SURGERY  °DISCHARGE INSTRUCTIONS ° ° °1) The drugs that you were given will stay in your system until tomorrow so for the next 24 hours you should not: ° °A) Drive an automobile °B) Make any legal decisions °C) Drink any alcoholic beverage ° ° °2) You may resume regular meals tomorrow.  Today it is better to start with liquids and gradually work up to solid foods. ° °You may eat anything you prefer, but it is better to start with liquids, then soup and crackers, and gradually work up to solid foods. ° ° °3) Please notify your doctor immediately if you have any unusual bleeding, trouble breathing, redness and pain at the surgery site, drainage, fever, or pain not relieved by medication. ° ° ° °4) Additional Instructions: ° ° ° ° ° ° ° °Please contact your physician with any problems or Same Day Surgery at [phone], Monday through Friday 6 am to 4 pm, or [place] at [place] Main number at [phone]. °

## 2020-10-08 NOTE — Transfer of Care (Signed)
Immediate Anesthesia Transfer of Care Note  Patient: Beth [name]  Procedure(s) Performed: BREAST LUMPECTOMY, re-excision (Left )  Patient Location: PACU  Anesthesia Type:General  Level of Consciousness: awake, alert  and oriented  Airway & Oxygen Therapy: Patient Spontanous Breathing and Patient connected to face mask oxygen  Post-op Assessment: Report given to RN and Post -op Vital signs reviewed and stable  Post vital signs: Reviewed and stable  Last Vitals:  Vitals Value Taken Time  BP 127/72 10/08/20 1119  Temp    Pulse 90 10/08/20 1124  Resp 15 10/08/20 1124  SpO2 100 % 10/08/20 1124  Vitals shown include unvalidated device data.  Last Pain:  Vitals:   10/08/20 0819  TempSrc: Temporal  PainSc: 0-No pain      Patients Stated Pain Goal: 0 (37/36/68 1594)  Complications: No complications documented.

## 2020-10-08 NOTE — Interval H&P Note (Signed)
History and Physical Interval Note:  10/08/2020 10:01 AM  Beth [name]  has presented today for surgery, with the diagnosis of left breast cancer, positive margin.  The various methods of treatment have been discussed with the patient and family. After consideration of risks, benefits and other options for treatment, the patient has consented to  Procedure(s): BREAST LUMPECTOMY, re-excision (Left) as a surgical intervention.  The patient's history has been reviewed, patient examined, no change in status, stable for surgery.  I have reviewed the patient's chart and labs.  Questions were answered to the patient's satisfaction.     [name]

## 2020-10-09 ENCOUNTER — Encounter: Payer: Self-pay | Admitting: General Surgery

## 2020-10-12 ENCOUNTER — Telehealth: Payer: Self-pay | Admitting: General Surgery

## 2020-10-12 LAB — SURGICAL PATHOLOGY

## 2020-10-12 NOTE — Telephone Encounter (Signed)
Discussed path from 3rd re-excision. Margins negative.

## 2020-10-21 ENCOUNTER — Other Ambulatory Visit: Payer: Self-pay

## 2020-10-21 ENCOUNTER — Encounter: Payer: Self-pay | Admitting: General Surgery

## 2020-10-21 ENCOUNTER — Ambulatory Visit (INDEPENDENT_AMBULATORY_CARE_PROVIDER_SITE_OTHER): Payer: Managed Care, Other (non HMO) | Admitting: General Surgery

## 2020-10-21 VITALS — BP 150/85 | HR 77 | Temp 99.4°F | Ht 64.0 in | Wt 192.0 lb

## 2020-10-21 DIAGNOSIS — C50212 Malignant neoplasm of upper-inner quadrant of left female breast: Secondary | ICD-10-CM

## 2020-10-21 NOTE — Progress Notes (Signed)
Beth [name] is here today for postoperative follow-up.  She is a 50 year old woman with left breast cancer who had positive margins x2 after lumpectomy.  She underwent a third excision on October 08, 2020 and this time, the margins were clear.  She reports that she has done well since her operation.  She has not required any pain medication stronger than Tylenol.  Denies any fevers or chills.  No nausea or vomiting.  Denies any drainage from her wound.  Today's Vitals   10/21/20 0918  BP: (!) 150/85  Pulse: 77  Temp: 99.4 F (37.4 C)  TempSrc: Oral  SpO2: 95%  Weight: 192 lb (87.1 kg)  Height: $Remove'5\' 4"'aHrFeqX$  (1.626 m)   Body mass index is 32.96 kg/m. Focused examination of the surgical site demonstrates that the Steri-Strips are still in place.  These were removed to reveal a well approximated incision without erythema, induration, or drainage.  No seroma or hematoma appreciated.  Impression and plan: This is a 50 year old woman with ER/PR positive HER2 negative left breast cancer.  She did have positive sentinel nodes but we elected to not proceed with completion axillary lymph node dissection due to the limited improvement in survival and increased risk of lymphedema.  She is scheduled to undergo simulation with Dr. Donella Stade for her radiation treatment next week.  She should continue to follow with him and with Dr. Grayland Ormond.  I will see her on an as-needed basis.

## 2020-10-21 NOTE — Patient Instructions (Addendum)
Please call if you have any questions or concerns.  Massage Vitamin E oil or Mraderma on the area to help with the scarring. You may resume normal activities at this time.

## 2020-10-28 ENCOUNTER — Ambulatory Visit
Admission: RE | Admit: 2020-10-28 | Discharge: 2020-10-28 | Disposition: A | Discharge status: Home / Self Care | Payer: Managed Care, Other (non HMO) | Source: Ambulatory Visit | Attending: Radiation Oncology | Admitting: Radiation Oncology

## 2020-10-28 DIAGNOSIS — Z510 Encounter for antineoplastic radiation therapy: Secondary | ICD-10-CM | POA: Insufficient documentation

## 2020-10-28 DIAGNOSIS — C50212 Malignant neoplasm of upper-inner quadrant of left female breast: Secondary | ICD-10-CM | POA: Insufficient documentation

## 2020-10-29 ENCOUNTER — Other Ambulatory Visit: Payer: Self-pay | Admitting: *Deleted

## 2020-10-29 DIAGNOSIS — C50212 Malignant neoplasm of upper-inner quadrant of left female breast: Secondary | ICD-10-CM

## 2020-11-01 DIAGNOSIS — Z510 Encounter for antineoplastic radiation therapy: Secondary | ICD-10-CM | POA: Diagnosis not present

## 2020-11-03 ENCOUNTER — Ambulatory Visit: Admission: RE | Admit: 2020-11-03 | Payer: Managed Care, Other (non HMO) | Source: Ambulatory Visit

## 2020-11-03 DIAGNOSIS — Z510 Encounter for antineoplastic radiation therapy: Secondary | ICD-10-CM | POA: Diagnosis not present

## 2020-11-04 ENCOUNTER — Ambulatory Visit
Admission: RE | Admit: 2020-11-04 | Discharge: 2020-11-04 | Disposition: A | Discharge status: Home / Self Care | Payer: Managed Care, Other (non HMO) | Source: Ambulatory Visit | Attending: Radiation Oncology | Admitting: Radiation Oncology

## 2020-11-04 DIAGNOSIS — Z510 Encounter for antineoplastic radiation therapy: Secondary | ICD-10-CM | POA: Diagnosis not present

## 2020-11-05 ENCOUNTER — Ambulatory Visit
Admission: RE | Admit: 2020-11-05 | Discharge: 2020-11-05 | Disposition: A | Discharge status: Home / Self Care | Payer: Managed Care, Other (non HMO) | Source: Ambulatory Visit | Attending: Radiation Oncology | Admitting: Radiation Oncology

## 2020-11-05 DIAGNOSIS — Z510 Encounter for antineoplastic radiation therapy: Secondary | ICD-10-CM | POA: Diagnosis not present

## 2020-11-08 ENCOUNTER — Ambulatory Visit
Admission: RE | Admit: 2020-11-08 | Discharge: 2020-11-08 | Disposition: A | Discharge status: Home / Self Care | Payer: Managed Care, Other (non HMO) | Source: Ambulatory Visit | Attending: Radiation Oncology | Admitting: Radiation Oncology

## 2020-11-08 DIAGNOSIS — Z510 Encounter for antineoplastic radiation therapy: Secondary | ICD-10-CM | POA: Diagnosis not present

## 2020-11-09 ENCOUNTER — Ambulatory Visit
Admission: RE | Admit: 2020-11-09 | Discharge: 2020-11-09 | Disposition: A | Discharge status: Home / Self Care | Payer: Managed Care, Other (non HMO) | Source: Ambulatory Visit | Attending: Radiation Oncology | Admitting: Radiation Oncology

## 2020-11-09 DIAGNOSIS — Z510 Encounter for antineoplastic radiation therapy: Secondary | ICD-10-CM | POA: Diagnosis not present

## 2020-11-10 ENCOUNTER — Ambulatory Visit
Admission: RE | Admit: 2020-11-10 | Discharge: 2020-11-10 | Disposition: A | Discharge status: Home / Self Care | Payer: Managed Care, Other (non HMO) | Source: Ambulatory Visit | Attending: Radiation Oncology | Admitting: Radiation Oncology

## 2020-11-10 DIAGNOSIS — Z510 Encounter for antineoplastic radiation therapy: Secondary | ICD-10-CM | POA: Diagnosis not present

## 2020-11-11 ENCOUNTER — Ambulatory Visit
Admission: RE | Admit: 2020-11-11 | Discharge: 2020-11-11 | Disposition: A | Discharge status: Home / Self Care | Payer: Managed Care, Other (non HMO) | Source: Ambulatory Visit | Attending: Radiation Oncology | Admitting: Radiation Oncology

## 2020-11-11 DIAGNOSIS — Z510 Encounter for antineoplastic radiation therapy: Secondary | ICD-10-CM | POA: Diagnosis not present

## 2020-11-12 ENCOUNTER — Ambulatory Visit: Payer: Managed Care, Other (non HMO)

## 2020-11-16 ENCOUNTER — Ambulatory Visit
Admission: RE | Admit: 2020-11-16 | Discharge: 2020-11-16 | Disposition: A | Discharge status: Home / Self Care | Payer: Managed Care, Other (non HMO) | Source: Ambulatory Visit | Attending: Radiation Oncology | Admitting: Radiation Oncology

## 2020-11-16 DIAGNOSIS — Z510 Encounter for antineoplastic radiation therapy: Secondary | ICD-10-CM | POA: Diagnosis not present

## 2020-11-17 ENCOUNTER — Ambulatory Visit
Admission: RE | Admit: 2020-11-17 | Discharge: 2020-11-17 | Disposition: A | Discharge status: Home / Self Care | Payer: Managed Care, Other (non HMO) | Source: Ambulatory Visit | Attending: Radiation Oncology | Admitting: Radiation Oncology

## 2020-11-17 DIAGNOSIS — C50212 Malignant neoplasm of upper-inner quadrant of left female breast: Secondary | ICD-10-CM | POA: Insufficient documentation

## 2020-11-17 DIAGNOSIS — Z170 Estrogen receptor positive status [ER+]: Secondary | ICD-10-CM | POA: Diagnosis not present

## 2020-11-17 DIAGNOSIS — Z510 Encounter for antineoplastic radiation therapy: Secondary | ICD-10-CM | POA: Insufficient documentation

## 2020-11-18 ENCOUNTER — Other Ambulatory Visit: Payer: Self-pay

## 2020-11-18 ENCOUNTER — Inpatient Hospital Stay: Payer: Managed Care, Other (non HMO) | Attending: Oncology

## 2020-11-18 ENCOUNTER — Ambulatory Visit
Admission: RE | Admit: 2020-11-18 | Discharge: 2020-11-18 | Disposition: A | Discharge status: Home / Self Care | Payer: Managed Care, Other (non HMO) | Source: Ambulatory Visit | Attending: Radiation Oncology | Admitting: Radiation Oncology

## 2020-11-18 DIAGNOSIS — Z510 Encounter for antineoplastic radiation therapy: Secondary | ICD-10-CM | POA: Insufficient documentation

## 2020-11-18 DIAGNOSIS — C50212 Malignant neoplasm of upper-inner quadrant of left female breast: Secondary | ICD-10-CM | POA: Diagnosis not present

## 2020-11-18 DIAGNOSIS — Z170 Estrogen receptor positive status [ER+]: Secondary | ICD-10-CM | POA: Insufficient documentation

## 2020-11-18 LAB — CBC
HCT: 38 % (ref 36.0–46.0)
Hemoglobin: 12.5 g/dL (ref 12.0–15.0)
MCH: 29.8 pg (ref 26.0–34.0)
MCHC: 32.9 g/dL (ref 30.0–36.0)
MCV: 90.7 fL (ref 80.0–100.0)
Platelets: 360 10*3/uL (ref 150–400)
RBC: 4.19 MIL/uL (ref 3.87–5.11)
RDW: 13.7 % (ref 11.5–15.5)
WBC: 10.6 10*3/uL — ABNORMAL HIGH (ref 4.0–10.5)
nRBC: 0 % (ref 0.0–0.2)

## 2020-11-19 ENCOUNTER — Ambulatory Visit: Payer: Managed Care, Other (non HMO)

## 2020-11-22 ENCOUNTER — Ambulatory Visit: Payer: Managed Care, Other (non HMO)

## 2020-11-23 ENCOUNTER — Ambulatory Visit: Payer: Managed Care, Other (non HMO)

## 2020-11-24 ENCOUNTER — Ambulatory Visit: Payer: Managed Care, Other (non HMO)

## 2020-11-25 ENCOUNTER — Ambulatory Visit: Payer: Managed Care, Other (non HMO)

## 2020-11-26 ENCOUNTER — Ambulatory Visit: Payer: Managed Care, Other (non HMO)

## 2020-11-29 ENCOUNTER — Ambulatory Visit
Admission: RE | Admit: 2020-11-29 | Discharge: 2020-11-29 | Disposition: A | Discharge status: Home / Self Care | Payer: Managed Care, Other (non HMO) | Source: Ambulatory Visit | Attending: Radiation Oncology | Admitting: Radiation Oncology

## 2020-11-29 DIAGNOSIS — C50212 Malignant neoplasm of upper-inner quadrant of left female breast: Secondary | ICD-10-CM | POA: Diagnosis not present

## 2020-11-30 ENCOUNTER — Ambulatory Visit
Admission: RE | Admit: 2020-11-30 | Discharge: 2020-11-30 | Disposition: A | Discharge status: Home / Self Care | Payer: Managed Care, Other (non HMO) | Source: Ambulatory Visit | Attending: Radiation Oncology | Admitting: Radiation Oncology

## 2020-11-30 DIAGNOSIS — C50212 Malignant neoplasm of upper-inner quadrant of left female breast: Secondary | ICD-10-CM | POA: Diagnosis not present

## 2020-12-01 ENCOUNTER — Ambulatory Visit
Admission: RE | Admit: 2020-12-01 | Discharge: 2020-12-01 | Disposition: A | Discharge status: Home / Self Care | Payer: Managed Care, Other (non HMO) | Source: Ambulatory Visit | Attending: Radiation Oncology | Admitting: Radiation Oncology

## 2020-12-01 DIAGNOSIS — C50212 Malignant neoplasm of upper-inner quadrant of left female breast: Secondary | ICD-10-CM | POA: Diagnosis not present

## 2020-12-02 ENCOUNTER — Inpatient Hospital Stay: Payer: Managed Care, Other (non HMO)

## 2020-12-02 ENCOUNTER — Ambulatory Visit
Admission: RE | Admit: 2020-12-02 | Discharge: 2020-12-02 | Disposition: A | Discharge status: Home / Self Care | Payer: Managed Care, Other (non HMO) | Source: Ambulatory Visit | Attending: Radiation Oncology | Admitting: Radiation Oncology

## 2020-12-02 DIAGNOSIS — C50212 Malignant neoplasm of upper-inner quadrant of left female breast: Secondary | ICD-10-CM

## 2020-12-02 LAB — CBC
HCT: 38.1 % (ref 36.0–46.0)
Hemoglobin: 12.7 g/dL (ref 12.0–15.0)
MCH: 29.7 pg (ref 26.0–34.0)
MCHC: 33.3 g/dL (ref 30.0–36.0)
MCV: 89 fL (ref 80.0–100.0)
Platelets: 355 10*3/uL (ref 150–400)
RBC: 4.28 MIL/uL (ref 3.87–5.11)
RDW: 13.2 % (ref 11.5–15.5)
WBC: 11.1 10*3/uL — ABNORMAL HIGH (ref 4.0–10.5)
nRBC: 0 % (ref 0.0–0.2)

## 2020-12-03 ENCOUNTER — Ambulatory Visit
Admission: RE | Admit: 2020-12-03 | Discharge: 2020-12-03 | Disposition: A | Discharge status: Home / Self Care | Payer: Managed Care, Other (non HMO) | Source: Ambulatory Visit | Attending: Radiation Oncology | Admitting: Radiation Oncology

## 2020-12-03 DIAGNOSIS — C50212 Malignant neoplasm of upper-inner quadrant of left female breast: Secondary | ICD-10-CM | POA: Diagnosis not present

## 2020-12-06 ENCOUNTER — Ambulatory Visit
Admission: RE | Admit: 2020-12-06 | Discharge: 2020-12-06 | Disposition: A | Discharge status: Home / Self Care | Payer: Managed Care, Other (non HMO) | Source: Ambulatory Visit | Attending: Radiation Oncology | Admitting: Radiation Oncology

## 2020-12-06 DIAGNOSIS — C50212 Malignant neoplasm of upper-inner quadrant of left female breast: Secondary | ICD-10-CM | POA: Diagnosis not present

## 2020-12-07 ENCOUNTER — Ambulatory Visit
Admission: RE | Admit: 2020-12-07 | Discharge: 2020-12-07 | Disposition: A | Discharge status: Home / Self Care | Payer: Managed Care, Other (non HMO) | Source: Ambulatory Visit | Attending: Radiation Oncology | Admitting: Radiation Oncology

## 2020-12-07 DIAGNOSIS — C50212 Malignant neoplasm of upper-inner quadrant of left female breast: Secondary | ICD-10-CM | POA: Diagnosis not present

## 2020-12-08 ENCOUNTER — Ambulatory Visit
Admission: RE | Admit: 2020-12-08 | Discharge: 2020-12-08 | Disposition: A | Discharge status: Home / Self Care | Payer: Managed Care, Other (non HMO) | Source: Ambulatory Visit | Attending: Radiation Oncology | Admitting: Radiation Oncology

## 2020-12-08 DIAGNOSIS — C50212 Malignant neoplasm of upper-inner quadrant of left female breast: Secondary | ICD-10-CM | POA: Diagnosis not present

## 2020-12-09 ENCOUNTER — Ambulatory Visit
Admission: RE | Admit: 2020-12-09 | Discharge: 2020-12-09 | Disposition: A | Discharge status: Home / Self Care | Payer: Managed Care, Other (non HMO) | Source: Ambulatory Visit | Attending: Radiation Oncology | Admitting: Radiation Oncology

## 2020-12-09 DIAGNOSIS — C50212 Malignant neoplasm of upper-inner quadrant of left female breast: Secondary | ICD-10-CM | POA: Diagnosis not present

## 2020-12-10 ENCOUNTER — Ambulatory Visit
Admission: RE | Admit: 2020-12-10 | Discharge: 2020-12-10 | Disposition: A | Discharge status: Home / Self Care | Payer: Managed Care, Other (non HMO) | Source: Ambulatory Visit | Attending: Radiation Oncology | Admitting: Radiation Oncology

## 2020-12-10 DIAGNOSIS — C50212 Malignant neoplasm of upper-inner quadrant of left female breast: Secondary | ICD-10-CM | POA: Diagnosis not present

## 2020-12-11 DIAGNOSIS — C50212 Malignant neoplasm of upper-inner quadrant of left female breast: Secondary | ICD-10-CM | POA: Diagnosis not present

## 2020-12-13 ENCOUNTER — Ambulatory Visit
Admission: RE | Admit: 2020-12-13 | Discharge: 2020-12-13 | Disposition: A | Discharge status: Home / Self Care | Payer: Managed Care, Other (non HMO) | Source: Ambulatory Visit | Attending: Radiation Oncology | Admitting: Radiation Oncology

## 2020-12-13 DIAGNOSIS — C50212 Malignant neoplasm of upper-inner quadrant of left female breast: Secondary | ICD-10-CM | POA: Diagnosis not present

## 2020-12-14 ENCOUNTER — Ambulatory Visit
Admission: RE | Admit: 2020-12-14 | Discharge: 2020-12-14 | Disposition: A | Discharge status: Home / Self Care | Payer: Managed Care, Other (non HMO) | Source: Ambulatory Visit | Attending: Radiation Oncology | Admitting: Radiation Oncology

## 2020-12-14 DIAGNOSIS — C50212 Malignant neoplasm of upper-inner quadrant of left female breast: Secondary | ICD-10-CM | POA: Diagnosis not present

## 2020-12-15 ENCOUNTER — Ambulatory Visit
Admission: RE | Admit: 2020-12-15 | Discharge: 2020-12-15 | Disposition: A | Discharge status: Home / Self Care | Payer: Managed Care, Other (non HMO) | Source: Ambulatory Visit | Attending: Radiation Oncology | Admitting: Radiation Oncology

## 2020-12-15 ENCOUNTER — Ambulatory Visit: Payer: Managed Care, Other (non HMO)

## 2020-12-15 ENCOUNTER — Inpatient Hospital Stay: Payer: Managed Care, Other (non HMO)

## 2020-12-15 DIAGNOSIS — C50212 Malignant neoplasm of upper-inner quadrant of left female breast: Secondary | ICD-10-CM

## 2020-12-15 LAB — CBC
HCT: 38.8 % (ref 36.0–46.0)
Hemoglobin: 12.5 g/dL (ref 12.0–15.0)
MCH: 29.1 pg (ref 26.0–34.0)
MCHC: 32.2 g/dL (ref 30.0–36.0)
MCV: 90.4 fL (ref 80.0–100.0)
Platelets: 363 10*3/uL (ref 150–400)
RBC: 4.29 MIL/uL (ref 3.87–5.11)
RDW: 13.8 % (ref 11.5–15.5)
WBC: 7.9 10*3/uL (ref 4.0–10.5)
nRBC: 0 % (ref 0.0–0.2)

## 2020-12-16 ENCOUNTER — Ambulatory Visit: Payer: Managed Care, Other (non HMO)

## 2020-12-16 ENCOUNTER — Ambulatory Visit
Admission: RE | Admit: 2020-12-16 | Discharge: 2020-12-16 | Disposition: A | Discharge status: Home / Self Care | Payer: Managed Care, Other (non HMO) | Source: Ambulatory Visit | Attending: Radiation Oncology | Admitting: Radiation Oncology

## 2020-12-16 DIAGNOSIS — C50212 Malignant neoplasm of upper-inner quadrant of left female breast: Secondary | ICD-10-CM | POA: Diagnosis not present

## 2020-12-17 ENCOUNTER — Ambulatory Visit: Payer: Managed Care, Other (non HMO)

## 2020-12-17 ENCOUNTER — Ambulatory Visit
Admission: RE | Admit: 2020-12-17 | Discharge: 2020-12-17 | Disposition: A | Discharge status: Home / Self Care | Payer: Managed Care, Other (non HMO) | Source: Ambulatory Visit | Attending: Radiation Oncology | Admitting: Radiation Oncology

## 2020-12-17 DIAGNOSIS — C773 Secondary and unspecified malignant neoplasm of axilla and upper limb lymph nodes: Secondary | ICD-10-CM | POA: Diagnosis not present

## 2020-12-17 DIAGNOSIS — Z170 Estrogen receptor positive status [ER+]: Secondary | ICD-10-CM | POA: Diagnosis not present

## 2020-12-17 DIAGNOSIS — C50212 Malignant neoplasm of upper-inner quadrant of left female breast: Secondary | ICD-10-CM | POA: Diagnosis present

## 2020-12-17 DIAGNOSIS — Z510 Encounter for antineoplastic radiation therapy: Secondary | ICD-10-CM | POA: Insufficient documentation

## 2020-12-18 NOTE — Progress Notes (Signed)
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  Oncotype DX score 3.  INTERVAL HISTORY: Patient returns to clinic today near the end of her XRT for further evaluation and initiation of letrozole.  She has some erythema and skin changes from her treatments, but otherwise feels well.  She has no neurologic complaints.  She denies any recent fevers or illnesses.  She has a good appetite and denies weight loss.  She has no chest pain, shortness of breath, cough, or hemoptysis.  She denies any nausea, vomiting, constipation, or diarrhea.  She has no urinary complaints.  Patient offers no further specific complaints today.  REVIEW OF SYSTEMS:   Review of Systems  Constitutional: Negative.  Negative for fever, malaise/fatigue and weight loss.  Respiratory: Negative.  Negative for cough, hemoptysis and shortness of breath.   Cardiovascular: Negative.  Negative for chest pain and leg swelling.  Gastrointestinal: Negative.  Negative for abdominal pain.  Genitourinary: Negative.  Negative for dysuria.  Musculoskeletal: Negative.  Negative for back pain.  Skin: Negative.  Negative for rash.  Neurological: Negative.  Negative for dizziness, focal weakness and weakness.  Psychiatric/Behavioral: Negative.  The patient is not nervous/anxious.    As per HPI. Otherwise, a complete review of systems is negative.  PAST MEDICAL HISTORY: Past Medical History:  Diagnosis Date   Anxiety    due to Covid    Cardiac disease    d/t family  history of cardiac issues   Depression    due to Covid    Diffuse cystic mastopathy    Family history of breast cancer    Family history of malignant neoplasm of breast    Family history of prostate cancer    Graves disease    In remission   History of kidney stones    history of stone in past   Hyperlipidemia    Hyperthyroidism    remission   PONV (postoperative nausea and vomiting)     PAST SURGICAL HISTORY: Past Surgical History:  Procedure Laterality Date   BREAST BIOPSY Right 2008   benign   BREAST BIOPSY Left 2/13    aspiration cyst   BREAST BIOPSY Left 01/2020   Sclerosing intraductal papilloma with apocrine metaplasia. No atypia or malignancy.   BREAST BIOPSY Left 07/15/2020   [place] bx, heart marker, path pending   BREAST DUCTAL SYSTEM EXCISION Left 08/09/2020   Procedure: EXCISION DUCTAL SYSTEM BREAST, left intraductal papilloma;  Surgeon: [name], MD;  Location: [facility];  Service: General;  Laterality: Left;   BREAST LUMPECTOMY Left 09/13/2020   Procedure: BREAST LUMPECTOMY, re-excision;  Surgeon: [name], MD;  Location: [facility];  Service: General;  Laterality: Left;   BREAST LUMPECTOMY Left 10/08/2020   Procedure: BREAST LUMPECTOMY, re-excision;  Surgeon: [name], MD;  Location: [facility];  Service: General;  Laterality: Left;   BREAST LUMPECTOMY,RADIO FREQ [place] BIOPSY Left 08/09/2020   Procedure: BREAST [place] LYMPH NODE BIOPSY;  Surgeon: [name], MD;  Location: [facility];  Service: General;  Laterality: Left;   COLONOSCOPY WITH  PROPOFOL N/A 07/12/2020   Procedure: COLONOSCOPY WITH PROPOFOL;  Surgeon: Lucilla Lame, MD;  Location: [facility];  Service: Endoscopy;  Laterality: N/A;  PRIORITY [place] OF UTERUS  2011   eyelid surgery      ORBITAL RECONSTRUCTION Left 2008   graves disease caused "bug eyed"   POLYPECTOMY  07/12/2020    Procedure: POLYPECTOMY;  Surgeon: Lucilla Lame, MD;  Location: [place];  Service: Endoscopy;;   WISDOM TOOTH EXTRACTION      FAMILY HISTORY: Family History  Problem Relation Age of Onset   Diabetes Mother    Hypertension Mother    Hyperlipidemia Mother    Hypertension Father    Hyperlipidemia Father    Hypothyroidism Father    Prostate cancer Father 90   Hyperlipidemia Sister    Hypertension Sister    Hyperlipidemia Sister    Hypertension Sister    Diabetes Sister    Hyperlipidemia Sister    Hypertension Sister    Breast cancer Maternal Grandmother 49    ADVANCED DIRECTIVES (Y/N):  N  HEALTH MAINTENANCE: Social History   Tobacco Use   Smoking status: Never   Smokeless tobacco: Never  Vaping Use   Vaping Use: Never used  Substance Use Topics   Alcohol use: Yes    Comment: very rarely (maybe once a year)   Drug use: No     Colonoscopy:  PAP:  Bone density:  Lipid panel:  Allergies  Allergen Reactions   Polytrim [Polymyxin B-Trimethoprim] Rash    Was used on eyes after surgery which gave her a rash     Current Outpatient Medications  Medication Sig Dispense Refill   acetaminophen (TYLENOL) 650 MG CR tablet Take 1,300 mg by mouth every 8 (eight) hours as needed for pain.     Artificial Tear Solution (TEARS NATURALE OP) Place 1 drop into both eyes daily.     Calcium Carbonate-Vitamin D (CALCIUM 600+D PO) Take 2 tablets by mouth daily.     CRANBERRY PO Take 2 tablets by mouth daily.     FIBER PO Take 1 capsule by mouth daily. Gummy     fish oil-omega-3 fatty acids 1000 MG capsule Take 2 g by mouth daily.     Garlic Oil 6381 MG CAPS Take 1,000 mg by mouth daily.     Ibuprofen-diphenhydrAMINE HCl (ADVIL PM) 200-25 MG CAPS Take 1 tablet by mouth at bedtime as needed (sleep).     letrozole (FEMARA) 2.5 MG tablet Take 1 tablet (2.5 mg total) by mouth daily. 30 tablet 3   loratadine (CLARITIN) 10 MG tablet Take 10 mg by mouth daily as needed for  allergies.     Multiple Vitamin (MULTIVITAMIN WITH MINERALS) TABS tablet Take 1 tablet by mouth daily. Women's One A Day     vitamin B-12 (CYANOCOBALAMIN) 1000 MCG tablet Take 1,000 mcg by mouth daily.     White Petrolatum-Mineral Oil (GENTEAL TEARS NIGHT-TIME) OINT Apply 1 drop to eye at bedtime.     Ibuprofen 200 MG CAPS Take 4 capsules (800 mg total) by mouth every 6 (six) hours as needed (pain, fever, headache). (Patient taking differently: Take 400 mg by mouth every 6 (six) hours as needed (pain, fever, headache).) 120 capsule 0   No current facility-administered medications for this visit.    OBJECTIVE: Vitals:   12/21/20 1002  BP: 140/85  Pulse: 78  Resp: 18  Temp: 98.9 F (37.2 C)  SpO2: 100%     Body  mass index is 33.25 kg/m.    ECOG FS:0 - Asymptomatic  General: Well-developed, well-nourished, no acute distress. Eyes: Pink conjunctiva, anicteric sclera. HEENT: Normocephalic, moist mucous membranes. Lungs: No audible wheezing or coughing. Heart: Regular rate and rhythm. Abdomen: Soft, nontender, no obvious distention. Musculoskeletal: No edema, cyanosis, or clubbing. Neuro: Alert, answering all questions appropriately. Cranial nerves grossly intact. Skin: No rashes or petechiae noted. Psych: Normal affect.  LAB RESULTS:  Lab Results  Component Value Date   NA 138 05/18/2020   K 4.3 05/18/2020   CL 101 05/18/2020   CO2 22 05/18/2020   GLUCOSE 100 (H) 05/18/2020   BUN 13 05/18/2020   CREATININE 0.55 (L) 05/18/2020   CALCIUM 9.4 05/18/2020   PROT 7.2 05/18/2020   ALBUMIN 4.8 05/18/2020   AST 14 05/18/2020   ALT 23 05/18/2020   ALKPHOS 124 (H) 05/18/2020   BILITOT 0.4 05/18/2020   GFRNONAA 110 05/18/2020   GFRAA 126 05/18/2020    Lab Results  Component Value Date   WBC 7.9 12/15/2020   NEUTROABS 9.0 (H) 05/18/2020   HGB 12.5 12/15/2020   HCT 38.8 12/15/2020   MCV 90.4 12/15/2020   PLT 363 12/15/2020     STUDIES: No results found.  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  Oncotype DX score 3.   PLAN:    1. 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  Despite reexcision on September 13, 2020, patient was noted to have a persistent 1 mm positive margin plan is to go back for a third excision.  Her Oncotype score was 3 conferring low risk, therefore chemotherapy was not necessary despite her 3+ lymph nodes.  Patient is currently receiving XRT and will complete treatment on December 31, 2020.  She has given a prescription for letrozole today which she will take for 5 years completing in July 2027.  We will get a baseline bone mineral density in the next several weeks.  Return to clinic in 3 months routine evaluation.   2.  Genetic testing: Negative.  I spent a total of 30 minutes reviewing chart data, face-to-face evaluation with the patient, counseling and coordination of care as detailed above.    Patient expressed understanding and was in agreement with this plan. She also understands that She can call clinic at any time with any questions, concerns, or complaints.   Cancer Staging 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, left [facility]) Staging form: Breast, AJCC 8th Edition - Pathologic stage from 08/25/2020: Stage IA (pT1c, pN1a, cM0, G1, ER+, PR+, HER2-) - Signed by Lloyd Huger, MD on 08/25/2020 Stage prefix: Initial diagnosis Histologic grading system: 3 grade system   Lloyd Huger, MD   12/21/2020 10:53 AM

## 2020-12-21 ENCOUNTER — Inpatient Hospital Stay: Payer: Managed Care, Other (non HMO) | Attending: Oncology | Admitting: Oncology

## 2020-12-21 ENCOUNTER — Ambulatory Visit: Payer: Managed Care, Other (non HMO)

## 2020-12-21 ENCOUNTER — Ambulatory Visit
Admission: RE | Admit: 2020-12-21 | Discharge: 2020-12-21 | Disposition: A | Discharge status: Home / Self Care | Payer: Managed Care, Other (non HMO) | Source: Ambulatory Visit | Attending: Radiation Oncology | Admitting: Radiation Oncology

## 2020-12-21 ENCOUNTER — Encounter: Payer: Self-pay | Admitting: Oncology

## 2020-12-21 VITALS — BP 140/85 | HR 78 | Temp 98.9°F | Resp 18 | Wt 193.7 lb

## 2020-12-21 DIAGNOSIS — Z510 Encounter for antineoplastic radiation therapy: Secondary | ICD-10-CM | POA: Diagnosis not present

## 2020-12-21 DIAGNOSIS — C50212 Malignant neoplasm of upper-inner quadrant of left female breast: Secondary | ICD-10-CM | POA: Diagnosis not present

## 2020-12-21 DIAGNOSIS — C773 Secondary and unspecified malignant neoplasm of axilla and upper limb lymph nodes: Secondary | ICD-10-CM | POA: Insufficient documentation

## 2020-12-21 DIAGNOSIS — Z170 Estrogen receptor positive status [ER+]: Secondary | ICD-10-CM | POA: Insufficient documentation

## 2020-12-21 MED ORDER — LETROZOLE 2.5 MG PO TABS: 2.5000 mg | ORAL_TABLET | Freq: Every day | ORAL | 3 refills | Status: DC

## 2020-12-22 ENCOUNTER — Ambulatory Visit: Payer: Managed Care, Other (non HMO)

## 2020-12-22 ENCOUNTER — Ambulatory Visit
Admission: RE | Admit: 2020-12-22 | Discharge: 2020-12-22 | Disposition: A | Discharge status: Home / Self Care | Payer: Managed Care, Other (non HMO) | Source: Ambulatory Visit | Attending: Radiation Oncology | Admitting: Radiation Oncology

## 2020-12-22 DIAGNOSIS — Z510 Encounter for antineoplastic radiation therapy: Secondary | ICD-10-CM | POA: Diagnosis not present

## 2020-12-23 ENCOUNTER — Ambulatory Visit: Payer: Managed Care, Other (non HMO)

## 2020-12-23 ENCOUNTER — Ambulatory Visit
Admission: RE | Admit: 2020-12-23 | Discharge: 2020-12-23 | Disposition: A | Discharge status: Home / Self Care | Payer: Managed Care, Other (non HMO) | Source: Ambulatory Visit | Attending: Radiation Oncology | Admitting: Radiation Oncology

## 2020-12-23 DIAGNOSIS — Z510 Encounter for antineoplastic radiation therapy: Secondary | ICD-10-CM | POA: Diagnosis not present

## 2020-12-24 ENCOUNTER — Ambulatory Visit: Payer: Managed Care, Other (non HMO)

## 2020-12-24 ENCOUNTER — Ambulatory Visit
Admission: RE | Admit: 2020-12-24 | Discharge: 2020-12-24 | Disposition: A | Discharge status: Home / Self Care | Payer: Managed Care, Other (non HMO) | Source: Ambulatory Visit | Attending: Radiation Oncology | Admitting: Radiation Oncology

## 2020-12-24 DIAGNOSIS — Z510 Encounter for antineoplastic radiation therapy: Secondary | ICD-10-CM | POA: Diagnosis not present

## 2020-12-27 ENCOUNTER — Ambulatory Visit: Payer: Managed Care, Other (non HMO)

## 2020-12-27 ENCOUNTER — Ambulatory Visit
Admission: RE | Admit: 2020-12-27 | Discharge: 2020-12-27 | Disposition: A | Discharge status: Home / Self Care | Payer: Managed Care, Other (non HMO) | Source: Ambulatory Visit | Attending: Radiation Oncology | Admitting: Radiation Oncology

## 2020-12-27 DIAGNOSIS — Z510 Encounter for antineoplastic radiation therapy: Secondary | ICD-10-CM | POA: Diagnosis not present

## 2020-12-28 ENCOUNTER — Ambulatory Visit: Payer: Managed Care, Other (non HMO)

## 2020-12-28 ENCOUNTER — Ambulatory Visit
Admission: RE | Admit: 2020-12-28 | Discharge: 2020-12-28 | Disposition: A | Discharge status: Home / Self Care | Payer: Managed Care, Other (non HMO) | Source: Ambulatory Visit | Attending: Radiation Oncology | Admitting: Radiation Oncology

## 2020-12-28 DIAGNOSIS — Z510 Encounter for antineoplastic radiation therapy: Secondary | ICD-10-CM | POA: Diagnosis not present

## 2020-12-29 ENCOUNTER — Ambulatory Visit
Admission: RE | Admit: 2020-12-29 | Discharge: 2020-12-29 | Disposition: A | Discharge status: Home / Self Care | Payer: Managed Care, Other (non HMO) | Source: Ambulatory Visit | Attending: Radiation Oncology | Admitting: Radiation Oncology

## 2020-12-29 DIAGNOSIS — Z510 Encounter for antineoplastic radiation therapy: Secondary | ICD-10-CM | POA: Diagnosis not present

## 2020-12-30 ENCOUNTER — Ambulatory Visit
Admission: RE | Admit: 2020-12-30 | Discharge: 2020-12-30 | Disposition: A | Discharge status: Home / Self Care | Payer: Managed Care, Other (non HMO) | Source: Ambulatory Visit | Attending: Radiation Oncology | Admitting: Radiation Oncology

## 2020-12-30 DIAGNOSIS — Z510 Encounter for antineoplastic radiation therapy: Secondary | ICD-10-CM | POA: Diagnosis not present

## 2020-12-31 ENCOUNTER — Ambulatory Visit: Payer: Managed Care, Other (non HMO)

## 2020-12-31 ENCOUNTER — Ambulatory Visit
Admission: RE | Admit: 2020-12-31 | Discharge: 2020-12-31 | Disposition: A | Discharge status: Home / Self Care | Payer: Managed Care, Other (non HMO) | Source: Ambulatory Visit | Attending: Radiation Oncology | Admitting: Radiation Oncology

## 2020-12-31 DIAGNOSIS — Z510 Encounter for antineoplastic radiation therapy: Secondary | ICD-10-CM | POA: Diagnosis not present

## 2021-01-03 ENCOUNTER — Ambulatory Visit: Payer: Managed Care, Other (non HMO)

## 2021-01-05 ENCOUNTER — Other Ambulatory Visit: Payer: Self-pay

## 2021-01-05 ENCOUNTER — Ambulatory Visit
Admission: RE | Admit: 2021-01-05 | Discharge: 2021-01-05 | Disposition: A | Discharge status: Home / Self Care | Payer: Managed Care, Other (non HMO) | Source: Ambulatory Visit | Attending: Oncology | Admitting: Oncology

## 2021-01-05 DIAGNOSIS — C50212 Malignant neoplasm of upper-inner quadrant of left female breast: Secondary | ICD-10-CM | POA: Diagnosis not present

## 2021-02-07 ENCOUNTER — Encounter: Payer: Self-pay | Admitting: Radiation Oncology

## 2021-02-07 ENCOUNTER — Ambulatory Visit
Admission: RE | Admit: 2021-02-07 | Discharge: 2021-02-07 | Disposition: A | Discharge status: Home / Self Care | Payer: Managed Care, Other (non HMO) | Source: Ambulatory Visit | Attending: Radiation Oncology | Admitting: Radiation Oncology

## 2021-02-07 VITALS — BP 144/87 | HR 86 | Temp 99.0°F | Resp 16 | Wt 194.5 lb

## 2021-02-07 DIAGNOSIS — Z923 Personal history of irradiation: Secondary | ICD-10-CM | POA: Insufficient documentation

## 2021-02-07 DIAGNOSIS — Z170 Estrogen receptor positive status [ER+]: Secondary | ICD-10-CM | POA: Insufficient documentation

## 2021-02-07 DIAGNOSIS — C50212 Malignant neoplasm of upper-inner quadrant of left female breast: Secondary | ICD-10-CM | POA: Diagnosis not present

## 2021-02-07 DIAGNOSIS — Z79811 Long term (current) use of aromatase inhibitors: Secondary | ICD-10-CM | POA: Insufficient documentation

## 2021-02-07 NOTE — Progress Notes (Signed)
Radiation Oncology Follow up Note  Name: Beth [name]   Date:   02/07/2021 MRN:  [id number] DOB: 04/16/1971    This 50 y.o. 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  She is seen today in routine follow-up is doing well.  There is still some firmness of her breast.  Although she is without complaints.  She may have still present a seroma although not clinically significant.  She specifically denies breast tenderness cough or bone pain..  She is currently on Femara tolerating it well without side effect.  COMPLICATIONS OF TREATMENT: none  FOLLOW UP COMPLIANCE: keeps appointments   PHYSICAL EXAM:  BP (!) 144/87 (BP Location: Right Arm, Patient Position: Sitting)   Pulse 86   Temp 99 F (37.2 C) (Tympanic)   Resp 16   Wt 194 lb 8 oz (88.2 kg)   BMI 33.39 kg/m  Left breast is little more firm.  Possibly seroma still present although much decreased in size.  No dominant masses noted in either breast.  No axillary or supraclavicular adenopathy is appreciated.  Well-developed well-nourished patient in NAD. HEENT reveals PERLA, EOMI, discs not visualized.  Oral cavity is clear. No oral mucosal lesions are identified. Neck is clear without evidence of cervical or supraclavicular adenopathy. Lungs are clear to A&P. Cardiac examination is essentially unremarkable with regular rate and rhythm without murmur rub or thrill. Abdomen is benign with no organomegaly or masses noted. Motor sensory and DTR levels are equal and symmetric in the upper and lower extremities. Cranial nerves II  through XII are grossly intact. Proprioception is intact. No peripheral adenopathy or edema is identified. No motor or sensory levels are noted. Crude visual fields are within normal range.  RADIOLOGY RESULTS: No current films for review  PLAN: Present time patient is doing well 1 month out from whole breast radiation.  And pleased with her overall progress.  I have asked to see her back in 4 to 5 months for follow-up.  Patient knows to call with any concerns.  I would like to take this opportunity to thank you for allowing me to participate in the care of your patient.Noreene Filbert, MD

## 2021-02-26 IMAGING — MG MM DIGITAL DIAGNOSTIC UNILAT*L* W/ TOMO W/ CAD
6 of 12 series · 6 of 36 positions shown · non-contrast
Comparison: Previous exam(s).

CLINICAL DATA: The patient presented with nipple inversion January 09, 2020. An intraductal mass was identified and biopsied demonstrating
a papilloma. A six-month follow-up was recommended. The patient has
a known mass at approximately 6 o'clock in the left breast which is
been stable for many years and presumed benign.

EXAM:
DIGITAL DIAGNOSTIC BILATERAL MAMMOGRAM WITH CAD AND TOMOSYNTHESIS
ULTRASOUND LEFT BREAST
TECHNIQUE: Left digital diagnostic mammography and breast tomosynthesis was
performed. Digital images of the breasts were evaluated with
computer-aided detection. Targeted ultrasound examination of the
Left breast was performed.

[L MLO synth-2D (1 of 2)]
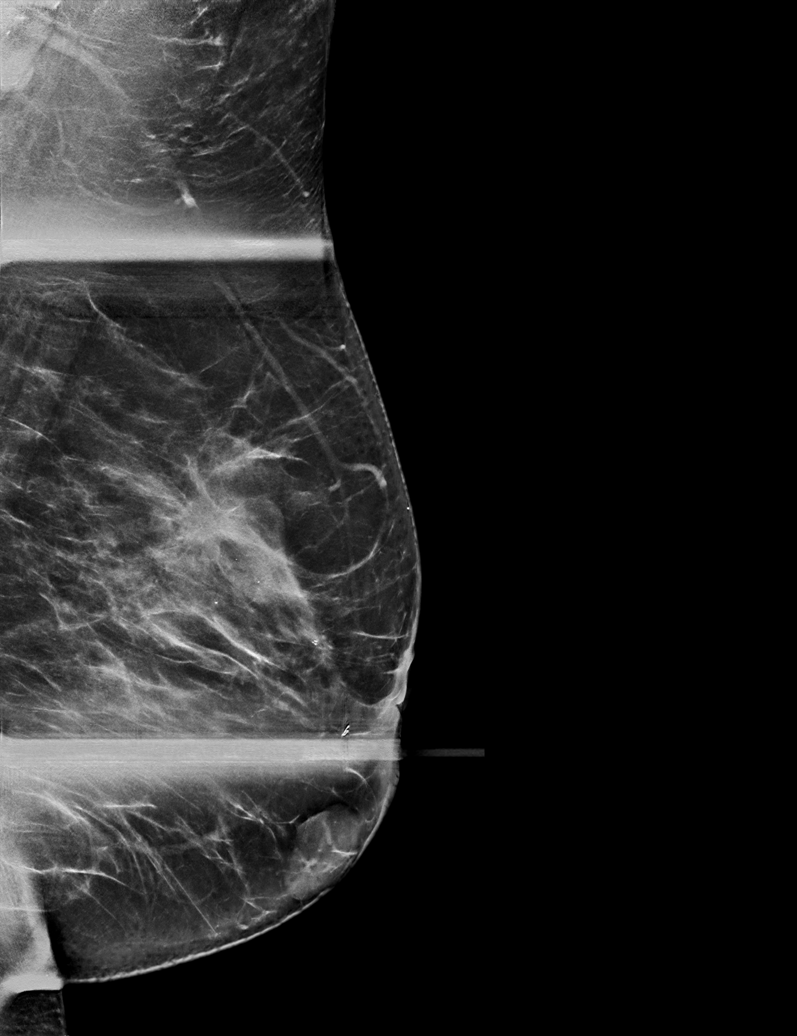

[L CC synth-2D (1 of 3)]
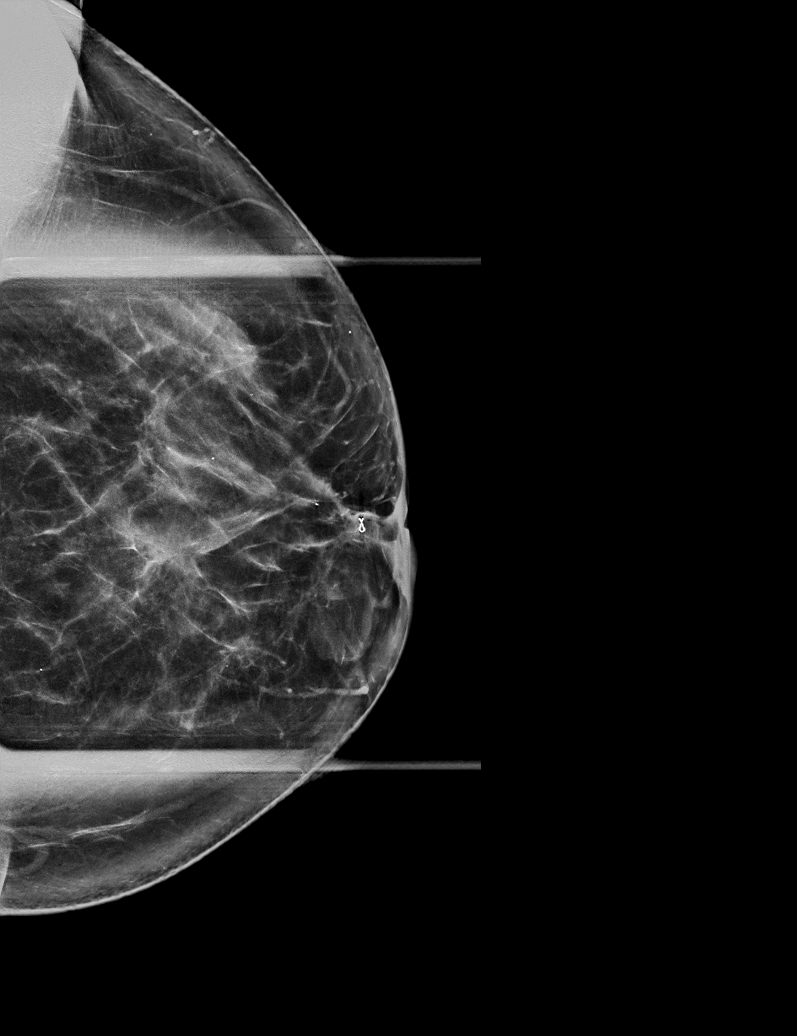

[L ML synth-2D]
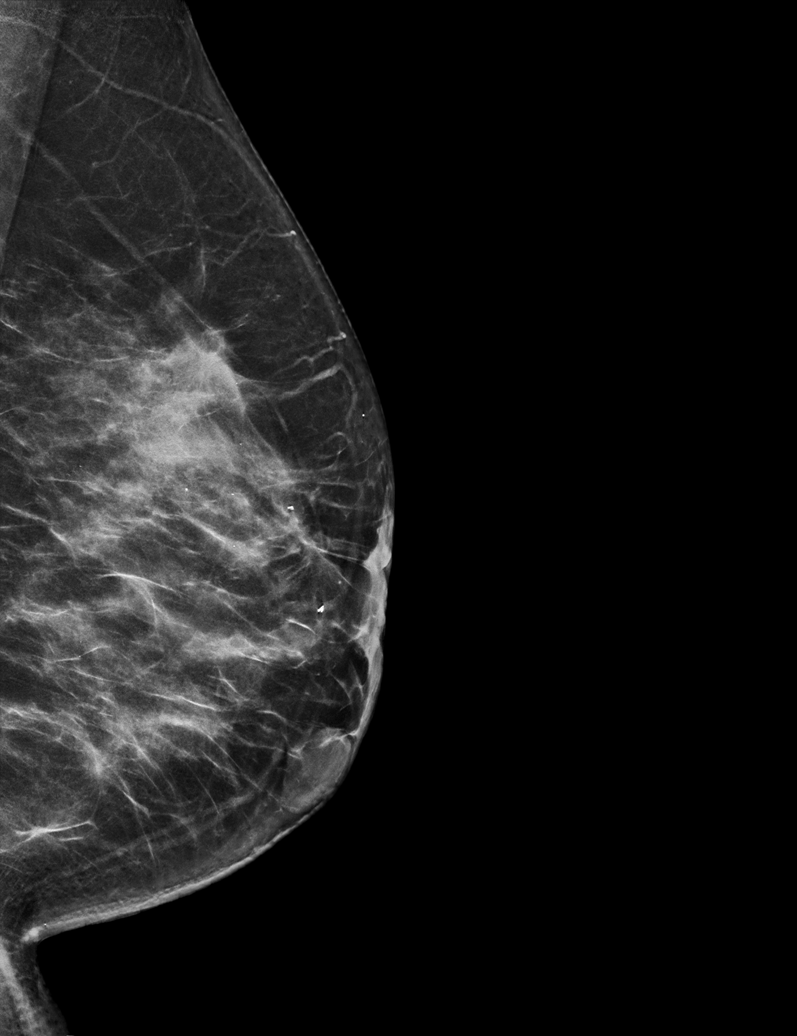

[L CC synth-2D (2 of 3)]
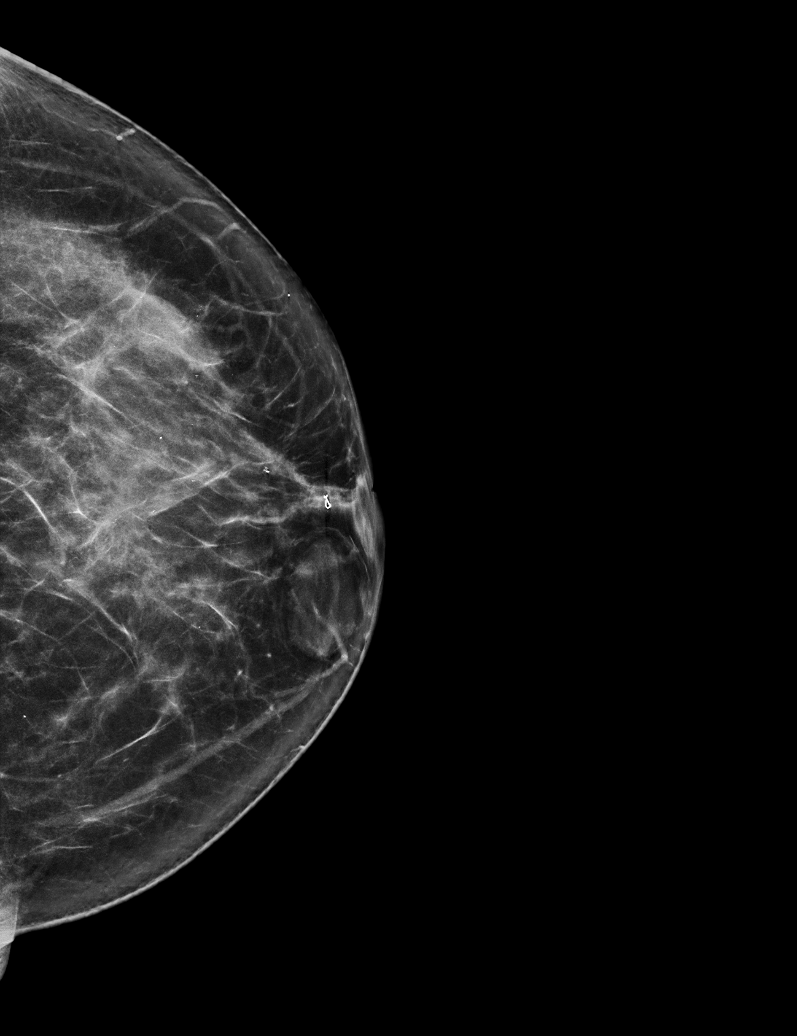

[L CC synth-2D (3 of 3)]
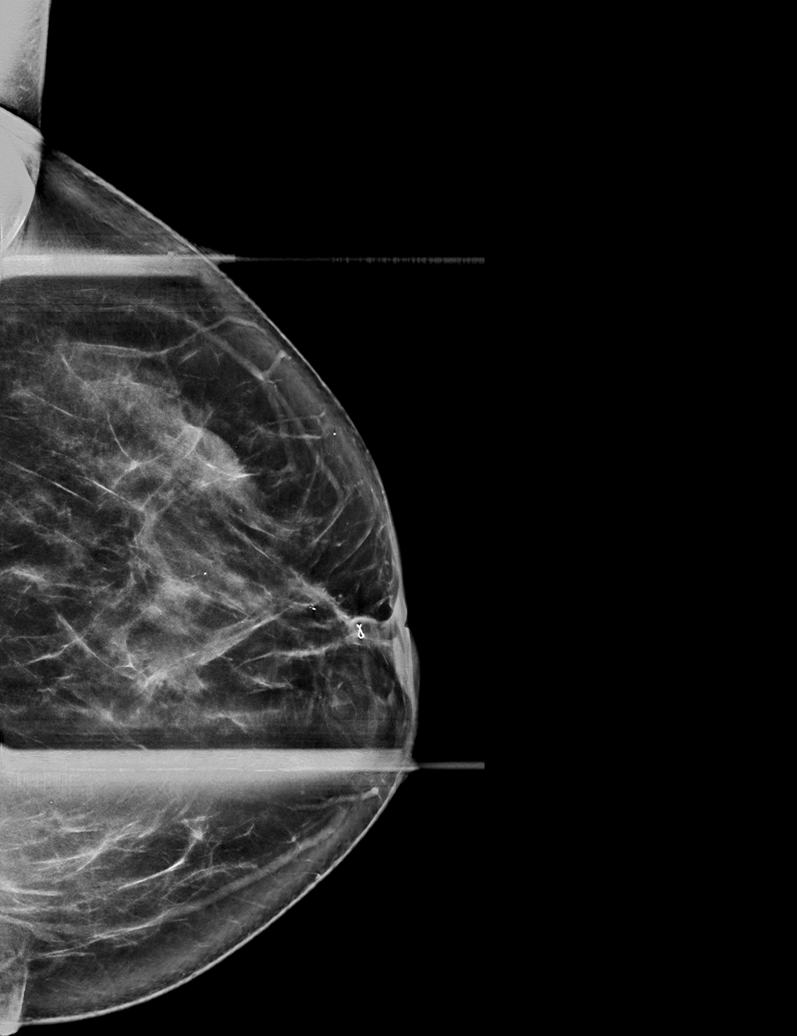

[L MLO synth-2D (2 of 2)]
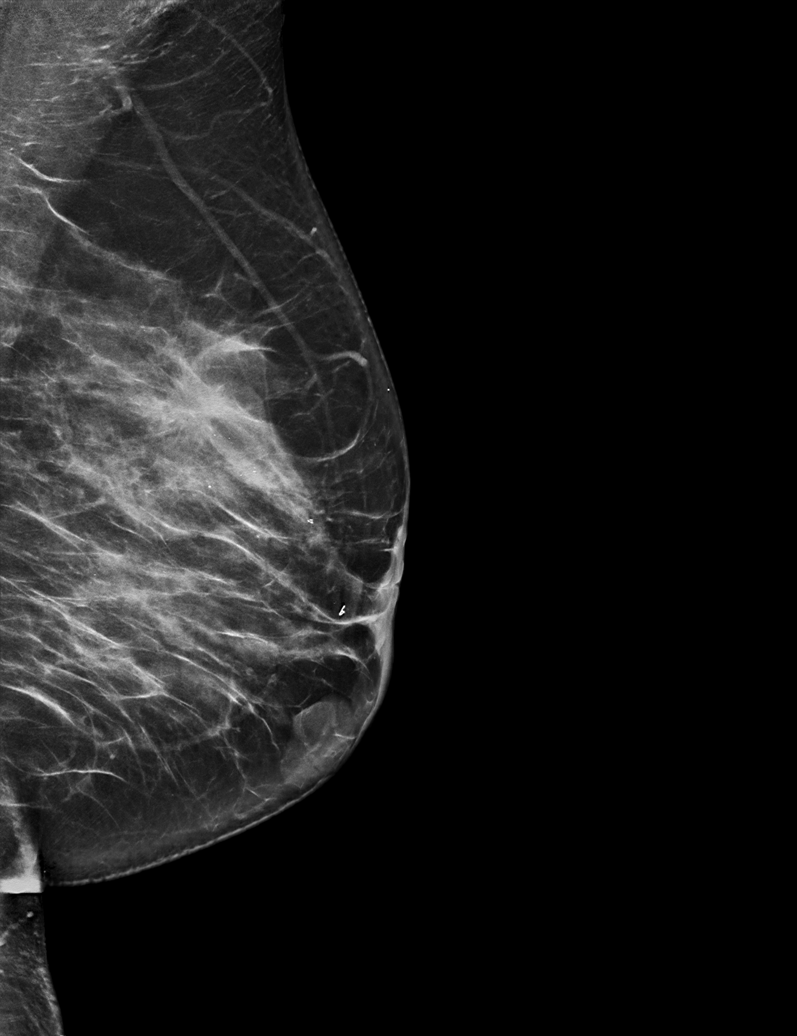

[6 of 36 positions shown; findings below may reference images not displayed]

ACR Breast Density Category c: The breast tissue is heterogeneously
dense, which may obscure small masses.
FINDINGS: There is an apparent obscured mass in the lateral left breast. There
is distortion between 11 o'clock and 1 o'clock in the superior left
breast at a mid depth extending towards the nipple. The biopsy clip
is located within a retroareolar mass which is stable. The known
stable 6 o'clock mass is unchanged on the left

Targeted ultrasound is performed, showing a minimally complicated
cyst in the lateral left breast at [DATE], 4 cm from the nipple
accounting for the obscured mass. The known previously biopsied
intraductal papilloma measures 7 x 9 x 5 mm, unchanged. There is an
irregular mass with shadowing in the left breast at [DATE], 4 cm from
the nipple measures 8 x 11 by 13 mm, likely correlating with the
known distortion and resulting nipple inversion. No axillary
adenopathy.
IMPRESSION: There is a suspicious mass resulting in left breast distortion at
[DATE], 4 cm from the nipple. I suspect the sonographically measured
size of 8 x 11 x 13 mm may not accurately represent the true size of
the abnormality. The intraductal papilloma measures a little smaller
in the interval, likely due to interval biopsy. No other suspicious
findings.

RECOMMENDATION:
Recommend ultrasound-guided biopsy of the irregular mass in the left
breast at [DATE]. Recommend clip placement with follow-up mammography
to ensure this mass correlates with the left breast distortion. If
it does not, recommend stereotactic biopsy of the distortion. If
malignancy is identified as suspected, recommend MRI prior to
surgery if breast conservation is being considered. If the patient
proceeds to surgery for the irregular mass, recommend surgical
excision of the intraductal papilloma.

I have discussed the findings and recommendations with the patient.
If applicable, a reminder letter will be sent to the patient
regarding the next appointment.

BI-RADS CATEGORY  5: Highly suggestive of malignancy.

## 2021-03-21 NOTE — Progress Notes (Signed)
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  Oncotype DX score 3.  INTERVAL HISTORY: Patient returns to clinic today for routine 86-month evaluation and to assess her toleration of letrozole.  She currently feels well and is asymptomatic.  She is tolerating letrozole without significant side effects. She has no neurologic complaints.  She denies any recent fevers or illnesses.  She has a good appetite and denies weight loss.  She has no chest pain, shortness of breath, cough, or hemoptysis.  She denies any nausea, vomiting, constipation, or diarrhea.  She has no urinary complaints.  Patient offers no specific complaints today.  REVIEW OF SYSTEMS:   Review of Systems  Constitutional: Negative.  Negative for fever, malaise/fatigue and weight loss.  Respiratory: Negative.  Negative for cough, hemoptysis and shortness of breath.   Cardiovascular: Negative.  Negative for chest pain and leg swelling.  Gastrointestinal: Negative.  Negative for abdominal pain.  Genitourinary: Negative.  Negative for dysuria.  Musculoskeletal: Negative.  Negative for back pain.  Skin: Negative.  Negative for rash.  Neurological: Negative.  Negative for dizziness, focal weakness and weakness.  Psychiatric/Behavioral: Negative.  The patient is not  nervous/anxious.    As per HPI. Otherwise, a complete review of systems is negative.  PAST MEDICAL HISTORY: Past Medical History:  Diagnosis Date   Anxiety    due to Covid    Cardiac disease    d/t family history of cardiac issues   Depression    due to Covid    Diffuse cystic mastopathy    Family history of breast cancer    Family history of malignant neoplasm of breast    Family history of prostate cancer    Graves disease    In remission   History of kidney stones    history of stone in past   Hyperlipidemia    Hyperthyroidism    remission   PONV (postoperative nausea and vomiting)     PAST SURGICAL HISTORY: Past Surgical History:  Procedure Laterality Date   BREAST BIOPSY Right 2008   benign   BREAST BIOPSY Left 2/13    aspiration cyst   BREAST BIOPSY Left 01/2020   Sclerosing intraductal papilloma with apocrine metaplasia. No atypia or malignancy.   BREAST BIOPSY Left 07/15/2020   [place] bx, heart marker, path pending   BREAST DUCTAL SYSTEM EXCISION Left 08/09/2020   Procedure: EXCISION DUCTAL SYSTEM BREAST, left intraductal papilloma;  Surgeon: [name], MD;  Location: [facility];  Service: General;  Laterality: Left;   BREAST LUMPECTOMY Left 09/13/2020   Procedure: BREAST LUMPECTOMY, re-excision;  Surgeon: [name], MD;  Location: [facility];  Service: General;  Laterality: Left;   BREAST LUMPECTOMY Left 10/08/2020   Procedure: BREAST LUMPECTOMY, re-excision;  Surgeon: [name], MD;  Location: [facility];  Service: General;  Laterality: Left;   BREAST LUMPECTOMY,RADIO FREQ [place] BIOPSY Left 08/09/2020   Procedure: [place]  LYMPH NODE BIOPSY;  Surgeon: [name], MD;  Location: [facility];  Service: General;  Laterality: Left;   COLONOSCOPY WITH PROPOFOL N/A 07/12/2020   Procedure: COLONOSCOPY WITH PROPOFOL;  Surgeon: Lucilla Lame, MD;  Location: [place];   Service: Endoscopy;  Laterality: N/A;  PRIORITY [place] OF UTERUS  2011   eyelid surgery      ORBITAL RECONSTRUCTION Left 2008   graves disease caused "bug eyed"   POLYPECTOMY  07/12/2020   Procedure: POLYPECTOMY;  Surgeon: Lucilla Lame, MD;  Location: [place];  Service: Endoscopy;;   WISDOM TOOTH EXTRACTION      FAMILY HISTORY: Family History  Problem Relation Age of Onset   Diabetes Mother    Hypertension Mother    Hyperlipidemia Mother    Hypertension Father    Hyperlipidemia Father    Hypothyroidism Father    Prostate cancer Father 68   Hyperlipidemia Sister    Hypertension Sister    Hyperlipidemia Sister    Hypertension Sister    Diabetes Sister    Hyperlipidemia Sister    Hypertension Sister    Breast cancer Maternal Grandmother 7    ADVANCED DIRECTIVES (Y/N):  N  HEALTH MAINTENANCE: Social History   Tobacco Use   Smoking status: Never   Smokeless tobacco: Never  Vaping Use   Vaping Use: Never used  Substance Use Topics   Alcohol use: Yes    Comment: very rarely (maybe once a year)   Drug use: No     Colonoscopy:  PAP:  Bone density:  Lipid panel:  Allergies  Allergen Reactions   Polytrim [Polymyxin B-Trimethoprim] Rash    Was used on eyes after surgery which gave her a rash     Current Outpatient Medications  Medication Sig Dispense Refill   acetaminophen (TYLENOL) 650 MG CR tablet Take 1,300 mg by mouth every 8 (eight) hours as needed for pain.     Artificial Tear Solution (TEARS NATURALE OP) Place 1 drop into both eyes daily.     Calcium Carbonate-Vitamin D (CALCIUM 600+D PO) Take 2 tablets by mouth daily.     CRANBERRY PO Take 2 tablets by mouth daily.     FIBER PO Take 1 capsule by mouth daily. Gummy     fish oil-omega-3 fatty acids 1000 MG capsule Take 2 g by mouth daily.     Garlic Oil 4665 MG CAPS Take 1,000 mg by mouth daily.     Ibuprofen 200 MG CAPS Take 4 capsules (800 mg total) by mouth every 6 (six)  hours as needed (pain, fever, headache). (Patient taking differently: Take 400 mg by mouth every 6 (six) hours as needed (pain, fever, headache).) 120 capsule 0   Ibuprofen-diphenhydrAMINE HCl (ADVIL PM) 200-25 MG CAPS Take 1 tablet by mouth at bedtime as needed (sleep).     letrozole (FEMARA) 2.5 MG tablet Take 1 tablet (2.5 mg total) by mouth daily. 30 tablet 3   loratadine (CLARITIN) 10 MG tablet Take 10 mg by mouth daily as needed for allergies.     Multiple Vitamin (MULTIVITAMIN WITH MINERALS) TABS tablet Take 1 tablet by mouth daily. Women's One A Day     vitamin B-12 (CYANOCOBALAMIN) 1000 MCG tablet Take 1,000 mcg by mouth daily.     White Petrolatum-Mineral Oil (GENTEAL TEARS NIGHT-TIME) OINT Apply 1 drop to eye at bedtime.     No current facility-administered medications for this visit.    OBJECTIVE: There were no vitals filed for  this visit.    There is no height or weight on file to calculate BMI.    ECOG FS:0 - Asymptomatic  General: Well-developed, well-nourished, no acute distress. Eyes: Pink conjunctiva, anicteric sclera. HEENT: Normocephalic, moist mucous membranes. Breasts: Exam deferred today.  Patient reports her nipple remains inverted. Lungs: No audible wheezing or coughing. Heart: Regular rate and rhythm. Abdomen: Soft, nontender, no obvious distention. Musculoskeletal: No edema, cyanosis, or clubbing. Neuro: Alert, answering all questions appropriately. Cranial nerves grossly intact. Skin: No rashes or petechiae noted. Psych: Normal affect.   LAB RESULTS:  Lab Results  Component Value Date   NA 138 05/18/2020   K 4.3 05/18/2020   CL 101 05/18/2020   CO2 22 05/18/2020   GLUCOSE 100 (H) 05/18/2020   BUN 13 05/18/2020   CREATININE 0.55 (L) 05/18/2020   CALCIUM 9.4 05/18/2020   PROT 7.2 05/18/2020   ALBUMIN 4.8 05/18/2020   AST 14 05/18/2020   ALT 23 05/18/2020   ALKPHOS 124 (H) 05/18/2020   BILITOT 0.4 05/18/2020   GFRNONAA 110 05/18/2020   GFRAA 126  05/18/2020    Lab Results  Component Value Date   WBC 7.9 12/15/2020   NEUTROABS 9.0 (H) 05/18/2020   HGB 12.5 12/15/2020   HCT 38.8 12/15/2020   MCV 90.4 12/15/2020   PLT 363 12/15/2020     STUDIES: No results found.  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  Oncotype DX score 3.   PLAN:    1. 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  Despite reexcision on September 13, 2020, patient was noted to have a persistent 1 mm positive margin plan is to go back for a third excision.  Her Oncotype score was 3 conferring low risk, therefore chemotherapy was not necessary despite her 3+ lymph nodes.  Patient completed adjuvant XRT in July 2022.  She was given a prescription for letrozole which she will take for a minimum of 5 years completing treatment in July 2027, but given her node positive disease, can consider extending treatment.  Patient will require repeat mammogram in February 2023.  Return to clinic in 6 months for routine evaluation.   2.  Genetic testing: Negative. 3.  Bone health: Bone mineral density on January 05, 2021 was reported as normal.  Repeat in July 2024.  Patient expressed understanding and was in agreement with this plan. She also understands that She can call clinic at any time with any questions, concerns, or complaints.   Cancer Staging 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, left [facility]) Staging form: Breast, AJCC 8th Edition - Pathologic stage from 08/25/2020: Stage IA (pT1c, pN1a, cM0, G1, ER+, PR+, HER2-) - Signed by Lloyd Huger, MD on 08/25/2020 Stage prefix: Initial diagnosis Histologic grading system: 3 grade system   Lloyd Huger, MD    03/24/2021 1:44 PM

## 2021-03-24 ENCOUNTER — Encounter: Payer: Self-pay | Admitting: General Surgery

## 2021-03-24 ENCOUNTER — Inpatient Hospital Stay: Payer: Managed Care, Other (non HMO) | Attending: Oncology | Admitting: Oncology

## 2021-03-24 DIAGNOSIS — Z8042 Family history of malignant neoplasm of prostate: Secondary | ICD-10-CM | POA: Insufficient documentation

## 2021-03-24 DIAGNOSIS — Z79899 Other long term (current) drug therapy: Secondary | ICD-10-CM | POA: Insufficient documentation

## 2021-03-24 DIAGNOSIS — Z833 Family history of diabetes mellitus: Secondary | ICD-10-CM | POA: Insufficient documentation

## 2021-03-24 DIAGNOSIS — Z923 Personal history of irradiation: Secondary | ICD-10-CM | POA: Diagnosis not present

## 2021-03-24 DIAGNOSIS — Z87442 Personal history of urinary calculi: Secondary | ICD-10-CM | POA: Insufficient documentation

## 2021-03-24 DIAGNOSIS — C50212 Malignant neoplasm of upper-inner quadrant of left female breast: Secondary | ICD-10-CM

## 2021-03-24 DIAGNOSIS — Z8249 Family history of ischemic heart disease and other diseases of the circulatory system: Secondary | ICD-10-CM | POA: Insufficient documentation

## 2021-03-24 DIAGNOSIS — Z8349 Family history of other endocrine, nutritional and metabolic diseases: Secondary | ICD-10-CM | POA: Insufficient documentation

## 2021-03-24 DIAGNOSIS — Z170 Estrogen receptor positive status [ER+]: Secondary | ICD-10-CM | POA: Insufficient documentation

## 2021-03-24 DIAGNOSIS — Z803 Family history of malignant neoplasm of breast: Secondary | ICD-10-CM | POA: Diagnosis not present

## 2021-03-24 DIAGNOSIS — Z8616 Personal history of COVID-19: Secondary | ICD-10-CM | POA: Insufficient documentation

## 2021-03-24 DIAGNOSIS — Z881 Allergy status to other antibiotic agents status: Secondary | ICD-10-CM | POA: Diagnosis not present

## 2021-03-24 NOTE — Progress Notes (Signed)
Pt/family concerned for reccurence. Pt also wandering if letrazole can cause early menopause. Wanting to know about when the next mammogram is scheduled, requesting bilateral.

## 2021-03-24 NOTE — Progress Notes (Signed)
Survivorship Care Plan visit completed.  Treatment summary reviewed and given to patient.  ASCO answers booklet reviewed and given to patient.  CARE program and Cancer Transitions discussed with patient along with other resources cancer center offers to patients and caregivers.  Patient verbalized understanding.    

## 2021-03-25 IMAGING — MG MM BREAST SURGICAL SPECIMEN
2 series · 2 of 2 positions shown · non-contrast
Comparison: Previous exam(s).
COMPARISON: Previous exam(s).

Addendum:
CLINICAL DATA: Status post surgical excision today after earlier
radiofrequency tag localization.

EXAM:
SPECIMEN RADIOGRAPH OF THE LEFT BREAST

[L (1 of 2)]
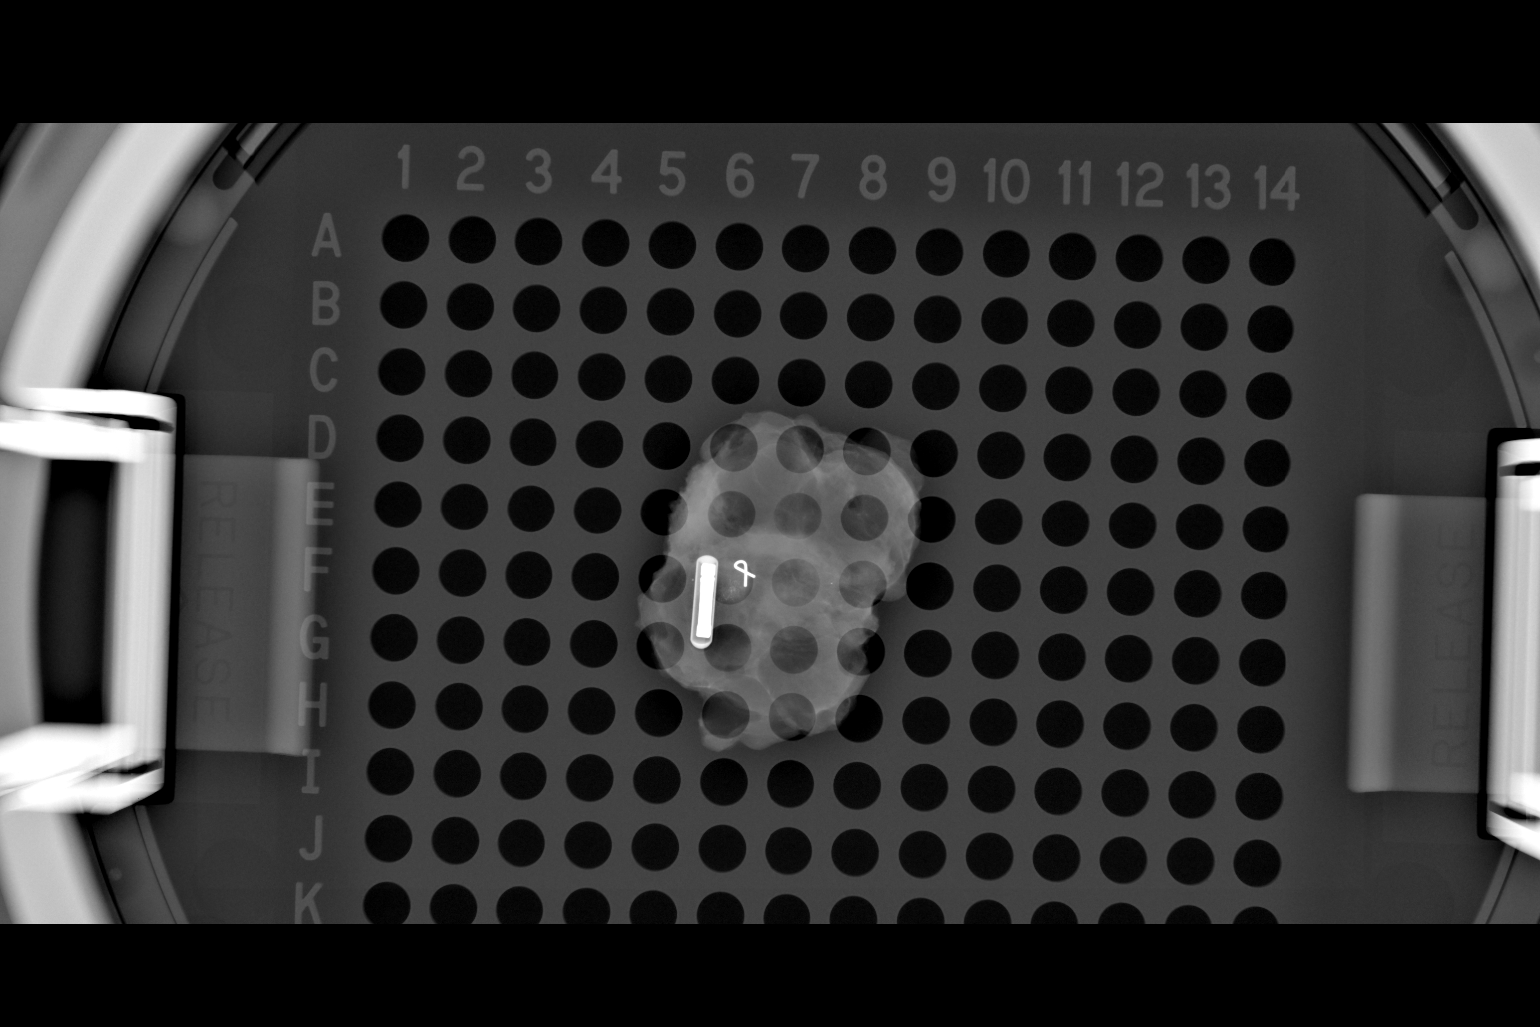

[L (2 of 2)]
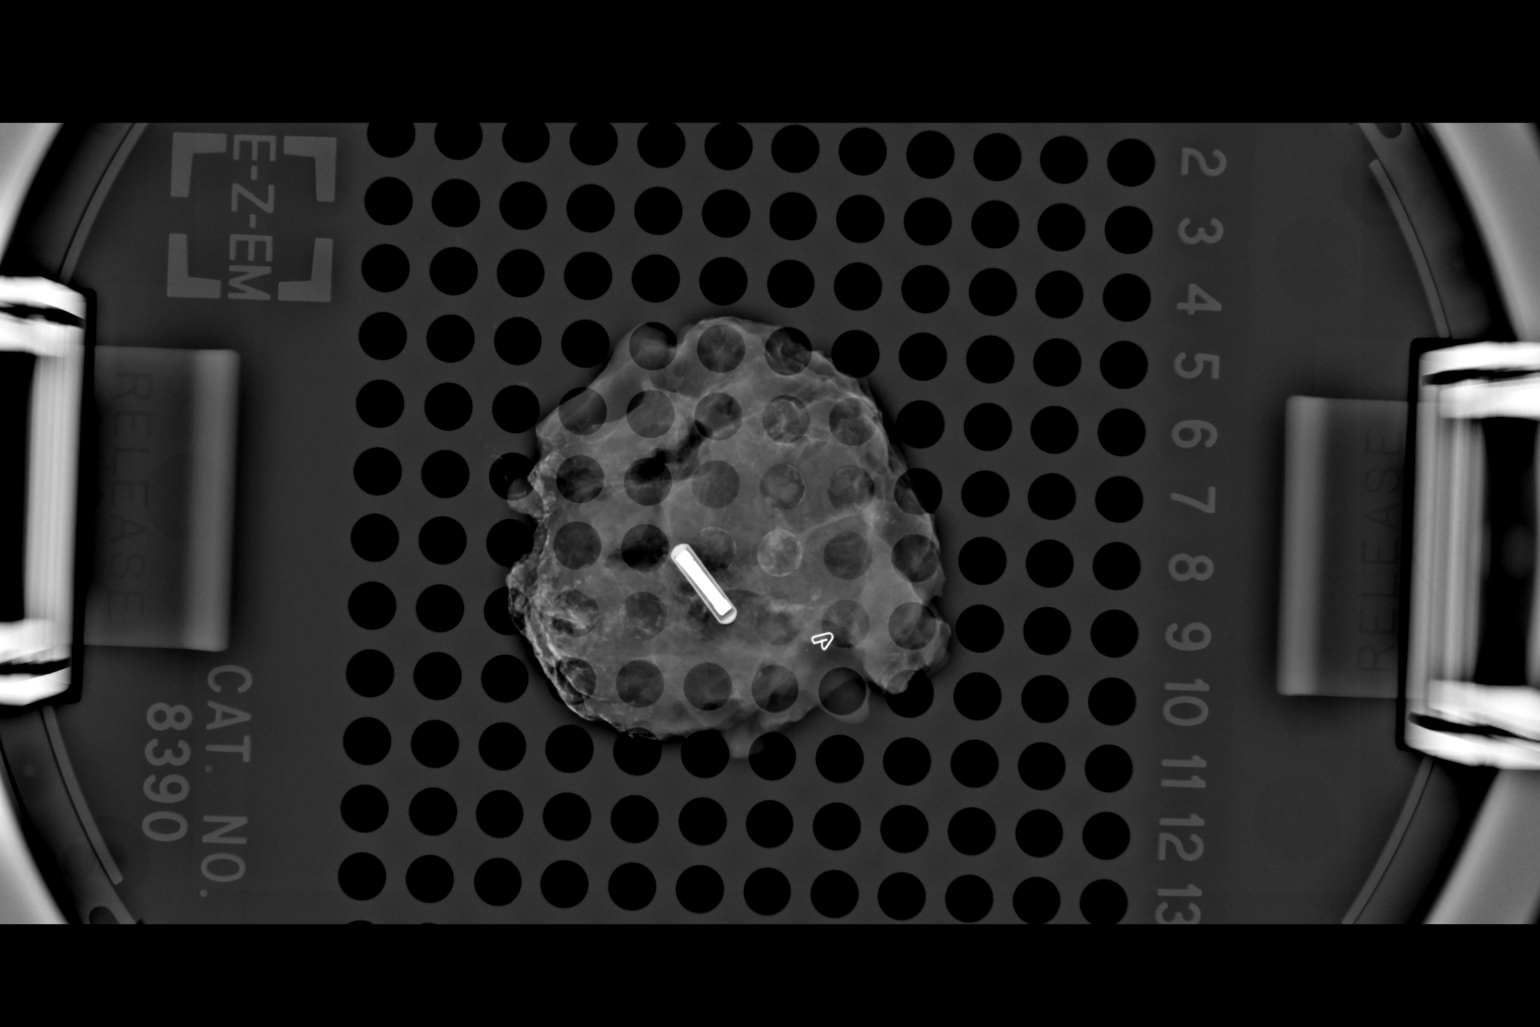

[2 of 2 positions shown; findings below may reference images not displayed]

FINDINGS: Status post excision of the LEFT breast. The radiofrequency tag and
ribbon shaped clip are present within the specimen.
IMPRESSION: Specimen radiograph of the LEFT breast.

ADDENDUM:
Additional specimen radiograph is provided containing the heart
shaped clip with associated radiofrequency tag.

*** End of Addendum ***
FINDINGS: Status post excision of the LEFT breast. The radiofrequency tag and
ribbon shaped clip are present within the specimen.
IMPRESSION: Specimen radiograph of the LEFT breast.

## 2021-04-01 ENCOUNTER — Other Ambulatory Visit: Payer: Self-pay | Admitting: Oncology

## 2021-04-07 ENCOUNTER — Ambulatory Visit (INDEPENDENT_AMBULATORY_CARE_PROVIDER_SITE_OTHER): Payer: Managed Care, Other (non HMO) | Admitting: Advanced Practice Midwife

## 2021-04-07 ENCOUNTER — Other Ambulatory Visit: Payer: Self-pay

## 2021-04-07 ENCOUNTER — Other Ambulatory Visit (HOSPITAL_COMMUNITY)
Admission: RE | Admit: 2021-04-07 | Discharge: 2021-04-07 | Disposition: A | Discharge status: Home / Self Care | Payer: Managed Care, Other (non HMO) | Source: Ambulatory Visit | Attending: Advanced Practice Midwife | Admitting: Advanced Practice Midwife

## 2021-04-07 ENCOUNTER — Encounter: Payer: Self-pay | Admitting: Advanced Practice Midwife

## 2021-04-07 VITALS — BP 138/78 | HR 73 | Ht 64.0 in | Wt 189.0 lb

## 2021-04-07 DIAGNOSIS — Z124 Encounter for screening for malignant neoplasm of cervix: Secondary | ICD-10-CM | POA: Insufficient documentation

## 2021-04-07 DIAGNOSIS — Z01419 Encounter for gynecological examination (general) (routine) without abnormal findings: Secondary | ICD-10-CM

## 2021-04-07 NOTE — Patient Instructions (Signed)
Menopause Menopause is the normal time of a woman's life when menstrual periods stop completely. It marks the natural end to a woman's ability to become pregnant. It can be defined as the absence of a menstrual period for 12 months without another medical cause. The transition to menopause (perimenopause) most often happens between the ages of 45 and 55, and can last for many years. During perimenopause, hormone levels change in your body, which can cause symptoms and affect your health. Menopause may increase your risk for: Weakened bones (osteoporosis), which causes fractures. Depression. Hardening and narrowing of the arteries (atherosclerosis), which can cause heart attacks and strokes. What are the causes? This condition is usually caused by a natural change in hormone levels that happens as you get older. The condition may also be caused by changes that are not natural, including: Surgery to remove both ovaries (surgical menopause). Side effects from some medicines, such as chemotherapy used to treat cancer (chemical menopause). What increases the risk? This condition is more likely to start at an earlier age if you have certain medical conditions or have undergone treatments, including: A tumor of the pituitary gland in the brain. A disease that affects the ovaries and hormones. Certain cancer treatments, such as chemotherapy or hormone therapy, or radiation therapy on the pelvis. Heavy smoking and excessive alcohol use. Family history of early menopause. This condition is also more likely to develop earlier in women who are very thin. What are the signs or symptoms? Symptoms of this condition include: Hot flashes. Irregular menstrual periods. Night sweats. Changes in feelings about sex. This could be a decrease in sex drive or an increased discomfort around your sexuality. Vaginal dryness and thinning of the vaginal walls. This may cause painful sex. Dryness of the skin and  development of wrinkles. Headaches. Problems sleeping (insomnia). Mood swings or irritability. Memory problems. Weight gain. Hair growth on the face and chest. Bladder infections or problems with urinating. How is this diagnosed? This condition is diagnosed based on your medical history, a physical exam, your age, your menstrual history, and your symptoms. Hormone tests may also be done. How is this treated? In some cases, no treatment is needed. You and your health care provider should make a decision together about whether treatment is necessary. Treatment will be based on your individual condition and preferences. Treatment for this condition focuses on managing symptoms. Treatment may include: Menopausal hormone therapy (MHT). Medicines to treat specific symptoms or complications. Acupuncture. Vitamin or herbal supplements. Before starting treatment, make sure to let your health care provider know if you have a personal or family history of these conditions: Heart disease. Breast cancer. Blood clots. Diabetes. Osteoporosis. Follow these instructions at home: Lifestyle Do not use any products that contain nicotine or tobacco, such as cigarettes, e-cigarettes, and chewing tobacco. If you need help quitting, ask your health care provider. Get at least 30 minutes of physical activity on 5 or more days each week. Avoid alcoholic and caffeinated beverages, as well as spicy foods. This may help prevent hot flashes. Get 7-8 hours of sleep each night. If you have hot flashes, try: Dressing in layers. Avoiding things that may trigger hot flashes, such as spicy food, warm places, or stress. Taking slow, deep breaths when a hot flash starts. Keeping a fan in your home and office. Find ways to manage stress, such as deep breathing, meditation, or journaling. Consider going to group therapy with other women who are having menopause symptoms. Ask your health care   provider about recommended  group therapy meetings. Eating and drinking  Eat a healthy, balanced diet that contains whole grains, lean protein, low-fat dairy, and plenty of fruits and vegetables. Your health care provider may recommend adding more soy to your diet. Foods that contain soy include tofu, tempeh, and soy milk. Eat plenty of foods that contain calcium and vitamin D for bone health. Items that are rich in calcium include low-fat milk, yogurt, beans, almonds, sardines, broccoli, and kale. Medicines Take over-the-counter and prescription medicines only as told by your health care provider. Talk with your health care provider before starting any herbal supplements. If prescribed, take vitamins and supplements as told by your health care provider. General instructions  Keep track of your menstrual periods, including: When they occur. How heavy they are and how long they last. How much time passes between periods. Keep track of your symptoms, noting when they start, how often you have them, and how long they last. Use vaginal lubricants or moisturizers to help with vaginal dryness and improve comfort during sex. Keep all follow-up visits. This is important. This includes any group therapy or counseling. Contact a health care provider if: You are still having menstrual periods after age 76. You have pain during sex. You have not had a period for 12 months and you develop vaginal bleeding. Get help right away if you have: Severe depression. Excessive vaginal bleeding. Pain when you urinate. A fast or irregular heartbeat (palpitations). Severe headaches. Abdominal pain or severe indigestion. Summary Menopause is a normal time of life when menstrual periods stop completely. It is usually defined as the absence of a menstrual period for 12 months without another medical cause. The transition to menopause (perimenopause) most often happens between the ages of 36 and 93 and can last for several years. Symptoms  can be managed through medicines, lifestyle changes, and complementary therapies such as acupuncture. Eat a balanced diet that is rich in nutrients to promote bone health and heart health and to manage symptoms during menopause. This information is not intended to replace advice given to you by your health care provider. Make sure you discuss any questions you have with your health care provider. Document Revised: 03/05/2020 Document Reviewed: 11/20/2019 Elsevier Patient Education  [place]. Kegel Exercises Kegel exercises can help strengthen your pelvic floor muscles. The pelvic floor is a group of muscles that support your rectum, small intestine, and bladder. In females, pelvic floor muscles also help support the womb (uterus). These muscles help you control the flow of urine and stool. Kegel exercises are painless and simple, and they do not require any equipment. Your provider may suggest Kegel exercises to: Improve bladder and bowel control. Improve sexual response. Improve weak pelvic floor muscles after surgery to remove the uterus (hysterectomy) or pregnancy (females). Improve weak pelvic floor muscles after prostate gland removal or surgery (males). Kegel exercises involve squeezing your pelvic floor muscles, which are the same muscles you squeeze when you try to stop the flow of urine or keep from passing gas. The exercises can be done while sitting, standing, or lying down, but it is best to vary your position. Exercises How to do Kegel exercises: Squeeze your pelvic floor muscles tight. You should feel a tight lift in your rectal area. If you are a female, you should also feel a tightness in your vaginal area. Keep your stomach, buttocks, and legs relaxed. Hold the muscles tight for up to 10 seconds. Breathe normally. Relax your muscles. Repeat  as told by your health care provider. Repeat this exercise daily as told by your health care provider. Continue to do this exercise  for at least 4-6 weeks, or for as long as told by your health care provider. You may be referred to a physical therapist who can help you learn more about how to do Kegel exercises. Depending on your condition, your health care provider may recommend: Varying how long you squeeze your muscles. Doing several sets of exercises every day. Doing exercises for several weeks. Making Kegel exercises a part of your regular exercise routine. This information is not intended to replace advice given to you by your health care provider. Make sure you discuss any questions you have with your health care provider. Document Revised: 05/26/2020 Document Reviewed: 01/23/2018 Elsevier Patient Education  [place].

## 2021-04-07 NOTE — Progress Notes (Addendum)
Gynecology Annual Exam  PCP: Virginia Crews, MD  Chief Complaint:  Chief Complaint  Patient presents with   Annual Exam    History of Present Illness:Patient is a 50 y.o. G1P1 presents for annual exam. The patient has complaint today or urine leakage especially when sneezing, coughing, etc. In the past year she has undergone surgery and radiation for left breast malignancy. She is still taking Letrozole.  LMP: Patient's last menstrual period was 12/04/2020. Average Interval: every 1-4 months Duration of flow: 5 days Heavy Menses: varies Clots: no Intermenstrual Bleeding: no Postcoital Bleeding: no Dysmenorrhea: no  The patient is sexually active. She denies dyspareunia.  The patient does perform self breast exams.  There is no notable family history of breast or ovarian cancer in her family.  The patient wears seatbelts: yes.   The patient has regular exercise:  she walks some, she and her husband are following Weight Watcher's diet plan, she tries to stay hydrated and drinks some sodas, she admits adequate sleep .    The patient denies current symptoms of depression.     Review of Systems: Review of Systems  Constitutional:  Negative for chills and fever.  HENT:  Negative for congestion, ear discharge, ear pain, hearing loss, sinus pain and sore throat.   Eyes:  Negative for blurred vision and double vision.  Respiratory:  Negative for cough, shortness of breath and wheezing.   Cardiovascular:  Negative for chest pain, palpitations and leg swelling.  Gastrointestinal:  Negative for abdominal pain, blood in stool, constipation, diarrhea, heartburn, melena, nausea and vomiting.  Genitourinary:  Negative for dysuria, flank pain, frequency, hematuria and urgency.  Musculoskeletal:  Negative for back pain, joint pain and myalgias.  Skin:  Negative for itching and rash.  Neurological:  Negative for dizziness, tingling, tremors, sensory change, speech change, focal  weakness, seizures, loss of consciousness, weakness and headaches.  Endo/Heme/Allergies:  Negative for environmental allergies. Does not bruise/bleed easily.  Psychiatric/Behavioral:  Negative for depression, hallucinations, memory loss, substance abuse and suicidal ideas. The patient is not nervous/anxious and does not have insomnia.    Past Medical History:  Patient Active Problem List   Diagnosis Date Noted   Genetic testing 09/23/2020    Negative genetic testing. No pathogenic variants identified on the [facility] CancerNext-Expanded+RNA panel. The report date is 09/23/2020.  The CancerNext-Expanded + RNAinsight gene panel offered by [employer] and includes sequencing and rearrangement analysis for the following 77 genes: IP, ALK, APC*, ATM*, AXIN2, BAP1, BARD1, BLM, BMPR1A, BRCA1*, BRCA2*, BRIP1*, CDC73, CDH1*,CDK4, CDKN1B, CDKN2A, CHEK2*, CTNNA1, DICER1, FANCC, FH, FLCN, GALNT12, KIF1B, LZTR1, MAX, MEN1, MET, MLH1*, MSH2*, MSH3, MSH6*, MUTYH*, NBN, NF1*, NF2, NTHL1, PALB2*, PHOX2B, PMS2*, POT1, PRKAR1A, PTCH1, PTEN*, RAD51C*, RAD51D*,RB1, RECQL, RET, SDHA, SDHAF2, SDHB, SDHC, SDHD, SMAD4, SMARCA4, SMARCB1, SMARCE1, STK11, SUFU, TMEM127, TP53*,TSC1, TSC2, VHL and XRCC2 (sequencing and deletion/duplication); EGFR, EGLN1, HOXB13, KIT, MITF, PDGFRA, POLD1 and POLE (sequencing only); EPCAM and GREM1 (deletion/duplication only).     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 No atypia or malignancy.   BREAST BIOPSY Left 07/15/2020   [place] bx, heart marker, path pending   BREAST DUCTAL SYSTEM EXCISION Left 08/09/2020   Procedure: EXCISION DUCTAL SYSTEM BREAST, left intraductal papilloma;  Surgeon: [name], MD;  Location: [facility];  Service: General;  Laterality: Left;   BREAST LUMPECTOMY Left 09/13/2020   Procedure: BREAST LUMPECTOMY, re-excision;  Surgeon: [name], MD;  Location: [facility];  Service: General;  Laterality: Left;   BREAST LUMPECTOMY Left 10/08/2020   Procedure: BREAST LUMPECTOMY, re-excision;  Surgeon: [name], MD;  Location: [facility];  Service: General;  Laterality: Left;   BREAST LUMPECTOMY,RADIO FREQ [place] BIOPSY Left 08/09/2020   Procedure: BREAST LUMPECTOMY,RADIO FREQ LOCALIZER,AXILLARY SENTINEL LYMPH NODE BIOPSY;  Surgeon: [name], MD;  Location: [facility];  Service: General;  Laterality: Left;   COLONOSCOPY WITH PROPOFOL N/A 07/12/2020   Procedure: COLONOSCOPY WITH PROPOFOL;  Surgeon: Lucilla Lame, MD;  Location: [place];  Service: Endoscopy;  Laterality: N/A;  PRIORITY [place] OF UTERUS  2011   eyelid surgery      ORBITAL RECONSTRUCTION Left 2008   graves disease caused "bug eyed"   POLYPECTOMY  07/12/2020   Procedure: POLYPECTOMY;  Surgeon: Lucilla Lame, MD;  Location: [place];  Service: Endoscopy;;   WISDOM TOOTH EXTRACTION      Gynecologic History:  Patient's last menstrual period was 12/04/2020. Last Pap: 3 years ago Results were:  no  abnormalities  Last mammogram: 1 year ago Results were: BI-RADS IV Suspicious, with regular follow up and treatment since  Obstetric History: G1P1  Family History:  Family History  Problem Relation Age of Onset   Diabetes Mother    Hypertension Mother    Hyperlipidemia Mother    Hypertension Father    Hyperlipidemia Father    Hypothyroidism Father    Prostate cancer Father 62   Hyperlipidemia Sister    Hypertension Sister    Hyperlipidemia Sister    Hypertension Sister    Diabetes Sister    Hyperlipidemia Sister    Hypertension Sister    Breast cancer Maternal Grandmother 87    Social History:  Social History   Socioeconomic History   Marital status: Married    Spouse name: Event organiser   Number of children: Not on file   Years of education: Not on file   Highest education level: Not on file  Occupational History   Occupation: word processing    Comment: full time  Tobacco Use   Smoking status: Never   Smokeless tobacco: Never  Vaping Use   Vaping Use: Never used  Substance and Sexual Activity   Alcohol use: Yes    Comment: very rarely (maybe once a year)   Drug use: No   Sexual activity: Yes    Birth control/protection: None  Other Topics Concern   Not on file  Social History Narrative   Patient lives with husband.    She feels safe in her home.  Social Determinants of Health   Financial Resource Strain: Not on file  Food Insecurity: Not on file  Transportation Needs: Not on file  Physical Activity: Not on file  Stress: Not on file  Social Connections: Not on file  Intimate Partner Violence: Not on file    Allergies:  Allergies  Allergen Reactions   Polytrim [Polymyxin B-Trimethoprim] Rash    Was used on eyes after surgery which gave her a rash     Medications: Prior to Admission medications   Medication Sig Start Date End Date Taking? Authorizing Provider  acetaminophen (TYLENOL) 650 MG CR tablet Take 1,300 mg by mouth every 8 (eight) hours as  needed for pain. 12/19/19  Yes [provider]  Artificial Tear Solution (TEARS NATURALE OP) Place 1 drop into both eyes daily.   Yes [provider]  Calcium Carbonate-Vitamin D (CALCIUM 600+D PO) Take 2 tablets by mouth daily.   Yes [provider]  CRANBERRY PO Take 2 tablets by mouth daily.   Yes [provider]  FIBER PO Take 1 capsule by mouth daily. Gummy   Yes [provider]  fish oil-omega-3 fatty acids 1000 MG capsule Take 2 g by mouth daily.   Yes [provider]  Garlic Oil 7903 MG CAPS Take 1,000 mg by mouth daily. 06/19/16  Yes [provider]  Ibuprofen 200 MG CAPS Take 4 capsules (800 mg total) by mouth every 6 (six) hours as needed (pain, fever, headache). Patient taking differently: Take 400 mg by mouth every 6 (six) hours as needed (pain, fever, headache). 08/09/20  Yes [name], MD  Ibuprofen-diphenhydrAMINE HCl (ADVIL PM) 200-25 MG CAPS Take 1 tablet by mouth at bedtime as needed (sleep).   Yes [provider]  letrozole (FEMARA) 2.5 MG tablet TAKE 1 TABLET(2.5 MG) BY MOUTH DAILY 04/01/21  Yes Lloyd Huger, MD  loratadine (CLARITIN) 10 MG tablet Take 10 mg by mouth daily as needed for allergies.   Yes [provider]  Multiple Vitamin (MULTIVITAMIN WITH MINERALS) TABS tablet Take 1 tablet by mouth daily. Women's One A Day   Yes [provider]  vitamin B-12 (CYANOCOBALAMIN) 1000 MCG tablet Take 1,000 mcg by mouth daily.   Yes [provider]  White Petrolatum-Mineral Oil (GENTEAL TEARS NIGHT-TIME) OINT Apply 1 drop to eye at bedtime.   Yes [provider]    Physical Exam Vitals: Blood pressure 138/78, pulse 73, height $RemoveBe'5\' 4"'SpDINuZOl$  (1.626 m), weight 189 lb (85.7 kg), last menstrual period 12/04/2020.  General: NAD HEENT: normocephalic, anicteric Thyroid: no enlargement, no palpable nodules Pulmonary: No increased work of breathing, CTAB Cardiovascular: RRR,  distal pulses 2+ Breast: Breast symmetrical, no tenderness, no palpable nodules or masses, no skin or nipple retraction present, no nipple discharge.  No axillary or supraclavicular lymphadenopathy. Healed incisions from surgical interventions Abdomen: NABS, soft, non-tender, non-distended.  Umbilicus without lesions.  No hepatomegaly, splenomegaly or masses palpable. No evidence of hernia  Genitourinary:  External: Normal external female genitalia.  Normal urethral meatus, normal Bartholin's and Skene's glands.    Vagina: Normal vaginal mucosa, no evidence of prolapse, muscle strength decreased    Cervix: Grossly normal in appearance, friable Extremities: no edema, erythema, or tenderness Neurologic: Grossly intact Psychiatric: mood appropriate, affect full     Assessment: 50 y.o. G1P1 routine annual exam  Plan: Problem List Items Addressed This Visit   None Visit Diagnoses     Well woman exam with routine gynecological exam    -  Primary   Relevant Orders   Cytology - PAP   Screening for cervical cancer       Relevant Orders   Cytology - PAP       1) Mammogram - recommend yearly screening mammogram.  Mammogram  is scheduled in February 2023  2) STI screening  was not offered and therefore not obtained  3) ASCCP guidelines and rationale discussed.  Patient opts for every 5 years screening interval  4) Osteoporosis  - per USPTF routine screening DEXA at age 36  Consider FDA-approved medical therapies in postmenopausal women and men aged 67 years and older, based on the following: a) A hip or vertebral (clinical or morphometric) fracture b) T-score ? -2.5 at the femoral neck or spine after appropriate evaluation to exclude secondary causes C) Low bone mass (T-score between -1.0 and -2.5 at the femoral neck or spine) and a 10-year probability of a hip fracture ? 3% or a 10-year probability of a major osteoporosis-related fracture ? 20% based on the [place]-adapted WHO  algorithm   5) Routine healthcare maintenance including cholesterol, diabetes screening discussed managed by PCP  6) Colonoscopy is up to date.  Screening recommended starting at age 21 for average risk individuals, age 16 for individuals deemed at increased risk (including African Americans) and recommended to continue until age 62.  For patient age 34-85 individualized approach is recommended.  Gold standard screening is via colonoscopy, Cologuard screening is an acceptable alternative for patient unwilling or unable to undergo colonoscopy.  "Colorectal cancer screening for average?risk adults: 2018 guideline update from the American Cancer Society"CA: A Cancer Journal for Clinicians: Nov 15, 2016   7) Urinary incontinence: Kegel exercises, follow up as needed for worsening  8) Return in about 1 year (around 04/07/2022) for annual established gyn.    Christean Leaf, CNM [facility] 04/07/21, 1:10 PM

## 2021-04-08 LAB — CYTOLOGY - PAP
Comment: NEGATIVE
Diagnosis: NEGATIVE
High risk HPV: NEGATIVE

## 2021-04-13 ENCOUNTER — Other Ambulatory Visit: Payer: Self-pay

## 2021-04-13 ENCOUNTER — Inpatient Hospital Stay: Payer: Managed Care, Other (non HMO) | Admitting: Occupational Therapy

## 2021-04-13 DIAGNOSIS — C50212 Malignant neoplasm of upper-inner quadrant of left female breast: Secondary | ICD-10-CM

## 2021-04-13 NOTE — Therapy (Signed)
[facility] [address] [place], [place], [place], [address] Phone: [phone]   Fax:  [phone]  Occupational Therapy Screen:  Patient Details  Name: Beth [name] MRN: [id number] Date of Birth: [birth year] No data recorded  Encounter Date: 04/13/2021   OT End of Session - 04/13/21 1029     Visit Number 0             Past Medical History:  Diagnosis Date   Anxiety    due to Covid    Cardiac disease    d/t family history of cardiac issues   Depression    due to Covid    Diffuse cystic mastopathy    Family history of breast cancer    Family history of malignant neoplasm of breast    Family history of prostate cancer    Graves disease    In remission   History of kidney stones    history of stone in past   Hyperlipidemia    Hyperthyroidism    remission   PONV (postoperative nausea and vomiting)     Past Surgical History:  Procedure Laterality Date   BREAST BIOPSY Right 2008   benign   BREAST BIOPSY Left 2/13    aspiration cyst   BREAST BIOPSY Left 01/2020   Sclerosing intraductal papilloma with apocrine metaplasia. No atypia or malignancy.   BREAST BIOPSY Left 07/15/2020   [place] bx, heart marker, path pending   BREAST DUCTAL SYSTEM EXCISION Left 08/09/2020   Procedure: EXCISION DUCTAL SYSTEM BREAST, left intraductal papilloma;  Surgeon: [name], MD;  Location: [facility];  Service: General;  Laterality: Left;   BREAST LUMPECTOMY Left 09/13/2020   Procedure: BREAST LUMPECTOMY, re-excision;  Surgeon: [name], MD;  Location: [facility];  Service: General;  Laterality: Left;   BREAST LUMPECTOMY Left 10/08/2020   Procedure: BREAST LUMPECTOMY, re-excision;  Surgeon: [name], MD;  Location: [facility];  Service: General;  Laterality: Left;   BREAST LUMPECTOMY,RADIO FREQ [place] BIOPSY Left 08/09/2020   Procedure: BREAST LUMPECTOMY,RADIO FREQ LOCALIZER,AXILLARY  SENTINEL LYMPH NODE BIOPSY;  Surgeon: [name], MD;  Location: [facility];  Service: General;  Laterality: Left;   COLONOSCOPY WITH PROPOFOL N/A 07/12/2020   Procedure: COLONOSCOPY WITH PROPOFOL;  Surgeon: Lucilla Lame, MD;  Location: [place];  Service: Endoscopy;  Laterality: N/A;  PRIORITY [place] OF UTERUS  2011   eyelid surgery      ORBITAL RECONSTRUCTION Left 2008   graves disease caused "bug eyed"   POLYPECTOMY  07/12/2020   Procedure: POLYPECTOMY;  Surgeon: Lucilla Lame, MD;  Location: [place];  Service: Endoscopy;;   WISDOM TOOTH EXTRACTION      There were no vitals filed for this visit.   Subjective Assessment - 04/13/21 1029     Subjective  Doing okay - do not feel any pain , stiffness , heaviness or tightness in L breast or shoulder                 LYMPHEDEMA/ONCOLOGY QUESTIONNAIRE - 04/13/21 0001       Right Upper Extremity Lymphedema   15 cm Proximal to Olecranon Process 37 cm    10 cm Proximal to Olecranon Process 35.5 cm    Olecranon Process 26 cm    15 cm Proximal to Ulnar Styloid Process 25.5 cm    10 cm Proximal to Ulnar Styloid Process 21.5 cm    Just Proximal to  Ulnar Styloid Process 15.5 cm    Across Hand at [employer] 18.8 cm      Left Upper Extremity Lymphedema   15 cm Proximal to Olecranon Process 34.5 cm    10 cm Proximal to Olecranon Process 32.5 cm    Olecranon Process 25.5 cm    15 cm Proximal to Ulnar Styloid Process 25 cm    10 cm Proximal to Ulnar Styloid Process 21.5 cm    Just Proximal to Ulnar Styloid Process 16 cm    Across Hand at [employer] 18 cm                DR Grayland Ormond NOTE ON 03/24/21 ASSESSMENT: Pathologic stage Ia ER/PR positive, 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  Oncotype DX score 3.     PLAN:     1. 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  Despite reexcision on September 13, 2020, patient was noted to have a persistent 1 mm positive margin plan is to go back for a third excision.  Her Oncotype score was 3 conferring low risk, therefore chemotherapy was not necessary despite her 3+ lymph nodes.  Patient completed adjuvant XRT in July 2022.  She was given a prescription for letrozole which she will take for a minimum of 5 years completing treatment in July 2027, but given her node positive disease, can consider extending treatment.  Patient will require repeat mammogram in February 2023.  Return to clinic in 6 months for routine evaluation.   2.  Genetic testing: Negative. 3.  Bone health: Bone mineral density on January 05, 2021 was reported as normal.  Repeat in July 2024.   Patient expressed understanding and was in agreement with this plan. She also understands that She can call clinic at any time with any questions, concerns, or complaints.    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, left [facility])    Problem List Patient Active Problem List   Diagnosis Date Noted   Genetic testing 09/23/2020   Family history of prostate  cancer    Malignant neoplasm of left female breast ([place])    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, left ([place]) 07/23/2020   Hyperlipidemia    Special screening for malignant neoplasms, colon    Polyp of sigmoid colon    Obesity 04/24/2018   Breast cyst, left 12/14/2017   Fatigue 01/29/2017   Right thyroid nodule 01/28/2016   Allergic rhinitis 10/23/2014   Graves disease 10/23/2014   Blood glucose elevated 10/23/2014   Family history of breast cancer 09/25/2013   Diffuse cystic mastopathy 09/25/2013   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 [place], [place], [address] Phone: [phone]   Fax:  [phone]  Name: Beth [name] MRN: [id number] Date of Birth: Mar 30, 1971

## 2021-05-20 ENCOUNTER — Encounter: Payer: Self-pay | Admitting: Family Medicine

## 2021-05-20 ENCOUNTER — Ambulatory Visit (INDEPENDENT_AMBULATORY_CARE_PROVIDER_SITE_OTHER): Payer: Managed Care, Other (non HMO) | Admitting: Family Medicine

## 2021-05-20 ENCOUNTER — Other Ambulatory Visit: Payer: Self-pay

## 2021-05-20 VITALS — BP 140/80 | HR 82 | Temp 98.2°F | Resp 16 | Ht 64.0 in | Wt 183.7 lb

## 2021-05-20 DIAGNOSIS — E0500 Thyrotoxicosis with diffuse goiter without thyrotoxic crisis or storm: Secondary | ICD-10-CM

## 2021-05-20 DIAGNOSIS — Z0000 Encounter for general adult medical examination without abnormal findings: Secondary | ICD-10-CM | POA: Diagnosis not present

## 2021-05-20 DIAGNOSIS — Z23 Encounter for immunization: Secondary | ICD-10-CM | POA: Diagnosis not present

## 2021-05-20 DIAGNOSIS — E669 Obesity, unspecified: Secondary | ICD-10-CM

## 2021-05-20 DIAGNOSIS — M720 Palmar fascial fibromatosis [Dupuytren]: Secondary | ICD-10-CM

## 2021-05-20 DIAGNOSIS — R739 Hyperglycemia, unspecified: Secondary | ICD-10-CM

## 2021-05-20 DIAGNOSIS — E7800 Pure hypercholesterolemia, unspecified: Secondary | ICD-10-CM

## 2021-05-20 DIAGNOSIS — Z6831 Body mass index (BMI) 31.0-31.9, adult: Secondary | ICD-10-CM

## 2021-05-20 NOTE — Assessment & Plan Note (Signed)
Reviewed last lipid panel Not currently on a statin Recheck FLP and CMP Discussed diet and exercise  

## 2021-05-20 NOTE — Assessment & Plan Note (Signed)
Trial of voltaren gel Consider referral to hand surgery in the future

## 2021-05-20 NOTE — Assessment & Plan Note (Signed)
Discussed importance of healthy weight management Discussed diet and exercise  

## 2021-05-20 NOTE — Progress Notes (Signed)
Complete physical exam   Patient: Beth [name]   DOB: Feb 18, 1971   50 y.o. Female  MRN: [id number] Visit Date: 05/20/2021  Today's healthcare provider: Lavon Paganini, MD   Chief Complaint  Patient presents with   Annual Exam   Subjective    Beth Bal Rozak is a 50 y.o. female who presents today for a complete physical exam.  She reports consuming a  weight watchers  diet. Home exercise routine includes walking. She generally feels well. She reports sleeping fairly well. She does have additional problems to discuss today. She c/o stiffness and pain with bending ring and middle finger.  HPI  Pap:04/08/21-Normal Mammogram: Scheduled 07/25/2021 Bone Density:01/05/21 Colonoscopy:07/12/2020  Past Medical History:  Diagnosis Date   Anxiety    due to Covid    Cardiac disease    d/t family history of cardiac issues   Depression    due to Covid    Diffuse cystic mastopathy    Family history of breast cancer    Family history of malignant neoplasm of breast    Family history of prostate cancer    Graves disease    In remission   History of kidney stones    history of stone in past   Hyperlipidemia    Hyperthyroidism    remission   PONV (postoperative nausea and vomiting)    Past Surgical History:  Procedure Laterality Date   BREAST BIOPSY Right 2008   benign   BREAST BIOPSY Left 2/13    aspiration cyst   BREAST BIOPSY Left 01/2020   Sclerosing intraductal papilloma with apocrine metaplasia. No atypia or malignancy.   BREAST BIOPSY Left 07/15/2020   [place] bx, heart marker, path pending   BREAST DUCTAL SYSTEM EXCISION Left 08/09/2020   Procedure: EXCISION DUCTAL SYSTEM BREAST, left intraductal papilloma;  Surgeon: [name], MD;  Location: [facility];  Service: General;  Laterality: Left;   BREAST LUMPECTOMY Left 09/13/2020   Procedure: BREAST LUMPECTOMY, re-excision;  Surgeon: [name], MD;  Location: [facility];  Service: General;  Laterality: Left;   BREAST  LUMPECTOMY Left 10/08/2020   Procedure: BREAST LUMPECTOMY, re-excision;  Surgeon: [name], MD;  Location: [facility];  Service: General;  Laterality: Left;   BREAST LUMPECTOMY,RADIO FREQ [place] BIOPSY Left 08/09/2020   Procedure: BREAST LUMPECTOMY,RADIO FREQ LOCALIZER,AXILLARY SENTINEL LYMPH NODE BIOPSY;  Surgeon: [name], MD;  Location: [facility];  Service: General;  Laterality: Left;   COLONOSCOPY WITH PROPOFOL N/A 07/12/2020   Procedure: COLONOSCOPY WITH PROPOFOL;  Surgeon: Lucilla Lame, MD;  Location: [place];  Service: Endoscopy;  Laterality: N/A;  PRIORITY [place] OF UTERUS  2011   eyelid surgery      ORBITAL RECONSTRUCTION Left 2008   graves disease caused "bug eyed"   POLYPECTOMY  07/12/2020   Procedure: POLYPECTOMY;  Surgeon: Lucilla Lame, MD;  Location: [place];  Service: Endoscopy;;   WISDOM TOOTH EXTRACTION     Social History   Socioeconomic History   Marital status: Married    Spouse name: Event organiser   Number of children: Not on file   Years of education: Not on file   Highest education level: Not on file  Occupational History   Occupation: word processing    Comment: full time  Tobacco Use   Smoking status: Never   Smokeless tobacco: Never  Vaping Use   Vaping Use: Never used  Substance and Sexual Activity   Alcohol use:  Yes    Comment: very rarely (maybe once a year)   Drug use: No   Sexual activity: Yes    Birth control/protection: None  Other Topics Concern   Not on file  Social History Narrative   Patient lives with husband.    She feels safe in her home.   Social Determinants of Health   Financial Resource Strain: Not on file  Food Insecurity: Not on file  Transportation Needs: Not on file  Physical Activity: Not on file  Stress: Not on file  Social Connections: Not on file  Intimate Partner Violence: Not on file   Family Status  Relation Name Status   Mother   Alive   Father  Alive   Sister  Alive   Brother  Alive   Sister  Alive   Sister  Alive   MGM  Deceased   [place]   MGF  Deceased   [place]  Deceased   PGF  Deceased   Family History  Problem Relation Age of Onset   Diabetes Mother    Hypertension Mother    Hyperlipidemia Mother    Hypertension Father    Hyperlipidemia Father    Hypothyroidism Father    Prostate cancer Father 15   Hyperlipidemia Sister    Hypertension Sister    Hyperlipidemia Sister    Hypertension Sister    Diabetes Sister    Hyperlipidemia Sister    Hypertension Sister    Breast cancer Maternal Grandmother 61   Allergies  Allergen Reactions   Polytrim [Polymyxin B-Trimethoprim] Rash    Was used on eyes after surgery which gave her a rash     Patient Care Team: Kisa Fujii, Dionne Bucy, MD as PCP - General (Family Medicine) Christene Lye, MD (General Surgery) Gae Dry, MD as Referring Physician (Obstetrics and Gynecology) Theodore Demark, RN as Oncology Nurse Navigator Noreene Filbert, MD as Referring Physician (Radiation Oncology) Lloyd Huger, MD as Consulting Physician (Oncology) [name], MD (Inactive) as Consulting Physician (General Surgery)   Medications: Outpatient Medications Prior to Visit  Medication Sig   acetaminophen (TYLENOL) 650 MG CR tablet Take 1,300 mg by mouth every 8 (eight) hours as needed for pain.   Artificial Tear Solution (TEARS NATURALE OP) Place 1 drop into both eyes daily.   Calcium Carbonate-Vitamin D (CALCIUM 600+D PO) Take 2 tablets by mouth daily.   CRANBERRY PO Take 2 tablets by mouth daily.   FIBER PO Take 1 capsule by mouth daily. Gummy   fish oil-omega-3 fatty acids 1000 MG capsule Take 2 g by mouth daily.   Garlic Oil 1610 MG CAPS Take 1,000 mg by mouth daily.   Ibuprofen 200 MG CAPS Take 4 capsules (800 mg total) by mouth every 6 (six) hours as needed (pain, fever, headache). (Patient taking differently:  Take 400 mg by mouth every 6 (six) hours as needed (pain, fever, headache).)   Ibuprofen-diphenhydrAMINE HCl (ADVIL PM) 200-25 MG CAPS Take 1 tablet by mouth at bedtime as needed (sleep).   letrozole (FEMARA) 2.5 MG tablet TAKE 1 TABLET(2.5 MG) BY MOUTH DAILY   loratadine (CLARITIN) 10 MG tablet Take 10 mg by mouth daily as needed for allergies.   Multiple Vitamin (MULTIVITAMIN WITH MINERALS) TABS tablet Take 1 tablet by mouth daily. Women's One A Day   vitamin B-12 (CYANOCOBALAMIN) 1000 MCG tablet Take 1,000 mcg by mouth daily.   White Petrolatum-Mineral Oil (GENTEAL TEARS NIGHT-TIME) OINT Apply  1 drop to eye at bedtime.   No facility-administered medications prior to visit.    Review of Systems  Constitutional: Negative.   HENT: Negative.    Eyes: Negative.   Respiratory: Negative.    Cardiovascular: Negative.   Endocrine: Negative.   Genitourinary: Negative.   Musculoskeletal: Negative.   Skin: Negative.   Allergic/Immunologic: Negative.   Neurological: Negative.   Hematological: Negative.   Psychiatric/Behavioral:  Positive for decreased concentration.      Objective    BP 140/80 (BP Location: Right Arm, Patient Position: Sitting, Cuff Size: Normal)   Pulse 82   Temp 98.2 F (36.8 C) (Oral)   Resp 16   Ht 5\' 4"  (1.626 m)   Wt 183 lb 11.2 oz (83.3 kg)   SpO2 98%   BMI 31.53 kg/m    Physical Exam Vitals reviewed.  Constitutional:      General: She is not in acute distress.    Appearance: Normal appearance. She is well-developed. She is not diaphoretic.  HENT:     Head: Normocephalic and atraumatic.     Right Ear: Tympanic membrane, ear canal and external ear normal.     Left Ear: Tympanic membrane, ear canal and external ear normal.     Nose: Nose normal.     Mouth/Throat:     Mouth: Mucous membranes are moist.     Pharynx: Oropharynx is clear. No oropharyngeal exudate.  Eyes:     General: No scleral icterus.    Conjunctiva/sclera: Conjunctivae normal.      Pupils: Pupils are equal, round, and reactive to light.  Neck:     Thyroid: No thyromegaly.  Cardiovascular:     Rate and Rhythm: Normal rate and regular rhythm.     Pulses: Normal pulses.     Heart sounds: Normal heart sounds. No murmur heard. Pulmonary:     Effort: Pulmonary effort is normal. No respiratory distress.     Breath sounds: Normal breath sounds. No wheezing or rales.  Abdominal:     General: There is no distension.     Palpations: Abdomen is soft.     Tenderness: There is no abdominal tenderness.  Musculoskeletal:        General: No deformity.     Cervical back: Neck supple.     Right lower leg: No edema.     Left lower leg: No edema.     Comments: Nodules in R palm  Lymphadenopathy:     Cervical: No cervical adenopathy.  Skin:    General: Skin is warm and dry.     Findings: No rash.  Neurological:     Mental Status: She is alert and oriented to person, place, and time. Mental status is at baseline.     Sensory: No sensory deficit.     Motor: No weakness.     Gait: Gait normal.  Psychiatric:        Mood and Affect: Mood normal.        Behavior: Behavior normal.        Thought Content: Thought content normal.      Last depression screening scores PHQ 2/9 Scores 05/20/2021 05/18/2020 04/28/2019  PHQ - 2 Score 1 1 2   PHQ- 9 Score - 3 3   Last fall risk screening Fall Risk  10/21/2020  Falls in the past year? 0  Number falls in past yr: -  Injury with Fall? -  Risk for fall due to : -  Follow up -   Last  Audit-C alcohol use screening Alcohol Use Disorder Test (AUDIT) 05/20/2021  1. How often do you have a drink containing alcohol? 0  2. How many drinks containing alcohol do you have on a typical day when you are drinking? 0  3. How often do you have six or more drinks on one occasion? 0  AUDIT-C Score 0  Alcohol Brief Interventions/Follow-up -   A score of 3 or more in women, and 4 or more in men indicates increased risk for alcohol abuse, EXCEPT if all  of the points are from question 1   No results found for any visits on 05/20/21.  Assessment & Plan    Routine Health Maintenance and Physical Exam  Exercise Activities and Dietary recommendations  Goals      Exercise 150 minutes per week (moderate activity)        Immunization History  Administered Date(s) Administered   Influenza-Unspecified 04/05/2017, 03/19/2020, 04/03/2021   Moderna Sars-Covid-2 Vaccination 08/23/2019, 09/27/2019, 05/20/2020   PNEUMOCOCCAL CONJUGATE-20 05/20/2021   Td 04/24/2018   Tdap 01/31/2008    Health Maintenance  Topic Date Due   Zoster Vaccines- Shingrix (1 of 2) Never done   COVID-19 Vaccine (4 - Booster for Moderna series) 07/15/2020   MAMMOGRAM  01/25/2022   PAP SMEAR-Modifier  04/07/2026   COLONOSCOPY (Pts 45-15yrs Insurance coverage will need to be confirmed)  07/13/2027   TETANUS/TDAP  04/24/2028   Pneumococcal Vaccine 36-37 Years old  Completed   INFLUENZA VACCINE  Completed   Hepatitis C Screening  Completed   HIV Screening  Completed   HPV VACCINES  Aged Out    Discussed health benefits of physical activity, and encouraged her to engage in regular exercise appropriate for her age and condition.  Problem List Items Addressed This Visit       Endocrine   Diffuse thyrotoxic goiter    Recheck TSH asymptomatic      Relevant Orders   TSH     Musculoskeletal and Integument   Dupuytren's disease of palm of right hand    Trial of voltaren gel Consider referral to hand surgery in the future        Other   Hypercholesteremia    Reviewed last lipid panel Not currently on a statin Recheck FLP and CMP Discussed diet and exercise       Relevant Orders   Comprehensive metabolic panel   Lipid panel   Blood glucose elevated   Relevant Orders   Hemoglobin A1c   Obesity    Discussed importance of healthy weight management Discussed diet and exercise       Other Visit Diagnoses     Encounter for annual physical exam     -  Primary   Relevant Orders   Comprehensive metabolic panel   Lipid panel   Hemoglobin A1c   TSH   Need for pneumococcal vaccination       Relevant Orders   Pneumococcal conjugate vaccine 20-valent (Prevnar 20) (Completed)        Return in about 1 year (around 05/20/2022) for CPE.     I, Lavon Paganini, MD, have reviewed all documentation for this visit. The documentation on 05/20/21 for the exam, diagnosis, procedures, and orders are all accurate and complete.   Passion Lavin, Dionne Bucy, MD, MPH [facility]

## 2021-05-20 NOTE — Assessment & Plan Note (Signed)
Recheck TSH asymptomatic 

## 2021-05-20 NOTE — Patient Instructions (Signed)
   The CDC recommends two doses of Shingrix (the shingles vaccine) separated by 2 to 6 months for adults age 50 years and older. I recommend checking with your insurance plan regarding coverage for this vaccine.   

## 2021-05-21 LAB — LIPID PANEL
Chol/HDL Ratio: 3.2 ratio (ref 0.0–4.4)
Cholesterol, Total: 216 mg/dL — ABNORMAL HIGH (ref 100–199)
HDL: 67 mg/dL (ref >39)
LDL Chol Calc (NIH): 139 mg/dL — ABNORMAL HIGH (ref 0–99)
Triglycerides: 59 mg/dL (ref 0–149)
VLDL Cholesterol Cal: 10 mg/dL (ref 5–40)

## 2021-05-21 LAB — COMPREHENSIVE METABOLIC PANEL
ALT: 22 IU/L (ref 0–32)
AST: 19 IU/L (ref 0–40)
Albumin/Globulin Ratio: 2.4 — ABNORMAL HIGH (ref 1.2–2.2)
Albumin: 5.1 g/dL — ABNORMAL HIGH (ref 3.8–4.9)
Alkaline Phosphatase: 124 IU/L — ABNORMAL HIGH (ref 44–121)
BUN/Creatinine Ratio: 23 (ref 9–23)
BUN: 14 mg/dL (ref 6–24)
Bilirubin Total: 0.3 mg/dL (ref 0.0–1.2)
CO2: 24 mmol/L (ref 20–29)
Calcium: 10.3 mg/dL — ABNORMAL HIGH (ref 8.7–10.2)
Chloride: 102 mmol/L (ref 96–106)
Creatinine, Ser: 0.61 mg/dL (ref 0.57–1.00)
Globulin, Total: 2.1 g/dL (ref 1.5–4.5)
Glucose: 98 mg/dL (ref 70–99)
Potassium: 4.7 mmol/L (ref 3.5–5.2)
Sodium: 141 mmol/L (ref 134–144)
Total Protein: 7.2 g/dL (ref 6.0–8.5)
eGFR: 108 mL/min/{1.73_m2} (ref >59)

## 2021-05-21 LAB — TSH: TSH: 0.583 u[IU]/mL (ref 0.450–4.500)

## 2021-05-21 LAB — HEMOGLOBIN A1C
Est. average glucose Bld gHb Est-mCnc: 126 mg/dL
Hgb A1c MFr Bld: 6 % — ABNORMAL HIGH (ref 4.8–5.6)

## 2021-06-14 ENCOUNTER — Encounter: Payer: Self-pay | Admitting: *Deleted

## 2021-06-14 ENCOUNTER — Telehealth: Payer: Self-pay | Admitting: *Deleted

## 2021-06-14 NOTE — Telephone Encounter (Signed)
Per mychart msg from patient. She will pick up the forms this week. I have printed her path report and op note on her behalf and placed these in the secure envelope for patient to pick up. Patient needed her path and op note for the claim to be processed.

## 2021-06-14 NOTE — Telephone Encounter (Signed)
critical illness policy form rcvd on 88/89/16 from patient. Form completed. Pending provider's signature.

## 2021-07-25 ENCOUNTER — Inpatient Hospital Stay: Admission: RE | Admit: 2021-07-25 | Payer: Managed Care, Other (non HMO) | Source: Ambulatory Visit

## 2021-08-03 ENCOUNTER — Other Ambulatory Visit: Payer: Self-pay | Admitting: Oncology

## 2021-08-04 ENCOUNTER — Ambulatory Visit
Admission: RE | Admit: 2021-08-04 | Discharge: 2021-08-04 | Disposition: A | Discharge status: Home / Self Care | Payer: Managed Care, Other (non HMO) | Source: Ambulatory Visit | Attending: Oncology | Admitting: Oncology

## 2021-08-04 ENCOUNTER — Other Ambulatory Visit: Payer: Self-pay

## 2021-08-04 DIAGNOSIS — R922 Inconclusive mammogram: Secondary | ICD-10-CM | POA: Insufficient documentation

## 2021-08-04 DIAGNOSIS — Z853 Personal history of malignant neoplasm of breast: Secondary | ICD-10-CM | POA: Insufficient documentation

## 2021-08-04 DIAGNOSIS — C50212 Malignant neoplasm of upper-inner quadrant of left female breast: Secondary | ICD-10-CM | POA: Diagnosis not present

## 2021-08-04 DIAGNOSIS — Z1231 Encounter for screening mammogram for malignant neoplasm of breast: Secondary | ICD-10-CM | POA: Insufficient documentation

## 2021-08-04 HISTORY — DX: Personal history of irradiation: Z92.3

## 2021-08-11 ENCOUNTER — Ambulatory Visit
Admission: RE | Admit: 2021-08-11 | Discharge: 2021-08-11 | Disposition: A | Discharge status: Home / Self Care | Payer: Managed Care, Other (non HMO) | Source: Ambulatory Visit | Attending: Radiation Oncology | Admitting: Radiation Oncology

## 2021-08-11 ENCOUNTER — Encounter: Payer: Self-pay | Admitting: Radiation Oncology

## 2021-08-11 ENCOUNTER — Other Ambulatory Visit: Payer: Self-pay

## 2021-08-11 DIAGNOSIS — C50212 Malignant neoplasm of upper-inner quadrant of left female breast: Secondary | ICD-10-CM | POA: Insufficient documentation

## 2021-08-11 DIAGNOSIS — Z170 Estrogen receptor positive status [ER+]: Secondary | ICD-10-CM | POA: Insufficient documentation

## 2021-08-11 DIAGNOSIS — Z923 Personal history of irradiation: Secondary | ICD-10-CM | POA: Insufficient documentation

## 2021-08-11 DIAGNOSIS — Z79811 Long term (current) use of aromatase inhibitors: Secondary | ICD-10-CM | POA: Insufficient documentation

## 2021-08-11 NOTE — Progress Notes (Signed)
Radiation Oncology Follow up Note  Name: Beth [name]   Date:   08/11/2021 MRN:  [id number] DOB: [birth year]    This 51 y.o. 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  Seen today in routine follow-up she is doing well.  She specifically denies breast tenderness cough or bone pain..  She is currently on Femara tolerating it well without side effect.  Patient had mammograms this month which I have reviewed were BI-RADS 2 benign.  COMPLICATIONS OF TREATMENT: none  FOLLOW UP COMPLIANCE: keeps appointments   PHYSICAL EXAM:  BP (!) (P) 145/88 (BP Location: Left Arm, Patient Position: Sitting)    Pulse (P) 79    Temp (P) 99.7 F (37.6 C) (Tympanic)    Resp (P) 18    Wt (P) 187 lb (84.8 kg)    BMI (P) 32.10 kg/m  Lungs are clear to A&P cardiac examination essentially unremarkable with regular rate and rhythm. No dominant mass or nodularity is noted in either breast in 2 positions examined. Incision is well-healed. No axillary or supraclavicular adenopathy is appreciated. Cosmetic result is excellent.  Still some slight hyperpigmentation of the skin.  Well-developed well-nourished patient in NAD. HEENT reveals PERLA, EOMI, discs not visualized.  Oral cavity is clear. No oral mucosal lesions are identified. Neck is clear without evidence of cervical or supraclavicular adenopathy. Lungs are clear to A&P. Cardiac examination is essentially unremarkable with regular rate and rhythm without murmur rub or thrill. Abdomen is benign with no organomegaly or masses noted. Motor sensory and DTR levels are  equal and symmetric in the upper and lower extremities. Cranial nerves II through XII are grossly intact. Proprioception is intact. No peripheral adenopathy or edema is identified. No motor or sensory levels are noted. Crude visual fields are within normal range.  RADIOLOGY RESULTS: Mammograms reviewed compatible with above-stated findings  PLAN: Present time patient now 6 months out doing well with no evidence of disease.  On pleased with her overall progress.  I have asked to see her back in 6 months for follow-up and then will start once year follow-up visits.  She continues on Femara without side effect.  Patient is to call with any concerns.  I would like to take this opportunity to thank you for allowing me to participate in the care of your patient.Noreene Filbert, MD

## 2021-08-26 ENCOUNTER — Encounter: Payer: Self-pay | Admitting: Oncology

## 2021-08-26 ENCOUNTER — Other Ambulatory Visit: Payer: Self-pay

## 2021-08-26 ENCOUNTER — Other Ambulatory Visit: Payer: Self-pay | Admitting: Oncology

## 2021-08-26 MED ORDER — LETROZOLE 2.5 MG PO TABS: ORAL_TABLET | ORAL | 1 refills | Status: DC

## 2021-09-18 NOTE — Progress Notes (Signed)
?[place]  ?[phone] [phone] ? ?ID: Beth [name] OB: 1971-06-09  MR#: [id number]  TZG#:[id number] ? ?Patient Care Team: ?Virginia Crews, MD as PCP - General (Family Medicine) ?Christene Lye, MD (General Surgery) ?Gae Dry, MD as Referring Physician (Obstetrics and Gynecology) ?Theodore Demark, RN as Oncology Nurse Navigator ?Noreene Filbert, MD as Referring Physician (Radiation Oncology) ?Lloyd Huger, MD as Consulting Physician (Oncology) ?[name], MD as Consulting Physician (General Surgery) ? 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  Oncotype DX score 3. ? ?INTERVAL HISTORY: Patient returns to clinic today for routine 15-month evaluation.  She currently feels well and is asymptomatic.  She is tolerating letrozole without significant side effects.  She has no neurologic complaints.  She denies any recent fevers or illnesses.  She has a good appetite and denies weight loss.  She has no chest pain, shortness of breath, cough, or hemoptysis.  She denies any nausea, vomiting, constipation, or diarrhea.  She has no urinary complaints.  Patient offers no specific complaints today. ? ?REVIEW OF SYSTEMS:   ?Review of Systems  ?Constitutional: Negative.  Negative for fever, malaise/fatigue and weight loss.  ?Respiratory: Negative.  Negative for cough, hemoptysis and shortness of breath.   ?Cardiovascular: Negative.  Negative for chest pain and leg swelling.  ?Gastrointestinal: Negative.  Negative for abdominal pain.  ?Genitourinary: Negative.  Negative for dysuria.  ?Musculoskeletal: Negative.  Negative for back pain.  ?Skin: Negative.  Negative for rash.  ?Neurological: Negative.  Negative for dizziness, focal weakness and weakness.  ?Psychiatric/Behavioral: Negative.  The patient is not nervous/anxious.   ? ?As per HPI. Otherwise, a  complete review of systems is negative. ? ?PAST MEDICAL HISTORY: ?Past Medical History:  ?Diagnosis Date  ? Anxiety   ? due to Covid   ? Cardiac disease   ? d/t family history of cardiac issues  ? Depression   ? due to Covid   ? Diffuse cystic mastopathy   ? Family history of breast cancer   ? Family history of malignant neoplasm of breast   ? Family history of prostate cancer   ? Graves disease   ? In remission  ? History of kidney stones   ? history of stone in past  ? Hyperlipidemia   ? Hyperthyroidism   ? remission  ? Personal history of radiation therapy   ? left breast 2022  ? PONV (postoperative nausea and vomiting)   ? ? ?PAST SURGICAL HISTORY: ?Past Surgical History:  ?Procedure Laterality Date  ? BREAST BIOPSY Left 07/2011  ? aspiration cyst  ? BREAST BIOPSY Left 01/2020  ? Sclerosing intraductal papilloma with apocrine metaplasia. No atypia or malignancy.  ? BREAST BIOPSY Left 07/15/2020  ? [place] bx, heart marker, path pending  ? BREAST DUCTAL SYSTEM EXCISION Left 08/09/2020  ? Procedure: EXCISION DUCTAL SYSTEM BREAST, left intraductal papilloma;  Surgeon: [name], MD;  Location: [facility];  Service: General;  Laterality: Left;  ? BREAST EXCISIONAL BIOPSY Right   ? 2008 benign  ? BREAST LUMPECTOMY Left 09/13/2020  ? Procedure: BREAST LUMPECTOMY, re-excision;  Surgeon: [name], MD;  Location: [facility];  Service: General;  Laterality: Left;  ? BREAST LUMPECTOMY Left 10/08/2020  ? Procedure: BREAST LUMPECTOMY, re-excision;  Surgeon: [name], MD;  Location: [facility];  Service: General;  Laterality: Left;  ? BREAST LUMPECTOMY,RADIO FREQ [place] Left 08/09/2020  ?  Procedure: BREAST LUMPECTOMY,RADIO FREQ LOCALIZER,AXILLARY SENTINEL LYMPH NODE BIOPSY;  Surgeon: [name], MD;  Location: [facility];  Service: General;  Laterality: Left;  ? COLONOSCOPY WITH PROPOFOL N/A 07/12/2020  ? Procedure: COLONOSCOPY WITH PROPOFOL;  Surgeon: Lucilla Lame, MD;   Location: [place];  Service: Endoscopy;  Laterality: N/A;  PRIORITY 4  ? DILATION AND CURETTAGE OF UTERUS  2011  ? eyelid surgery     ? ORBITAL RECONSTRUCTION Left 2008  ? graves disease caused "bug eyed"  ? POLYPECTOMY  07/12/2020  ? Procedure: POLYPECTOMY;  Surgeon: Lucilla Lame, MD;  Location: [place];  Service: Endoscopy;;  ? WISDOM TOOTH EXTRACTION    ? ? ?FAMILY HISTORY: ?Family History  ?Problem Relation Age of Onset  ? Diabetes Mother   ? Hypertension Mother   ? Hyperlipidemia Mother   ? Hypertension Father   ? Hyperlipidemia Father   ? Hypothyroidism Father   ? Prostate cancer Father 43  ? Hyperlipidemia Sister   ? Hypertension Sister   ? Hyperlipidemia Sister   ? Hypertension Sister   ? Diabetes Sister   ? Hyperlipidemia Sister   ? Hypertension Sister   ? Breast cancer Maternal Grandmother 35  ? ? ?ADVANCED DIRECTIVES (Y/N):  N ? ?HEALTH MAINTENANCE: ?Social History  ? ?Tobacco Use  ? Smoking status: Never  ? Smokeless tobacco: Never  ?Vaping Use  ? Vaping Use: Never used  ?Substance Use Topics  ? Alcohol use: Yes  ?  Comment: very rarely (maybe once a year)  ? Drug use: No  ? ? ? Colonoscopy: ? PAP: ? Bone density: ? Lipid panel: ? ?Allergies  ?Allergen Reactions  ? Polytrim [Polymyxin B-Trimethoprim] Rash  ?  Was used on eyes after surgery which gave her a rash   ? ? ?Current Outpatient Medications  ?Medication Sig Dispense Refill  ? acetaminophen (TYLENOL) 650 MG CR tablet Take 1,300 mg by mouth every 8 (eight) hours as needed for pain.    ? Artificial Tear Solution (TEARS NATURALE OP) Place 1 drop into both eyes daily.    ? Calcium Carbonate-Vitamin D (CALCIUM 600+D PO) Take 2 tablets by mouth daily.    ? CRANBERRY PO Take 2 tablets by mouth daily.    ? FIBER PO Take 1 capsule by mouth daily. Gummy    ? fish oil-omega-3 fatty acids 1000 MG capsule Take 2 g by mouth daily.    ? Garlic Oil 4098 MG CAPS Take 1,000 mg by mouth daily.    ? Ibuprofen 200 MG CAPS Take 4 capsules (800  mg total) by mouth every 6 (six) hours as needed (pain, fever, headache). (Patient taking differently: Take 400 mg by mouth every 6 (six) hours as needed (pain, fever, headache).) 120 capsule 0  ? Ibuprofen-diphenhydrAMINE HCl (ADVIL PM) 200-25 MG CAPS Take 1 tablet by mouth at bedtime as needed (sleep).    ? letrozole (FEMARA) 2.5 MG tablet TAKE 1 TABLET(2.5 MG) BY MOUTH DAILY 30 tablet 1  ? loratadine (CLARITIN) 10 MG tablet Take 10 mg by mouth daily as needed for allergies.    ? Multiple Vitamin (MULTIVITAMIN WITH MINERALS) TABS tablet Take 1 tablet by mouth daily. Women's One A Day    ? Saccharomyces boulardii (PROBIOTIC) 250 MG CAPS     ? vitamin B-12 (CYANOCOBALAMIN) 1000 MCG tablet Take 1,000 mcg by mouth daily.    ? White Petrolatum-Mineral Oil (GENTEAL TEARS NIGHT-TIME) OINT Apply 1 drop to eye at bedtime.    ? ?No current  facility-administered medications for this visit.  ? ? ?OBJECTIVE: ?Vitals:  ? 09/22/21 1106  ?BP: 132/78  ?Pulse: 75  ?Resp: 16  ?Temp: 97.9 ?F (36.6 ?C)  ?SpO2: 99%  ? ?   Body mass index is 31.5 kg/m?Marland Kitchen    ECOG FS:0 - Asymptomatic ? ?General: Well-developed, well-nourished, no acute distress. ?Eyes: Pink conjunctiva, anicteric sclera. ?HEENT: Normocephalic, moist mucous membranes. ?Breast: Exam deferred today. ?Lungs: No audible wheezing or coughing. ?Heart: Regular rate and rhythm. ?Abdomen: Soft, nontender, no obvious distention. ?Musculoskeletal: No edema, cyanosis, or clubbing. ?Neuro: Alert, answering all questions appropriately. Cranial nerves grossly intact. ?Skin: No rashes or petechiae noted. ?Psych: Normal affect. ? ? ?LAB RESULTS: ? ?Lab Results  ?Component Value Date  ? NA 141 05/20/2021  ? K 4.7 05/20/2021  ? CL 102 05/20/2021  ? CO2 24 05/20/2021  ? GLUCOSE 98 05/20/2021  ? BUN 14 05/20/2021  ? CREATININE 0.61 05/20/2021  ? CALCIUM 10.3 (H) 05/20/2021  ? PROT 7.2 05/20/2021  ? ALBUMIN 5.1 (H) 05/20/2021  ? AST 19 05/20/2021  ? ALT 22 05/20/2021  ? ALKPHOS 124 (H)  05/20/2021  ? BILITOT 0.3 05/20/2021  ? GFRNONAA 110 05/18/2020  ? GFRAA 126 05/18/2020  ? ? ?Lab Results  ?Component Value Date  ? WBC 7.9 12/15/2020  ? NEUTROABS 9.0 (H) 05/18/2020  ? HGB 12.5 12/15/2020  ? HCT 38.8

## 2021-09-22 ENCOUNTER — Inpatient Hospital Stay: Payer: Managed Care, Other (non HMO) | Attending: Oncology | Admitting: Oncology

## 2021-09-22 VITALS — BP 132/78 | HR 75 | Temp 97.9°F | Resp 16 | Ht 64.0 in | Wt 183.5 lb

## 2021-09-22 DIAGNOSIS — C50212 Malignant neoplasm of upper-inner quadrant of left female breast: Secondary | ICD-10-CM

## 2021-09-22 DIAGNOSIS — Z8349 Family history of other endocrine, nutritional and metabolic diseases: Secondary | ICD-10-CM | POA: Diagnosis not present

## 2021-09-22 DIAGNOSIS — Z881 Allergy status to other antibiotic agents status: Secondary | ICD-10-CM | POA: Insufficient documentation

## 2021-09-22 DIAGNOSIS — Z833 Family history of diabetes mellitus: Secondary | ICD-10-CM | POA: Insufficient documentation

## 2021-09-22 DIAGNOSIS — Z923 Personal history of irradiation: Secondary | ICD-10-CM | POA: Insufficient documentation

## 2021-09-22 DIAGNOSIS — Z170 Estrogen receptor positive status [ER+]: Secondary | ICD-10-CM | POA: Insufficient documentation

## 2021-09-22 DIAGNOSIS — Z8616 Personal history of COVID-19: Secondary | ICD-10-CM | POA: Insufficient documentation

## 2021-09-22 DIAGNOSIS — Z8042 Family history of malignant neoplasm of prostate: Secondary | ICD-10-CM | POA: Insufficient documentation

## 2021-09-22 DIAGNOSIS — Z87442 Personal history of urinary calculi: Secondary | ICD-10-CM | POA: Insufficient documentation

## 2021-09-22 DIAGNOSIS — Z79811 Long term (current) use of aromatase inhibitors: Secondary | ICD-10-CM | POA: Diagnosis not present

## 2021-09-22 DIAGNOSIS — Z803 Family history of malignant neoplasm of breast: Secondary | ICD-10-CM | POA: Diagnosis not present

## 2021-09-22 DIAGNOSIS — Z79899 Other long term (current) drug therapy: Secondary | ICD-10-CM | POA: Diagnosis not present

## 2021-09-22 DIAGNOSIS — Z8249 Family history of ischemic heart disease and other diseases of the circulatory system: Secondary | ICD-10-CM | POA: Diagnosis not present

## 2021-10-03 ENCOUNTER — Encounter: Payer: Self-pay | Admitting: Oncology

## 2021-10-10 ENCOUNTER — Encounter: Payer: Self-pay | Admitting: Oncology

## 2021-10-23 ENCOUNTER — Other Ambulatory Visit: Payer: Self-pay | Admitting: Oncology

## 2021-11-21 ENCOUNTER — Encounter: Payer: Self-pay | Admitting: Oncology

## 2022-02-09 ENCOUNTER — Encounter: Payer: Self-pay | Admitting: Radiation Oncology

## 2022-02-09 ENCOUNTER — Ambulatory Visit
Admission: RE | Admit: 2022-02-09 | Discharge: 2022-02-09 | Disposition: A | Discharge status: Home / Self Care | Payer: Managed Care, Other (non HMO) | Source: Ambulatory Visit | Attending: Radiation Oncology | Admitting: Radiation Oncology

## 2022-02-09 VITALS — BP 129/75 | HR 72 | Temp 98.3°F | Resp 16 | Ht 64.0 in | Wt 187.9 lb

## 2022-02-09 DIAGNOSIS — C50212 Malignant neoplasm of upper-inner quadrant of left female breast: Secondary | ICD-10-CM

## 2022-02-09 DIAGNOSIS — Z170 Estrogen receptor positive status [ER+]: Secondary | ICD-10-CM

## 2022-02-09 NOTE — Progress Notes (Signed)
Radiation Oncology Follow up Note  Name: Beth [name]   Date:   02/09/2022 MRN:  [id number] DOB: [birth year]    This 51 y.o. 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  Seen today in routine follow-up she is doing well still having some stiffness in her left shoulder l lymphedema clinic is asked her to restrict exercise with that arm.  She had mammograms back in February which I reviewed BI-RADS 2 benign.  She is currently on letrozole tolerating it well without side effect.  COMPLICATIONS OF TREATMENT: none  FOLLOW UP COMPLIANCE: keeps appointments   PHYSICAL EXAM:  BP 129/75   Pulse 72   Temp 98.3 F (36.8 C)   Resp 16   Ht '5\' 4"'$  (1.626 m)   Wt 187 lb 14.4 oz (85.2 kg)   BMI 32.25 kg/m  Lungs are clear to A&P cardiac examination essentially unremarkable with regular rate and rhythm. No dominant mass or nodularity is noted in either breast in 2 positions examined. Incision is well-healed. No axillary or supraclavicular adenopathy is appreciated. Cosmetic result is excellent.  Well-developed well-nourished patient in NAD. HEENT reveals PERLA, EOMI, discs not visualized.  Oral cavity is clear. No oral mucosal lesions are identified. Neck is clear without evidence of cervical or supraclavicular adenopathy. Lungs are clear to A&P. Cardiac examination is essentially unremarkable with regular rate and rhythm without murmur rub or thrill. Abdomen is benign with no organomegaly or masses noted.  Motor sensory and DTR levels are equal and symmetric in the upper and lower extremities. Cranial nerves II through XII are grossly intact. Proprioception is intact. No peripheral adenopathy or edema is identified. No motor or sensory levels are noted. Crude visual fields are within normal range.  RADIOLOGY RESULTS: Mammograms reviewed compatible with above-stated findings  PLAN: Present time patient is now a year out doing well with no evidence of disease.  I think after a year out I would lift all restrictions on exercise of her left upper extremity to help with some of the decreased mobility she is experiencing.  I have otherwise asked to see her back in 1 year for follow-up.  She continues on letrozole without side effect.  Patient knows to call with any concerns.  I would like to take this opportunity to thank you for allowing me to participate in the care of your patient.Noreene Filbert, MD

## 2022-03-24 ENCOUNTER — Other Ambulatory Visit: Payer: Self-pay | Admitting: Medical Oncology

## 2022-03-24 ENCOUNTER — Inpatient Hospital Stay: Payer: Managed Care, Other (non HMO) | Attending: Oncology | Admitting: Medical Oncology

## 2022-03-24 ENCOUNTER — Encounter: Payer: Self-pay | Admitting: Medical Oncology

## 2022-03-24 VITALS — BP 138/89 | HR 74 | Temp 97.5°F | Resp 16 | Wt 190.0 lb

## 2022-03-24 DIAGNOSIS — Z8616 Personal history of COVID-19: Secondary | ICD-10-CM | POA: Diagnosis not present

## 2022-03-24 DIAGNOSIS — Z923 Personal history of irradiation: Secondary | ICD-10-CM | POA: Insufficient documentation

## 2022-03-24 DIAGNOSIS — Z803 Family history of malignant neoplasm of breast: Secondary | ICD-10-CM | POA: Insufficient documentation

## 2022-03-24 DIAGNOSIS — C50212 Malignant neoplasm of upper-inner quadrant of left female breast: Secondary | ICD-10-CM | POA: Diagnosis present

## 2022-03-24 DIAGNOSIS — Z8349 Family history of other endocrine, nutritional and metabolic diseases: Secondary | ICD-10-CM | POA: Insufficient documentation

## 2022-03-24 DIAGNOSIS — Z87442 Personal history of urinary calculi: Secondary | ICD-10-CM | POA: Diagnosis not present

## 2022-03-24 DIAGNOSIS — Z833 Family history of diabetes mellitus: Secondary | ICD-10-CM | POA: Diagnosis not present

## 2022-03-24 DIAGNOSIS — Z8042 Family history of malignant neoplasm of prostate: Secondary | ICD-10-CM | POA: Insufficient documentation

## 2022-03-24 DIAGNOSIS — Z79811 Long term (current) use of aromatase inhibitors: Secondary | ICD-10-CM | POA: Diagnosis not present

## 2022-03-24 DIAGNOSIS — Z170 Estrogen receptor positive status [ER+]: Secondary | ICD-10-CM | POA: Diagnosis not present

## 2022-03-24 DIAGNOSIS — Z8249 Family history of ischemic heart disease and other diseases of the circulatory system: Secondary | ICD-10-CM | POA: Diagnosis not present

## 2022-03-24 DIAGNOSIS — Z881 Allergy status to other antibiotic agents status: Secondary | ICD-10-CM | POA: Diagnosis not present

## 2022-03-24 DIAGNOSIS — N951 Menopausal and female climacteric states: Secondary | ICD-10-CM

## 2022-03-24 NOTE — Progress Notes (Signed)
[facility]  [phone] Fax:(336) [phone]  ID: Beth [name] OB: June 28, 1970  MR#: [id number]  AVW#:[id number]  Patient Care Team: Virginia Crews, MD as PCP - General (Family Medicine) Christene Lye, MD (General Surgery) Gae Dry, MD as Referring Physician (Obstetrics and Gynecology) Theodore Demark, RN (Inactive) as Oncology Nurse Navigator Noreene Filbert, MD as Referring Physician (Radiation Oncology) Lloyd Huger, MD as Consulting Physician (Oncology) [name], MD as Consulting Physician (General Surgery)  CHIEF COMPLAINT: Pathologic stage Ia ER/PR positive, 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  Oncotype DX score 3.  INTERVAL HISTORY: Patient returns to clinic today for routine 107-monthevaluation. She presents today with her husband. Her only concern today is that her periods have restarted. Prior to being on Letrozole she was having regular menstrual cycles. When she started the Letrozole she did not have a menstrual cycle for roughly 9 months. In April they restarted. Cycles last about 3-4 days and then resolve for 1 month. She denies any other side effects. No nipple discharge, masses, lump/bumps.    She currently feels well and is asymptomatic.  She is tolerating letrozole without significant side effects.  She has no neurologic complaints.  She denies any recent fevers or illnesses.  She has a good appetite and denies weight loss.  She has no chest pain, shortness of breath, cough, or hemoptysis.  She denies any nausea, vomiting, constipation, or diarrhea.  She has no urinary complaints.  Patient offers no specific complaints today.  REVIEW OF SYSTEMS:   Review of Systems  Constitutional: Negative.  Negative for fever, malaise/fatigue and weight loss.  Respiratory: Negative.  Negative for cough, hemoptysis and shortness of breath.   Cardiovascular: Negative.  Negative  for chest pain and leg swelling.  Gastrointestinal: Negative.  Negative for abdominal pain.  Genitourinary: Negative.  Negative for dysuria.  Musculoskeletal: Negative.  Negative for back pain.  Skin: Negative.  Negative for rash.  Neurological: Negative.  Negative for dizziness, focal weakness and weakness.  Psychiatric/Behavioral: Negative.  The patient is not nervous/anxious.     As per HPI. Otherwise, a complete review of systems is negative.  PAST MEDICAL HISTORY: Past Medical History:  Diagnosis Date   Anxiety    due to Covid    Cardiac disease    d/t family history of cardiac issues   Depression    due to Covid    Diffuse cystic mastopathy    Family history of breast cancer    Family history of malignant neoplasm of breast    Family history of prostate cancer    Graves disease    In remission   History of kidney stones    history of stone in past   Hyperlipidemia    Hyperthyroidism    remission   Personal history of radiation therapy    left breast 2022   PONV (postoperative nausea and vomiting)     PAST SURGICAL HISTORY: Past Surgical History:  Procedure Laterality Date   BREAST BIOPSY Left 07/2011   aspiration cyst   BREAST BIOPSY Left 01/2020   Sclerosing intraductal papilloma with apocrine metaplasia. No atypia or malignancy.   BREAST BIOPSY Left 07/15/2020   uKoreabx, heart marker, path pending   BREAST DUCTAL SYSTEM EXCISION Left 08/09/2020   Procedure: EXCISION DUCTAL SYSTEM BREAST, left intraductal papilloma;  Surgeon: CFredirick Maudlin MD;  Location: [facility];  Service: General;  Laterality: Left;   BREAST EXCISIONAL  BIOPSY Right    2008 benign   BREAST LUMPECTOMY Left 09/13/2020   Procedure: BREAST LUMPECTOMY, re-excision;  Surgeon: [name], MD;  Location: [facility];  Service: General;  Laterality: Left;   BREAST LUMPECTOMY Left 10/08/2020   Procedure: BREAST LUMPECTOMY, re-excision;  Surgeon: [name], MD;  Location: [facility];   Service: General;  Laterality: Left;   BREAST LUMPECTOMY,RADIO FREQ [place] BIOPSY Left 08/09/2020   Procedure: BREAST LUMPECTOMY,RADIO FREQ LOCALIZER,AXILLARY SENTINEL LYMPH NODE BIOPSY;  Surgeon: [name], MD;  Location: [facility];  Service: General;  Laterality: Left;   COLONOSCOPY WITH PROPOFOL N/A 07/12/2020   Procedure: COLONOSCOPY WITH PROPOFOL;  Surgeon: Lucilla Lame, MD;  Location: [facility];  Service: Endoscopy;  Laterality: N/A;  PRIORITY [place] OF UTERUS  2011   eyelid surgery      ORBITAL RECONSTRUCTION Left 2008   graves disease caused "bug eyed"   POLYPECTOMY  07/12/2020   Procedure: POLYPECTOMY;  Surgeon: Lucilla Lame, MD;  Location: [place];  Service: Endoscopy;;   WISDOM TOOTH EXTRACTION      FAMILY HISTORY: Family History  Problem Relation Age of Onset   Diabetes Mother    Hypertension Mother    Hyperlipidemia Mother    Hypertension Father    Hyperlipidemia Father    Hypothyroidism Father    Prostate cancer Father 54   Hyperlipidemia Sister    Hypertension Sister    Hyperlipidemia Sister    Hypertension Sister    Diabetes Sister    Hyperlipidemia Sister    Hypertension Sister    Breast cancer Maternal Grandmother 51    ADVANCED DIRECTIVES (Y/N):  N  HEALTH MAINTENANCE: Social History   Tobacco Use   Smoking status: Never   Smokeless tobacco: Never  Vaping Use   Vaping Use: Never used  Substance Use Topics   Alcohol use: Yes    Comment: very rarely (maybe once a year)   Drug use: No     Colonoscopy:  PAP:  Bone density:  Lipid panel:  Allergies  Allergen Reactions   Polytrim [Polymyxin B-Trimethoprim] Rash    Was used on eyes after surgery which gave her a rash     Current Outpatient Medications  Medication Sig Dispense Refill   acetaminophen (TYLENOL) 650 MG CR tablet Take 1,300 mg by mouth every 8 (eight) hours as needed for pain.     Artificial Tear  Solution (TEARS NATURALE OP) Place 1 drop into both eyes daily.     Calcium Carbonate-Vitamin D (CALCIUM 600+D PO) Take 2 tablets by mouth daily.     CRANBERRY PO Take 2 tablets by mouth daily.     FIBER PO Take 1 capsule by mouth daily. Gummy     fish oil-omega-3 fatty acids 1000 MG capsule Take 2 g by mouth daily.     Garlic Oil 6384 MG CAPS Take 1,000 mg by mouth daily.     Ibuprofen 200 MG CAPS Take 4 capsules (800 mg total) by mouth every 6 (six) hours as needed (pain, fever, headache). (Patient taking differently: Take 400 mg by mouth every 6 (six) hours as needed (pain, fever, headache).) 120 capsule 0   Ibuprofen-diphenhydrAMINE HCl (ADVIL PM) 200-25 MG CAPS Take 1 tablet by mouth at bedtime as needed (sleep).     letrozole (FEMARA) 2.5 MG tablet TAKE 1 TABLET(2.5 MG) BY MOUTH DAILY 30 tablet 1   loratadine (CLARITIN) 10 MG tablet Take 10 mg by mouth daily as  needed for allergies.     Multiple Vitamin (MULTIVITAMIN WITH MINERALS) TABS tablet Take 1 tablet by mouth daily. Women's One A Day     Saccharomyces boulardii (PROBIOTIC) 250 MG CAPS      vitamin B-12 (CYANOCOBALAMIN) 1000 MCG tablet Take 1,000 mcg by mouth daily.     White Petrolatum-Mineral Oil (GENTEAL TEARS NIGHT-TIME) OINT Apply 1 drop to eye at bedtime.     No current facility-administered medications for this visit.    OBJECTIVE: Vitals:   03/24/22 1032  BP: 138/89  Pulse: 74  Resp: 16  Temp: (!) 97.5 F (36.4 C)  SpO2: 100%      Body mass index is 32.61 kg/m.    ECOG FS:0 - Asymptomatic  General: Well-developed, well-nourished, no acute distress. Eyes: Pink conjunctiva, anicteric sclera. HEENT: Normocephalic, moist mucous membranes. Breast: Left breast s/p lumpectomy with post radiation chronic mild dermatitis. No palpable masses. No adenopathy. Right breast s/p lumpectomy without any nipple discharge, axillary adenopathy.  Lungs: No audible wheezing or coughing. Heart: Regular rate and rhythm. Abdomen:  Soft, nontender, no obvious distention. Musculoskeletal: No edema, cyanosis, or clubbing. Neuro: Alert, answering all questions appropriately. Cranial nerves grossly intact. Skin: No rashes or petechiae noted. Psych: Normal affect.   LAB RESULTS:  Lab Results  Component Value Date   NA 141 05/20/2021   K 4.7 05/20/2021   CL 102 05/20/2021   CO2 24 05/20/2021   GLUCOSE 98 05/20/2021   BUN 14 05/20/2021   CREATININE 0.61 05/20/2021   CALCIUM 10.3 (H) 05/20/2021   PROT 7.2 05/20/2021   ALBUMIN 5.1 (H) 05/20/2021   AST 19 05/20/2021   ALT 22 05/20/2021   ALKPHOS 124 (H) 05/20/2021   BILITOT 0.3 05/20/2021   GFRNONAA 110 05/18/2020   GFRAA 126 05/18/2020    Lab Results  Component Value Date   WBC 7.9 12/15/2020   NEUTROABS 9.0 (H) 05/18/2020   HGB 12.5 12/15/2020   HCT 38.8 12/15/2020   MCV 90.4 12/15/2020   PLT 363 12/15/2020     STUDIES: No results found.  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  Oncotype DX score 3.   PLAN:    1. 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  Patient required 2 reexcision's in order to have positive margins the final 1 on October 08, 2020.  Her Oncotype score was 3 conferring low risk, therefore chemotherapy was not necessary despite her 3+ lymph nodes.  Patient completed adjuvant XRT in July 2022.  Continue letrozole for a total of 5 years completing treatment in July 2027, although given her node positive disease can consider extending treatment 7 to 10 years.  Her most recent mammogram on August 04, 2021 was reported as BI-RADS 2, repeat in February 2024.  Today breast exam is unremarkable and she has no clinical sign of recurrence.  Return to clinic in 6 months for routine evaluation.   2.  Genetic testing: Negative. 3.  Bone health: Bone mineral density on January 05, 2021 was reported as normal.  No change in our plan to repeat in July 2024. 4. Return of menses: Not post menopausal bleeding - she is perimenopausal. However should this become irregular or fail to resolve within 1 year I  would recommend GYN evaluation. Certainly GYN follow up sooner should patient desire.   I spent a total of 20 minutes reviewing chart data, face-to-face evaluation with the patient, counseling and coordination of care as detailed above.   Patient expressed understanding and was in agreement with this plan. She also understands that She can call clinic at any time with any questions, concerns, or complaints.    Cancer Staging  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, left [facility]) Staging form: Breast, AJCC 8th Edition - Pathologic stage from 08/25/2020: Stage IA (pT1c, pN1a, cM0, G1, ER+, PR+, HER2-) - Signed by Lloyd Huger, MD on 08/25/2020 Stage prefix: Initial diagnosis Histologic grading system: 3 grade system   Hughie Closs, PA-C   03/24/2022 2:14 PM

## 2022-03-24 NOTE — Progress Notes (Signed)
Pt and husband in for follow up, pt denies any concerns today.

## 2022-04-17 ENCOUNTER — Encounter: Payer: Self-pay | Admitting: Advanced Practice Midwife

## 2022-04-17 ENCOUNTER — Ambulatory Visit (INDEPENDENT_AMBULATORY_CARE_PROVIDER_SITE_OTHER): Payer: Managed Care, Other (non HMO) | Admitting: Advanced Practice Midwife

## 2022-04-17 VITALS — BP 146/72 | HR 83 | Ht 64.0 in | Wt 193.0 lb

## 2022-04-17 DIAGNOSIS — Z01419 Encounter for gynecological examination (general) (routine) without abnormal findings: Secondary | ICD-10-CM | POA: Diagnosis not present

## 2022-04-17 DIAGNOSIS — Z0000 Encounter for general adult medical examination without abnormal findings: Secondary | ICD-10-CM

## 2022-04-17 NOTE — Progress Notes (Signed)
Gynecology Annual Exam  PCP: Virginia Crews, MD  Chief Complaint:  Chief Complaint  Patient presents with   Annual Exam    History of Present Illness:Patient is a 51 y.o. G1P1 presents for annual exam. The patient has no complaints today. She reports having irregular periods since April of this year. She had a mole removed recently from her left mid back.  LMP: Patient's last menstrual period was 03/10/2022.  Average Interval: irregular,  every 1-2  months Duration of flow:  4-5  days Heavy Menses: middle of period Clots: no Intermenstrual Bleeding: no Postcoital Bleeding: no Dysmenorrhea: no  The patient is sexually active. She denies dyspareunia.  The patient does perform self breast exams.  The patient has a history of breast cancer.  The patient wears seatbelts: yes.   The patient has regular exercise:  she has minimal exercise, she tries to eat a healthy diet and drinks mainly water and some soft drinks .  She is taking a calcium/vitamin D supplement.  The patient denies current symptoms of depression.     Review of Systems: Review of Systems  Constitutional:  Positive for malaise/fatigue. Negative for chills and fever.  HENT:  Negative for congestion, ear discharge, ear pain, hearing loss, sinus pain and sore throat.   Eyes:  Negative for blurred vision and double vision.  Respiratory:  Negative for cough, shortness of breath and wheezing.   Cardiovascular:  Negative for chest pain, palpitations and leg swelling.  Gastrointestinal:  Negative for abdominal pain, blood in stool, constipation, diarrhea, heartburn, melena, nausea and vomiting.  Genitourinary:  Negative for dysuria, flank pain, frequency, hematuria and urgency.  Musculoskeletal:  Negative for back pain, joint pain and myalgias.  Skin:  Negative for itching and rash.  Neurological:  Negative for dizziness, tingling, tremors, sensory change, speech change, focal weakness, seizures, loss of  consciousness, weakness and headaches.  Endo/Heme/Allergies:  Negative for environmental allergies. Does not bruise/bleed easily.  Psychiatric/Behavioral:  Negative for depression, hallucinations, memory loss, substance abuse and suicidal ideas. The patient is not nervous/anxious and does not have insomnia.     Past Medical History:  Patient Active Problem List   Diagnosis Date Noted   Dupuytren's disease of palm of right hand 05/20/2021   Genetic testing 09/23/2020    Negative genetic testing. No pathogenic variants identified on the [facility] CancerNext-Expanded+RNA panel. The report date is 09/23/2020.  The CancerNext-Expanded + RNAinsight gene panel offered by [employer] and includes sequencing and rearrangement analysis for the following 77 genes: IP, ALK, APC*, ATM*, AXIN2, BAP1, BARD1, BLM, BMPR1A, BRCA1*, BRCA2*, BRIP1*, CDC73, CDH1*,CDK4, CDKN1B, CDKN2A, CHEK2*, CTNNA1, DICER1, FANCC, FH, FLCN, GALNT12, KIF1B, LZTR1, MAX, MEN1, MET, MLH1*, MSH2*, MSH3, MSH6*, MUTYH*, NBN, NF1*, NF2, NTHL1, PALB2*, PHOX2B, PMS2*, POT1, PRKAR1A, PTCH1, PTEN*, RAD51C*, RAD51D*,RB1, RECQL, RET, SDHA, SDHAF2, SDHB, SDHC, SDHD, SMAD4, SMARCA4, SMARCB1, SMARCE1, STK11, SUFU, TMEM127, TP53*,TSC1, TSC2, VHL and XRCC2 (sequencing and deletion/duplication); EGFR, EGLN1, HOXB13, KIT, MITF, PDGFRA, POLD1 and POLE (sequencing only); EPCAM and GREM1 (deletion/duplication only).     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 No atypia or malignancy.   BREAST BIOPSY Left 07/15/2020   [place] bx, heart marker, path pending   BREAST DUCTAL SYSTEM EXCISION Left 08/09/2020   Procedure: EXCISION DUCTAL SYSTEM BREAST, left intraductal papilloma;  Surgeon: [name], MD;  Location: [facility];  Service: General;  Laterality: Left;   BREAST EXCISIONAL BIOPSY Right    2008 benign   BREAST LUMPECTOMY Left 09/13/2020   Procedure: BREAST LUMPECTOMY, re-excision;  Surgeon: [name], MD;  Location: [facility];  Service: General;  Laterality: Left;   BREAST LUMPECTOMY Left 10/08/2020   Procedure: BREAST LUMPECTOMY, re-excision;  Surgeon: [name], MD;  Location: [facility];  Service: General;  Laterality: Left;   BREAST LUMPECTOMY,RADIO FREQ [place] BIOPSY Left 08/09/2020   Procedure: BREAST LUMPECTOMY,RADIO FREQ LOCALIZER,AXILLARY SENTINEL LYMPH NODE BIOPSY;  Surgeon: [name], MD;  Location: [facility];  Service: General;  Laterality: Left;   COLONOSCOPY WITH PROPOFOL N/A 07/12/2020   Procedure: COLONOSCOPY WITH PROPOFOL;  Surgeon: Lucilla Lame, MD;  Location: [facility];  Service: Endoscopy;  Laterality: N/A;  PRIORITY Deputy OF UTERUS  2011   eyelid surgery      ORBITAL RECONSTRUCTION Left 2008   graves disease caused "bug eyed"   POLYPECTOMY  07/12/2020   Procedure: POLYPECTOMY;  Surgeon: Lucilla Lame, MD;  Location: [facility];  Service: Endoscopy;;   WISDOM TOOTH EXTRACTION      Gynecologic History:  Patient's last menstrual period was 03/10/2022. Last Pap: 2022 Results were:  no abnormalities  Last mammogram: 2023  Results were: BI-RAD II benign  Obstetric History: G1P1  Family History:  Family History  Problem Relation Age of Onset   Diabetes Mother    Hypertension Mother    Hyperlipidemia Mother    Hypertension Father    Hyperlipidemia Father    Hypothyroidism Father    Prostate cancer Father 3   Hyperlipidemia Sister    Hypertension Sister    Hyperlipidemia Sister    Hypertension Sister    Diabetes Sister    Hyperlipidemia Sister    Hypertension Sister    Breast cancer Maternal Grandmother 87    Social History:  Social History   Socioeconomic History   Marital status: Married    Spouse name: Event organiser   Number of children: Not on file   Years of education: Not on file   Highest education level: Not on file  Occupational History   Occupation: word processing    Comment: full time  Tobacco Use   Smoking status: Never   Smokeless tobacco: Never  Vaping Use   Vaping Use: Never used  Substance and Sexual Activity   Alcohol use: Yes    Comment: very rarely (maybe once a year)   Drug use: No   Sexual activity: Yes    Birth control/protection: None  Other Topics Concern   Not on file  Social History Narrative   Patient lives with husband.    She feels safe in her home.   Social Determinants of Health   Financial Resource Strain: Not on  file  Food Insecurity: Not on file  Transportation Needs: Not on file  Physical Activity: Not on file  Stress: Not on file  Social Connections: Not on file  Intimate Partner Violence: Not on file    Allergies:  Allergies  Allergen Reactions   Polytrim [Polymyxin B-Trimethoprim] Rash    Was used on eyes after surgery which gave her a rash     Medications: Prior to Admission medications   Medication Sig Start Date End Date Taking? Authorizing Provider  acetaminophen (TYLENOL) 650 MG CR tablet Take 1,300 mg by mouth every 8 (eight) hours as needed for pain. 12/19/19  Yes [provider]  Artificial Tear Solution (TEARS  NATURALE OP) Place 1 drop into both eyes daily.   Yes [provider]  Calcium Carbonate-Vitamin D (CALCIUM 600+D PO) Take 2 tablets by mouth daily.   Yes [provider]  CRANBERRY PO Take 2 tablets by mouth daily.   Yes [provider]  FIBER PO Take 1 capsule by mouth daily. Gummy   Yes [provider]  fish oil-omega-3 fatty acids 1000 MG capsule Take 2 g by mouth daily.   Yes [provider]  Garlic Oil 5409 MG CAPS Take 1,000 mg by mouth daily. 06/19/16  Yes [provider]  Ibuprofen 200 MG CAPS Take 4 capsules (800 mg total) by mouth every 6 (six) hours as needed (pain, fever, headache). Patient taking differently: Take 400 mg by mouth every 6 (six) hours as needed (pain, fever, headache). 08/09/20  Yes [name], MD  Ibuprofen-diphenhydrAMINE HCl (ADVIL PM) 200-25 MG CAPS Take 1 tablet by mouth at bedtime as needed (sleep).   Yes [provider]  letrozole (FEMARA) 2.5 MG tablet TAKE 1 TABLET(2.5 MG) BY MOUTH DAILY 10/26/21  Yes Lloyd Huger, MD  loratadine (CLARITIN) 10 MG tablet Take 10 mg by mouth daily as needed for allergies.   Yes [provider]  Multiple Vitamin (MULTIVITAMIN WITH MINERALS) TABS tablet Take 1 tablet by mouth daily. Women's One A Day   Yes [provider]  Saccharomyces boulardii (PROBIOTIC) 250 MG CAPS  07/25/21  Yes [provider]  vitamin B-12 (CYANOCOBALAMIN) 1000 MCG tablet Take 1,000 mcg by mouth daily.   Yes [provider]  White Petrolatum-Mineral Oil (GENTEAL TEARS NIGHT-TIME) OINT Apply 1 drop to eye at bedtime.   Yes [provider]    Physical Exam Vitals: Blood pressure (!) 146/72, pulse 83, height _0  (1.626 m), weight 193 lb (87.5 kg), last menstrual period 03/10/2022.  General: NAD HEENT: normocephalic, anicteric Thyroid: no enlargement, no palpable nodules Pulmonary: No increased work of breathing, CTAB Cardiovascular: RRR,  distal pulses 2+ Breast: Breast symmetrical, no tenderness, no palpable nodules or masses, no skin or nipple retraction present, no nipple discharge.  No axillary or supraclavicular lymphadenopathy. Abdomen: NABS, soft, non-tender, non-distended.  Umbilicus without lesions.  No hepatomegaly, splenomegaly or masses palpable. No evidence of hernia  Genitourinary: deferred for no concerns/PAP interval Extremities: no edema, erythema, or tenderness Neurologic: Grossly intact Psychiatric: mood appropriate, affect full    Assessment: 51 y.o. G1P1 routine annual exam  Plan: Problem List Items Addressed This Visit   None Visit Diagnoses     Well woman exam without gynecological exam    -  Primary       1) Mammogram - recommend yearly screening mammogram.  Mammogram Is up to date  2) STI screening  was offered and declined  3) ASCCP guidelines and rationale  discussed.  Patient opts for every 5 years screening interval  4) Osteoporosis  - per USPTF routine screening DEXA at age 65  Consider FDA-approved medical therapies in postmenopausal women and men aged 92 years and older, based on the following: a) A hip or vertebral (clinical or morphometric) fracture b) T-score ? -2.5 at the femoral neck or spine after appropriate evaluation to exclude secondary causes C) Low bone mass (T-score between -1.0 and -2.5 at the femoral neck or spine) and a 10-year probability of a hip fracture ? 3% or a 10-year probability of a major osteoporosis-related fracture ? 20% based on the [place]-adapted WHO algorithm   5) Routine healthcare maintenance including cholesterol, diabetes screening discussed managed by PCP  6) Colonoscopy is up to date.  Screening recommended starting at age 33 for average risk individuals, age 72 for individuals deemed at increased risk (including African Americans) and recommended to continue until age 31.  For patient age 54-85 individualized approach is recommended.  Gold standard  screening is via colonoscopy, Cologuard screening is an acceptable alternative for patient unwilling or unable to undergo colonoscopy.  "Colorectal cancer screening for average?risk adults: 2018 guideline update from the [place]: A Cancer Journal for Clinicians: Nov 15, 2016   7) Return in about 1 year (around 04/18/2023) for annual established gyn.    Christean Leaf, CNM [facility] 04/17/22, 9:05 AM

## 2022-04-21 ENCOUNTER — Other Ambulatory Visit: Payer: Self-pay | Admitting: Oncology

## 2022-05-23 ENCOUNTER — Encounter: Payer: Managed Care, Other (non HMO) | Admitting: Family Medicine

## 2022-05-24 ENCOUNTER — Ambulatory Visit (INDEPENDENT_AMBULATORY_CARE_PROVIDER_SITE_OTHER): Payer: Managed Care, Other (non HMO) | Admitting: Family Medicine

## 2022-05-24 ENCOUNTER — Encounter: Payer: Self-pay | Admitting: Family Medicine

## 2022-05-24 VITALS — BP 138/80 | HR 81 | Wt 191.4 lb

## 2022-05-24 DIAGNOSIS — E669 Obesity, unspecified: Secondary | ICD-10-CM

## 2022-05-24 DIAGNOSIS — E0500 Thyrotoxicosis with diffuse goiter without thyrotoxic crisis or storm: Secondary | ICD-10-CM

## 2022-05-24 DIAGNOSIS — E7800 Pure hypercholesterolemia, unspecified: Secondary | ICD-10-CM

## 2022-05-24 DIAGNOSIS — C50912 Malignant neoplasm of unspecified site of left female breast: Secondary | ICD-10-CM | POA: Diagnosis not present

## 2022-05-24 DIAGNOSIS — R748 Abnormal levels of other serum enzymes: Secondary | ICD-10-CM

## 2022-05-24 DIAGNOSIS — Z6831 Body mass index (BMI) 31.0-31.9, adult: Secondary | ICD-10-CM

## 2022-05-24 DIAGNOSIS — R739 Hyperglycemia, unspecified: Secondary | ICD-10-CM

## 2022-05-24 DIAGNOSIS — Z23 Encounter for immunization: Secondary | ICD-10-CM

## 2022-05-24 DIAGNOSIS — Z0000 Encounter for general adult medical examination without abnormal findings: Secondary | ICD-10-CM

## 2022-05-24 NOTE — Progress Notes (Signed)
BP 138/80 (BP Location: Right Arm, Patient Position: Sitting, Cuff Size: Normal)   Pulse 81   Wt 191 lb 6.4 oz (86.8 kg)   SpO2 100%   BMI 32.85 kg/m    Subjective:    Patient ID: Beth [name], female    DOB: 1971/01/25, 51 y.o.   MRN: [id number]  HPI: Beth [name] is a 51 y.o. female presenting on 05/24/2022 for comprehensive medical examination. Current medical complaints include:none  H/o breast cancer - still on femara.   She currently lives with: husband Menopausal Symptoms: no  Depression Screen done today and results listed below:     05/24/2022    9:33 AM 05/20/2021    9:15 AM 05/18/2020    8:43 AM 04/28/2019    8:58 AM 04/24/2018    2:03 PM  Depression screen PHQ 2/9  Decreased Interest 0 0 0 1 0  Down, Depressed, Hopeless _0 0  PHQ - 2 Score _1 0  Altered sleeping 1  0 0   Tired, decreased energy _2 Change in appetite 0  0 0   Feeling bad or failure about yourself  0  0 0   Trouble concentrating 1  1 0   Moving slowly or fidgety/restless 0  0 0   Suicidal thoughts 0  0 0   PHQ-9 Score _3 Difficult doing work/chores Not difficult at all  Not difficult at all Not difficult at all      Past Medical History:  Past Medical History:  Diagnosis Date   Anxiety    due to Covid    Cardiac disease    d/t family history of cardiac issues   Depression    due to Covid    Diffuse cystic mastopathy    Family history of breast cancer    Family history of malignant neoplasm of breast    Family history of prostate cancer    Graves disease    In remission   History of kidney stones    history of stone in past   Hyperlipidemia    Hyperthyroidism    remission   Personal history of radiation therapy    left breast 2022   PONV (postoperative nausea and vomiting)     Surgical History:  Past Surgical History:  Procedure Laterality Date   BREAST BIOPSY Left 07/2011   aspiration cyst   BREAST BIOPSY Left 01/2020   Sclerosing intraductal  papilloma with apocrine metaplasia. No atypia or malignancy.   BREAST BIOPSY Left 07/15/2020   [place] bx, heart marker, path pending   BREAST DUCTAL SYSTEM EXCISION Left 08/09/2020   Procedure: EXCISION DUCTAL SYSTEM BREAST, left intraductal papilloma;  Surgeon: [name], MD;  Location: [facility];  Service: General;  Laterality: Left;   BREAST EXCISIONAL BIOPSY Right    2008 benign   BREAST LUMPECTOMY Left 09/13/2020   Procedure: BREAST LUMPECTOMY, re-excision;  Surgeon: [name], MD;  Location: [facility];  Service: General;  Laterality: Left;   BREAST LUMPECTOMY Left 10/08/2020   Procedure: BREAST LUMPECTOMY, re-excision;  Surgeon: [name], MD;  Location: [facility];  Service: General;  Laterality: Left;   BREAST LUMPECTOMY,RADIO FREQ [place] BIOPSY Left 08/09/2020   Procedure: BREAST [place] LYMPH NODE BIOPSY;  Surgeon: [name], MD;  Location: [facility];  Service: General;  Laterality: Left;   COLONOSCOPY WITH PROPOFOL N/A 07/12/2020   Procedure:  COLONOSCOPY WITH PROPOFOL;  Surgeon: Lucilla Lame, MD;  Location: [place];  Service: Endoscopy;  Laterality: N/A;  PRIORITY [place] OF UTERUS  2011   eyelid surgery      ORBITAL RECONSTRUCTION Left 2008   graves disease caused "bug eyed"   POLYPECTOMY  07/12/2020   Procedure: POLYPECTOMY;  Surgeon: Lucilla Lame, MD;  Location: [place];  Service: Endoscopy;;   WISDOM TOOTH EXTRACTION      Medications:  Current Outpatient Medications on File Prior to Visit  Medication Sig   acetaminophen (TYLENOL) 650 MG CR tablet Take 1,300 mg by mouth every 8 (eight) hours as needed for pain.   Artificial Tear Solution (TEARS NATURALE OP) Place 1 drop into both eyes daily.   Calcium Carbonate-Vitamin D (CALCIUM 600+D PO) Take 2 tablets by mouth daily.   CRANBERRY PO Take 2 tablets by mouth daily.   FIBER PO Take 1  capsule by mouth daily. Gummy   fish oil-omega-3 fatty acids 1000 MG capsule Take 2 g by mouth daily.   Garlic Oil 1884 MG CAPS Take 1,000 mg by mouth daily.   Ibuprofen 200 MG CAPS Take 4 capsules (800 mg total) by mouth every 6 (six) hours as needed (pain, fever, headache). (Patient taking differently: Take 400 mg by mouth every 6 (six) hours as needed (pain, fever, headache).)   Ibuprofen-diphenhydrAMINE HCl (ADVIL PM) 200-25 MG CAPS Take 1 tablet by mouth at bedtime as needed (sleep).   letrozole (FEMARA) 2.5 MG tablet TAKE 1 TABLET(2.5 MG) BY MOUTH DAILY   loratadine (CLARITIN) 10 MG tablet Take 10 mg by mouth daily as needed for allergies.   Multiple Vitamin (MULTIVITAMIN WITH MINERALS) TABS tablet Take 1 tablet by mouth daily. Women's One A Day   Saccharomyces boulardii (PROBIOTIC) 250 MG CAPS    vitamin B-12 (CYANOCOBALAMIN) 1000 MCG tablet Take 1,000 mcg by mouth daily.   White Petrolatum-Mineral Oil (GENTEAL TEARS NIGHT-TIME) OINT Apply 1 drop to eye at bedtime.   No current facility-administered medications on file prior to visit.    Allergies:  Allergies  Allergen Reactions   Polytrim [Polymyxin B-Trimethoprim] Rash    Was used on eyes after surgery which gave her a rash     Social History:  Social History   Socioeconomic History   Marital status: Married    Spouse name: Event organiser   Number of children: Not on file   Years of education: Not on file   Highest education level: Not on file  Occupational History   Occupation: word processing    Comment: full time  Tobacco Use   Smoking status: Never   Smokeless tobacco: Never  Vaping Use   Vaping Use: Never used  Substance and Sexual Activity   Alcohol use: Yes    Comment: very rarely (maybe once a year)   Drug use: No   Sexual activity: Yes    Birth control/protection: None  Other Topics Concern   Not on file  Social History Narrative   Patient lives with husband.    She feels safe in her home.   Social  Determinants of Health   Financial Resource Strain: Not on file  Food Insecurity: Not on file  Transportation Needs: Not on file  Physical Activity: Not on file  Stress: Not on file  Social Connections: Not on file  Intimate Partner Violence: Not on file   Social History   Tobacco Use  Smoking Status Never  Smokeless Tobacco Never  Social History   Substance and Sexual Activity  Alcohol Use Yes   Comment: very rarely (maybe once a year)    Family History:  Family History  Problem Relation Age of Onset   Diabetes Mother    Hypertension Mother    Hyperlipidemia Mother    Hypertension Father    Hyperlipidemia Father    Hypothyroidism Father    Prostate cancer Father 80   Hyperlipidemia Sister    Hypertension Sister    Hyperlipidemia Sister    Hypertension Sister    Diabetes Sister    Hyperlipidemia Sister    Hypertension Sister    Breast cancer Maternal Grandmother 87    Past medical history, surgical history, medications, allergies, family history and social history reviewed with patient today and changes made to appropriate areas of the chart.      Objective:    BP 138/80 (BP Location: Right Arm, Patient Position: Sitting, Cuff Size: Normal)   Pulse 81   Wt 191 lb 6.4 oz (86.8 kg)   SpO2 100%   BMI 32.85 kg/m   Wt Readings from Last 3 Encounters:  05/24/22 191 lb 6.4 oz (86.8 kg)  04/17/22 193 lb (87.5 kg)  03/24/22 190 lb (86.2 kg)    Physical Exam Constitutional:      Appearance: Normal appearance.  HENT:     Head: Normocephalic and atraumatic.     Right Ear: Tympanic membrane, ear canal and external ear normal.     Left Ear: Tympanic membrane, ear canal and external ear normal.     Nose: Nose normal.     Mouth/Throat:     Mouth: Mucous membranes are moist.     Pharynx: Oropharynx is clear.  Eyes:     Extraocular Movements: Extraocular movements intact.     Pupils: Pupils are equal, round, and reactive to light.  Cardiovascular:     Rate and  Rhythm: Normal rate and regular rhythm.     Heart sounds: Normal heart sounds. No murmur heard. Pulmonary:     Effort: Pulmonary effort is normal.     Breath sounds: Normal breath sounds.  Abdominal:     General: Bowel sounds are normal.     Palpations: Abdomen is soft.     Tenderness: There is no abdominal tenderness.  Musculoskeletal:        General: Normal range of motion.     Right lower leg: No edema.     Left lower leg: No edema.  Lymphadenopathy:     Cervical: No cervical adenopathy.  Skin:    General: Skin is warm and dry.  Neurological:     Mental Status: She is alert and oriented to person, place, and time. Mental status is at baseline.     Gait: Gait normal.  Psychiatric:        Mood and Affect: Mood normal.        Behavior: Behavior normal.     Results for orders placed or performed in visit on 05/20/21  Comprehensive metabolic panel  Result Value Ref Range   Glucose 98 70 - 99 mg/dL   BUN 14 6 - 24 mg/dL   Creatinine, Ser 0.61 0.57 - 1.00 mg/dL   eGFR 108 >59 mL/min/1.73   BUN/Creatinine Ratio 23 9 - 23   Sodium 141 134 - 144 mmol/L   Potassium 4.7 3.5 - 5.2 mmol/L   Chloride 102 96 - 106 mmol/L   CO2 24 20 - 29 mmol/L   Calcium 10.3 (H) 8.7 -  10.2 mg/dL   Total Protein 7.2 6.0 - 8.5 g/dL   Albumin 5.1 (H) 3.8 - 4.9 g/dL   Globulin, Total 2.1 1.5 - 4.5 g/dL   Albumin/Globulin Ratio 2.4 (H) 1.2 - 2.2   Bilirubin Total 0.3 0.0 - 1.2 mg/dL   Alkaline Phosphatase 124 (H) 44 - 121 IU/L   AST 19 0 - 40 IU/L   ALT 22 0 - 32 IU/L  Lipid panel  Result Value Ref Range   Cholesterol, Total 216 (H) 100 - 199 mg/dL   Triglycerides 59 0 - 149 mg/dL   HDL 67 >39 mg/dL   VLDL Cholesterol Cal 10 5 - 40 mg/dL   LDL Chol Calc (NIH) 139 (H) 0 - 99 mg/dL   Chol/HDL Ratio 3.2 0.0 - 4.4 ratio  Hemoglobin A1c  Result Value Ref Range   Hgb A1c MFr Bld 6.0 (H) 4.8 - 5.6 %   Est. average glucose Bld gHb Est-mCnc 126 mg/dL  TSH  Result Value Ref Range   TSH 0.583 0.450 -  4.500 uIU/mL      Assessment & Plan:   Problem List Items Addressed This Visit       Endocrine   Graves disease    H/o Graves. Not currently on meds. Recheck labs.       Relevant Orders   TSH     Other   Blood glucose elevated   Relevant Orders   HgB A1c   Lipid panel   Hypercholesteremia   Relevant Orders   Lipid panel   Comprehensive metabolic panel   Malignant neoplasm of left female breast (HCC)    F/b Onc. Continues on letrozole.       Obesity   Relevant Orders   HgB A1c   Lipid panel   Comprehensive metabolic panel   Other Visit Diagnoses     Annual physical exam    -  Primary   Relevant Orders   HgB A1c   Lipid panel   Comprehensive metabolic panel   CBC with Differential/Platelet        Follow up plan: Return in about 1 year (around 05/25/2023) for cpe.   LABORATORY TESTING:  - Pap smear: up to date  IMMUNIZATIONS:   - Tdap: Tetanus vaccination status reviewed: last tetanus booster within 10 years. - Influenza: Up to date - HPV: Not applicable - Shingrix vaccine: Administered today - COVID vaccine: has received 5 doses of mRNA vaccine  SCREENING: - Mammogram: Up to date  - Colonoscopy: Up to date  - Bone Density: ordered elsewhere  - Lung Cancer Screening: Not applicable   Hep C Screening: UTD STD testing and prevention (HIV/chl/gon/syphilis): no concerns Sexual History : Menstrual History/LMP/Abnormal Bleeding:  Incontinence Symptoms:   Osteoporosis: Discussed high calcium and vitamin D supplementation, weight bearing exercises  Advanced Care Planning: A voluntary discussion about advance care planning including the explanation and discussion of advance directives.  Discussed health care proxy and Living will, and the patient was able to identify a health care proxy as husband, Chloe Bluett.  Patient does not have a living will at present time. If patient does have living will, I have requested they bring this to the clinic to be  scanned in to their chart.  PATIENT COUNSELING:   Advised to take 1 mg of folate supplement per day if capable of pregnancy.   Sexuality: Discussed sexually transmitted diseases, partner selection, use of condoms, avoidance of unintended pregnancy  and contraceptive alternatives.   Advised to  avoid cigarette smoking.  I discussed with the patient that most people either abstain from alcohol or drink within safe limits (<=14/week and <=4 drinks/occasion for males, <=7/weeks and <= 3 drinks/occasion for females) and that the risk for alcohol disorders and other health effects rises proportionally with the number of drinks per week and how often a drinker exceeds daily limits.  Discussed cessation/primary prevention of drug use and availability of treatment for abuse.   Diet: Encouraged to adjust caloric intake to maintain  or achieve ideal body weight, to reduce intake of dietary saturated fat and total fat, to limit sodium intake by avoiding high sodium foods and not adding table salt, and to maintain adequate dietary potassium and calcium preferably from fresh fruits, vegetables, and low-fat dairy products.    stressed the importance of regular exercise  Injury prevention: Discussed safety belts, safety helmets, smoke detector, smoking near bedding or upholstery.   Dental health: Discussed importance of regular tooth brushing, flossing, and dental visits.    NEXT PREVENTATIVE PHYSICAL DUE IN 1 YEAR. Return in about 1 year (around 05/25/2023) for cpe.

## 2022-05-24 NOTE — Assessment & Plan Note (Signed)
H/o Graves. Not currently on meds. Recheck labs.

## 2022-05-24 NOTE — Assessment & Plan Note (Addendum)
F/b Onc. Continues on letrozole.

## 2022-05-24 NOTE — Patient Instructions (Signed)
Things to do to keep yourself healthy  - Exercise at least 30-45 minutes a day, 3-4 days a week.  - Eat a low-fat diet with lots of fruits and vegetables, up to 7-9 servings per day.  - Seatbelts can save your life. Wear them always.  - Smoke detectors on every level of your home, check batteries every year.  - Eye Doctor - have an eye exam every 1-2 years  - Safe sex - if you may be exposed to STDs, use a condom.  - Alcohol -  If you drink, do it moderately, less than 2 drinks per day.  - Health Care Power of Attorney. Choose someone to speak for you if you are not able. https://www.prepareforyourcare.org is a great website to help you navigate this. - Depression is common in our stressful world.If you're feeling down or losing interest in things you normally enjoy, please come in for a visit.  - Violence - If anyone is threatening or hurting you, please call immediately.   

## 2022-05-25 ENCOUNTER — Encounter: Payer: Managed Care, Other (non HMO) | Admitting: Family Medicine

## 2022-05-25 LAB — CBC WITH DIFFERENTIAL/PLATELET
Basophils Absolute: 0 10*3/uL (ref 0.0–0.2)
Basos: 0 %
EOS (ABSOLUTE): 0.1 10*3/uL (ref 0.0–0.4)
Eos: 1 %
Hematocrit: 42.3 % (ref 34.0–46.6)
Hemoglobin: 13.6 g/dL (ref 11.1–15.9)
Immature Grans (Abs): 0 10*3/uL (ref 0.0–0.1)
Immature Granulocytes: 0 %
Lymphocytes Absolute: 1.8 10*3/uL (ref 0.7–3.1)
Lymphs: 19 %
MCH: 29.6 pg (ref 26.6–33.0)
MCHC: 32.2 g/dL (ref 31.5–35.7)
MCV: 92 fL (ref 79–97)
Monocytes Absolute: 0.8 10*3/uL (ref 0.1–0.9)
Monocytes: 8 %
Neutrophils Absolute: 6.6 10*3/uL (ref 1.4–7.0)
Neutrophils: 72 %
Platelets: 446 10*3/uL (ref 150–450)
RBC: 4.6 x10E6/uL (ref 3.77–5.28)
RDW: 13.2 % (ref 11.7–15.4)
WBC: 9.2 10*3/uL (ref 3.4–10.8)

## 2022-05-25 LAB — LIPID PANEL
Chol/HDL Ratio: 3.5 ratio (ref 0.0–4.4)
Cholesterol, Total: 243 mg/dL — ABNORMAL HIGH (ref 100–199)
HDL: 69 mg/dL (ref >39)
LDL Chol Calc (NIH): 161 mg/dL — ABNORMAL HIGH (ref 0–99)
Triglycerides: 75 mg/dL (ref 0–149)
VLDL Cholesterol Cal: 13 mg/dL (ref 5–40)

## 2022-05-25 LAB — COMPREHENSIVE METABOLIC PANEL
ALT: 20 IU/L (ref 0–32)
AST: 18 IU/L (ref 0–40)
Albumin/Globulin Ratio: 2 (ref 1.2–2.2)
Albumin: 4.9 g/dL (ref 3.8–4.9)
Alkaline Phosphatase: 135 IU/L — ABNORMAL HIGH (ref 44–121)
BUN/Creatinine Ratio: 25 — ABNORMAL HIGH (ref 9–23)
BUN: 15 mg/dL (ref 6–24)
Bilirubin Total: 0.4 mg/dL (ref 0.0–1.2)
CO2: 23 mmol/L (ref 20–29)
Calcium: 9.7 mg/dL (ref 8.7–10.2)
Chloride: 101 mmol/L (ref 96–106)
Creatinine, Ser: 0.61 mg/dL (ref 0.57–1.00)
Globulin, Total: 2.5 g/dL (ref 1.5–4.5)
Glucose: 109 mg/dL — ABNORMAL HIGH (ref 70–99)
Potassium: 4.8 mmol/L (ref 3.5–5.2)
Sodium: 139 mmol/L (ref 134–144)
Total Protein: 7.4 g/dL (ref 6.0–8.5)
eGFR: 107 mL/min/{1.73_m2} (ref >59)

## 2022-05-25 LAB — HEMOGLOBIN A1C
Est. average glucose Bld gHb Est-mCnc: 117 mg/dL
Hgb A1c MFr Bld: 5.7 % — ABNORMAL HIGH (ref 4.8–5.6)

## 2022-05-25 LAB — TSH: TSH: 0.864 u[IU]/mL (ref 0.450–4.500)

## 2022-05-25 NOTE — Addendum Note (Signed)
Addended by: Myles Gip on: 05/25/2022 12:58 PM   Modules accepted: Orders

## 2022-07-17 ENCOUNTER — Ambulatory Visit: Payer: Managed Care, Other (non HMO) | Admitting: Family Medicine

## 2022-07-17 ENCOUNTER — Encounter: Payer: Self-pay | Admitting: Family Medicine

## 2022-07-17 VITALS — BP 138/87 | HR 79 | Ht 64.0 in | Wt 201.8 lb

## 2022-07-17 DIAGNOSIS — S46911A Strain of unspecified muscle, fascia and tendon at shoulder and upper arm level, right arm, initial encounter: Secondary | ICD-10-CM | POA: Diagnosis not present

## 2022-07-17 MED ORDER — MELOXICAM 15 MG PO TABS: 15.0000 mg | ORAL_TABLET | Freq: Every day | ORAL | 0 refills | Status: DC

## 2022-07-17 NOTE — Progress Notes (Signed)
I,Sha'taria Tyson,acting as a Education administrator for Lavon Paganini, MD.,have documented all relevant documentation on the behalf of Lavon Paganini, MD,as directed by  Lavon Paganini, MD while in the presence of Lavon Paganini, MD.   Established patient visit   Patient: Beth [name]   DOB: [birth year]   52 y.o. Female  MRN: [id number] Visit Date: 07/17/2022  Today's healthcare provider: Lavon Paganini, MD   Chief Complaint  Patient presents with   Shoulder Pain   Subjective    Shoulder Pain  The pain is present in the right shoulder. This is a new problem. The current episode started in the past 7 days (Fridays). There has been no history of extremity trauma. The problem occurs daily. The problem has been gradually improving. The quality of the pain is described as aching. The pain is at a severity of 3/10. Associated symptoms include a limited range of motion. The symptoms are aggravated by activity. She has tried NSAIDS (Gentle PT at home and shoulder brace) for the symptoms. The treatment provided mild relief.   Patient reports she heard a little pop early January when working out. Pain radiates down to elbow at times.  It is feeling better with home exercises and rest, ice, advil  Medications: Outpatient Medications Prior to Visit  Medication Sig   acetaminophen (TYLENOL) 650 MG CR tablet Take 1,300 mg by mouth every 8 (eight) hours as needed for pain.   Artificial Tear Solution (TEARS NATURALE OP) Place 1 drop into both eyes daily.   Calcium Carbonate-Vitamin D (CALCIUM 600+D PO) Take 2 tablets by mouth daily.   CRANBERRY PO Take 2 tablets by mouth daily.   FIBER PO Take 1 capsule by mouth daily. Gummy   fish oil-omega-3 fatty acids 1000 MG capsule Take 2 g by mouth daily.   Garlic Oil 4098 MG CAPS Take 1,000 mg by mouth daily.   Ibuprofen 200 MG CAPS Take 4 capsules (800 mg total) by mouth every 6 (six) hours as needed (pain, fever, headache). (Patient taking  differently: Take 400 mg by mouth every 6 (six) hours as needed (pain, fever, headache).)   Ibuprofen-diphenhydrAMINE HCl (ADVIL PM) 200-25 MG CAPS Take 1 tablet by mouth at bedtime as needed (sleep).   letrozole (FEMARA) 2.5 MG tablet TAKE 1 TABLET(2.5 MG) BY MOUTH DAILY   loratadine (CLARITIN) 10 MG tablet Take 10 mg by mouth daily as needed for allergies.   Multiple Vitamin (MULTIVITAMIN WITH MINERALS) TABS tablet Take 1 tablet by mouth daily. Women's One A Day   Saccharomyces boulardii (PROBIOTIC) 250 MG CAPS    vitamin B-12 (CYANOCOBALAMIN) 1000 MCG tablet Take 1,000 mcg by mouth daily.   White Petrolatum-Mineral Oil (GENTEAL TEARS NIGHT-TIME) OINT Apply 1 drop to eye at bedtime.   No facility-administered medications prior to visit.    Review of Systems per HPI     Objective    BP 138/87 (BP Location: Right Arm, Patient Position: Sitting, Cuff Size: Large)   Pulse 79   Ht '5\' 4"'$  (1.626 m)   Wt 201 lb 12.8 oz (91.5 kg)   BMI 34.64 kg/m    Physical Exam Vitals reviewed.  Constitutional:      General: She is not in acute distress.    Appearance: She is well-developed.  HENT:     Head: Normocephalic and atraumatic.  Eyes:     General: No scleral icterus.    Conjunctiva/sclera: Conjunctivae normal.  Cardiovascular:     Rate and Rhythm: Normal rate and  regular rhythm.  Pulmonary:     Effort: Pulmonary effort is normal. No respiratory distress.  Musculoskeletal:     Comments: R Shoulder: Inspection reveals no abnormalities, atrophy or asymmetry. Palpation is normal with no tenderness over AC joint or bicipital groove. ROM is full in all planes (has pain with active abduction but intact). Rotator cuff strength normal throughout. No signs of impingement with negative Neer and Hawkin's tests, empty can.  Skin:    General: Skin is warm and dry.     Findings: No rash.  Neurological:     Mental Status: She is alert and oriented to person, place, and time.  Psychiatric:         Behavior: Behavior normal.       No results found for any visits on 07/17/22.  Assessment & Plan     1. Strain of right shoulder, initial encounter - new problem - likely with rotator cuff strain, but no evidence of tear as ROM and strength intact - treat with RICE, HEP given - meloxicam daily x2 wks then prn - no other NSAIDs while on meloxicam - ok to work, listen to your body about lifting and overhead motion - if not improving, consider PT or SM referral   Meds ordered this encounter  Medications   meloxicam (MOBIC) 15 MG tablet    Sig: Take 1 tablet (15 mg total) by mouth daily.    Dispense:  30 tablet    Refill:  0     Return if symptoms worsen or fail to improve.      Total time spent on today's visit was greater than 30 minutes, including both face-to-face time and nonface-to-face time personally spent on review of chart, discussing further work-up, treatment options, answering patient's questions, and coordinating care.    I, Lavon Paganini, MD, have reviewed all documentation for this visit. The documentation on 07/17/22 for the exam, diagnosis, procedures, and orders are all accurate and complete.   Alaya Iverson, Dionne Bucy, MD, MPH [facility]

## 2022-07-21 ENCOUNTER — Encounter: Payer: Self-pay | Admitting: Family Medicine

## 2022-08-07 ENCOUNTER — Ambulatory Visit
Admission: RE | Admit: 2022-08-07 | Discharge: 2022-08-07 | Disposition: A | Discharge status: Home / Self Care | Payer: Managed Care, Other (non HMO) | Source: Ambulatory Visit | Attending: Medical Oncology | Admitting: Medical Oncology

## 2022-08-07 DIAGNOSIS — C50212 Malignant neoplasm of upper-inner quadrant of left female breast: Secondary | ICD-10-CM | POA: Diagnosis not present

## 2022-08-15 ENCOUNTER — Other Ambulatory Visit: Payer: Self-pay | Admitting: Family Medicine

## 2022-08-29 ENCOUNTER — Encounter: Payer: Self-pay | Admitting: Family Medicine

## 2022-09-11 ENCOUNTER — Ambulatory Visit: Payer: Managed Care, Other (non HMO) | Admitting: Family Medicine

## 2022-09-13 ENCOUNTER — Encounter: Payer: Self-pay | Admitting: Family Medicine

## 2022-09-13 ENCOUNTER — Ambulatory Visit (INDEPENDENT_AMBULATORY_CARE_PROVIDER_SITE_OTHER): Payer: Managed Care, Other (non HMO) | Admitting: Family Medicine

## 2022-09-13 DIAGNOSIS — Z23 Encounter for immunization: Secondary | ICD-10-CM

## 2022-09-13 NOTE — Progress Notes (Signed)
Patient presented for vaccination; provided by CMA. Patient not seen or assessed by NP.  [address]wyn[address] Sprout, [address] [place], [place] [address] [phone] (phone) [phone] (fax) [place]

## 2022-09-26 ENCOUNTER — Encounter: Payer: Self-pay | Admitting: Oncology

## 2022-09-26 ENCOUNTER — Inpatient Hospital Stay: Payer: Managed Care, Other (non HMO) | Attending: Oncology | Admitting: Oncology

## 2022-09-26 VITALS — BP 130/84 | HR 90 | Temp 99.0°F | Ht 64.0 in | Wt 197.0 lb

## 2022-09-26 DIAGNOSIS — Z79811 Long term (current) use of aromatase inhibitors: Secondary | ICD-10-CM | POA: Diagnosis not present

## 2022-09-26 DIAGNOSIS — Z8349 Family history of other endocrine, nutritional and metabolic diseases: Secondary | ICD-10-CM | POA: Insufficient documentation

## 2022-09-26 DIAGNOSIS — Z8249 Family history of ischemic heart disease and other diseases of the circulatory system: Secondary | ICD-10-CM | POA: Insufficient documentation

## 2022-09-26 DIAGNOSIS — Z8042 Family history of malignant neoplasm of prostate: Secondary | ICD-10-CM | POA: Insufficient documentation

## 2022-09-26 DIAGNOSIS — Z881 Allergy status to other antibiotic agents status: Secondary | ICD-10-CM | POA: Diagnosis not present

## 2022-09-26 DIAGNOSIS — Z87442 Personal history of urinary calculi: Secondary | ICD-10-CM | POA: Insufficient documentation

## 2022-09-26 DIAGNOSIS — Z833 Family history of diabetes mellitus: Secondary | ICD-10-CM | POA: Diagnosis not present

## 2022-09-26 DIAGNOSIS — Z803 Family history of malignant neoplasm of breast: Secondary | ICD-10-CM | POA: Diagnosis not present

## 2022-09-26 DIAGNOSIS — Z8616 Personal history of COVID-19: Secondary | ICD-10-CM | POA: Insufficient documentation

## 2022-09-26 DIAGNOSIS — C50212 Malignant neoplasm of upper-inner quadrant of left female breast: Secondary | ICD-10-CM

## 2022-09-26 DIAGNOSIS — Z809 Family history of malignant neoplasm, unspecified: Secondary | ICD-10-CM | POA: Insufficient documentation

## 2022-09-26 DIAGNOSIS — Z79899 Other long term (current) drug therapy: Secondary | ICD-10-CM | POA: Insufficient documentation

## 2022-09-26 DIAGNOSIS — Z170 Estrogen receptor positive status [ER+]: Secondary | ICD-10-CM | POA: Diagnosis not present

## 2022-09-26 NOTE — Progress Notes (Signed)
[facility]  [phone] [phone]  ID: Beth [name] OB: Mar 30, 1971  MR#: [id number]  AOZ#:[id number]  Beth Care Team: Beth [name], Beth as PCP - General (Family Medicine) Beth [name], Beth (General Surgery) Beth [name], Beth as Referring Physician (Obstetrics and Gynecology) Beth [name], Beth (Inactive) as Oncology Nurse Navigator Beth [name], Beth as Referring Physician (Radiation Oncology) Beth [name], Beth as Consulting Physician (Oncology) Beth [name], Beth as Consulting Physician (General Surgery)  CHIEF COMPLAINT: Pathologic stage Ia ER/PR positive, 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  Oncotype DX score 3.  INTERVAL HISTORY: Beth returns to clinic today for routine 19-month evaluation.  She continues to feel well and remains asymptomatic.  She continues to tolerate letrozole without significant side effects.  She has no neurologic complaints.  She denies any recent fevers or illnesses.  She has a good appetite and denies weight loss.  She has no chest pain, shortness of breath, cough, or hemoptysis.  She denies any nausea, vomiting, constipation, or diarrhea.  She has no urinary complaints.  Beth offers no specific complaints today.  REVIEW OF SYSTEMS:   Review of Systems  Constitutional: Negative.  Negative for fever, malaise/fatigue and weight loss.  Respiratory: Negative.  Negative for cough, hemoptysis and shortness of breath.   Cardiovascular: Negative.  Negative for chest pain and leg swelling.  Gastrointestinal: Negative.  Negative for abdominal pain.  Genitourinary: Negative.  Negative for dysuria.  Musculoskeletal: Negative.  Negative for back pain.  Skin: Negative.  Negative for rash.  Neurological: Negative.  Negative for dizziness, focal weakness and weakness.  Psychiatric/Behavioral: Negative.  [name] is not nervous/anxious.     As  per HPI. Otherwise, a complete review of systems is negative.  PAST MEDICAL HISTORY: Past Medical History:  Diagnosis Date   Anxiety    due to Covid    Cardiac disease    d/t family history of cardiac issues   Depression    due to Covid    Diffuse cystic mastopathy    Family history of breast cancer    Family history of malignant neoplasm of breast    Family history of prostate cancer    Graves disease    In remission   History of kidney stones    history of stone in past   Hyperlipidemia    Hyperthyroidism    remission   Personal history of radiation therapy    left breast 2022   PONV (postoperative nausea and vomiting)     PAST SURGICAL HISTORY: Past Surgical History:  Procedure Laterality Date   BREAST BIOPSY Left 07/2011   aspiration cyst   BREAST BIOPSY Left 01/2020   Sclerosing intraductal papilloma with apocrine metaplasia. No atypia or malignancy.   BREAST BIOPSY Left 07/15/2020   [place] bx, heart marker, path pending   BREAST DUCTAL SYSTEM EXCISION Left 08/09/2020   Procedure: EXCISION DUCTAL SYSTEM BREAST, left intraductal papilloma;  Surgeon: Beth [name], Beth;  Location: [facility];  Service: General;  Laterality: Left;   BREAST EXCISIONAL BIOPSY Right    2008 benign   BREAST LUMPECTOMY Left 09/13/2020   Procedure: BREAST LUMPECTOMY, re-excision;  Surgeon: Beth [name], Beth;  Location: [facility];  Service: General;  Laterality: Left;   BREAST LUMPECTOMY Left 10/08/2020   Procedure: BREAST LUMPECTOMY, re-excision;  Surgeon: Beth [name], Beth;  Location: [facility];  Service: General;  Laterality: Left;   BREAST LUMPECTOMY,RADIO FREQ LOCALIZER,AXILLARY SENTINEL LYMPH  NODE BIOPSY Left 08/09/2020   Procedure: BREAST LUMPECTOMY,RADIO FREQ LOCALIZER,AXILLARY SENTINEL LYMPH NODE BIOPSY;  Surgeon: Beth [name], Beth;  Location: [facility];  Service: General;  Laterality: Left;   COLONOSCOPY WITH PROPOFOL N/A 07/12/2020   Procedure: COLONOSCOPY WITH PROPOFOL;   Surgeon: Beth [name], Beth;  Location: [facility];  Service: Endoscopy;  Laterality: N/A;  PRIORITY 4   DILATION AND CURETTAGE OF UTERUS  2011   eyelid surgery      ORBITAL RECONSTRUCTION Left 2008   graves disease caused "bug eyed"   POLYPECTOMY  07/12/2020   Procedure: POLYPECTOMY;  Surgeon: Beth [name], Beth;  Location: [facility];  Service: Endoscopy;;   WISDOM TOOTH EXTRACTION      FAMILY HISTORY: Family History  Problem Relation Age of Onset   Diabetes Mother    Hypertension Mother    Hyperlipidemia Mother    Hypertension Father    Hyperlipidemia Father    Hypothyroidism Father    Prostate cancer Father 44   Hyperlipidemia Sister    Hypertension Sister    Hyperlipidemia Sister    Hypertension Sister    Cancer Sister    Diabetes Sister    Hyperlipidemia Sister    Hypertension Sister    Breast cancer Maternal Grandmother 70    ADVANCED DIRECTIVES (Y/N):  N  HEALTH MAINTENANCE: Social History   Tobacco Use   Smoking status: Never   Smokeless tobacco: Never  Vaping Use   Vaping Use: Never used  Substance Use Topics   Alcohol use: Yes    Comment: very rarely (maybe once a year)   Drug use: No     Colonoscopy:  PAP:  Bone density:  Lipid panel:  Allergies  Allergen Reactions   Polytrim [Polymyxin B-Trimethoprim] Rash    Was used on eyes after surgery which gave her a rash     Current Outpatient Medications  Medication Sig Dispense Refill   acetaminophen (TYLENOL) 650 MG CR tablet Take 1,300 mg by mouth every 8 (eight) hours as needed for pain.     Artificial Tear Solution (TEARS NATURALE OP) Place 1 drop into both eyes daily.     Calcium Carbonate-Vitamin D (CALCIUM 600+D PO) Take 2 tablets by mouth daily.     CRANBERRY PO Take 2 tablets by mouth daily.     FIBER PO Take 1 capsule by mouth daily. Gummy     fish oil-omega-3 fatty acids 1000 MG capsule Take 2 g by mouth daily.     Garlic Oil 1000 MG CAPS Take 1,000 mg by mouth daily.      Ibuprofen 200 MG CAPS Take 4 capsules (800 mg total) by mouth every 6 (six) hours as needed (pain, fever, headache). (Beth taking differently: Take 400 mg by mouth every 6 (six) hours as needed (pain, fever, headache).) 120 capsule 0   Ibuprofen-diphenhydrAMINE HCl (ADVIL PM) 200-25 MG CAPS Take 1 tablet by mouth at bedtime as needed (sleep).     letrozole (FEMARA) 2.5 MG tablet TAKE 1 TABLET(2.5 MG) BY MOUTH DAILY 90 tablet 1   loratadine (CLARITIN) 10 MG tablet Take 10 mg by mouth daily as needed for allergies.     Multiple Vitamin (MULTIVITAMIN WITH MINERALS) TABS tablet Take 1 tablet by mouth daily. Women's One A Day     Saccharomyces boulardii (PROBIOTIC) 250 MG CAPS      vitamin B-12 (CYANOCOBALAMIN) 1000 MCG tablet Take 1,000 mcg by mouth daily.     White Petrolatum-Mineral Oil (GENTEAL TEARS NIGHT-TIME) OINT Apply 1  drop to eye at bedtime.     meloxicam (MOBIC) 15 MG tablet TAKE 1 TABLET(15 MG) BY MOUTH DAILY 30 tablet 0   No current facility-administered medications for this visit.    OBJECTIVE: Vitals:   09/26/22 1013  BP: 130/84  Pulse: 90  Temp: 99 F (37.2 C)  SpO2: 100%      Body mass index is 33.81 kg/m.    ECOG FS:0 - Asymptomatic  General: Well-developed, well-nourished, no acute distress. Eyes: Pink conjunctiva, anicteric sclera. HEENT: Normocephalic, moist mucous membranes. Breast: Exam deferred today. Lungs: No audible wheezing or coughing. Heart: Regular rate and rhythm. Abdomen: Soft, nontender, no obvious distention. Musculoskeletal: No edema, cyanosis, or clubbing. Neuro: Alert, answering all questions appropriately. Cranial nerves grossly intact. Skin: No rashes or petechiae noted. Psych: Normal affect.  LAB RESULTS:  Lab Results  Component Value Date   NA 139 05/24/2022   K 4.8 05/24/2022   CL 101 05/24/2022   CO2 23 05/24/2022   GLUCOSE 109 (H) 05/24/2022   BUN 15 05/24/2022   CREATININE 0.61 05/24/2022   CALCIUM 9.7 05/24/2022   PROT  7.4 05/24/2022   ALBUMIN 4.9 05/24/2022   AST 18 05/24/2022   ALT 20 05/24/2022   ALKPHOS 135 (H) 05/24/2022   BILITOT 0.4 05/24/2022   GFRNONAA 110 05/18/2020   GFRAA 126 05/18/2020    Lab Results  Component Value Date   WBC 9.2 05/24/2022   NEUTROABS 6.6 05/24/2022   HGB 13.6 05/24/2022   HCT 42.3 05/24/2022   MCV 92 05/24/2022   PLT 446 05/24/2022     STUDIES: No results found.  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  Oncotype DX score 3.   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  Beth required 2 reexcision's in order to have positive margins the final 1 on October 08, 2020.  Her Oncotype score was 3 conferring low risk, therefore chemotherapy was not necessary despite her 3+ lymph nodes.  Beth completed adjuvant XRT in July 2022.  Continue letrozole for a total of 5 years completing treatment in July 2027, although given her node positive disease can consider extending treatment 7 to 10 years.  Her most recent mammogram in August 07, 2022 was reported as BI-RADS 2.  Repeat in February 2025.  Return to clinic in 6 months for routine evaluation. Genetic testing: Negative. Bone health: Bone mineral density on January 05, 2021 was reported as normal.  Repeat in July 2024.  I spent a total of 20 minutes reviewing chart data, face-to-face evaluation with [name], counseling and coordination of care as detailed above.    Beth expressed understanding and was in agreement with this plan. She also understands that She can call clinic at any time with any questions, concerns, or complaints.    Cancer Staging  Carcinoma of upper-inner quadrant of female  breast, left Staging form: Breast, AJCC 8th Edition - Pathologic stage from 08/25/2020: Stage IA (pT1c, pN1a, cM0, G1, ER+, PR+, HER2-) - Signed by Beth RuthsFinnegan, Isabelly Kobler J, Beth on 08/25/2020 Stage prefix: Initial diagnosis Histologic grading system: 3 grade system   Beth Ruthsimothy J Zyria Fiscus, Beth   09/26/2022 12:14 PM

## 2022-10-15 ENCOUNTER — Other Ambulatory Visit: Payer: Self-pay | Admitting: Oncology

## 2022-10-27 ENCOUNTER — Telehealth: Payer: Managed Care, Other (non HMO) | Admitting: Family Medicine

## 2022-10-27 ENCOUNTER — Telehealth: Payer: Managed Care, Other (non HMO) | Admitting: Physician Assistant

## 2022-10-27 DIAGNOSIS — U071 COVID-19: Secondary | ICD-10-CM

## 2022-10-27 DIAGNOSIS — R0981 Nasal congestion: Secondary | ICD-10-CM | POA: Diagnosis not present

## 2022-10-27 MED ORDER — FLUTICASONE PROPIONATE 50 MCG/ACT NA SUSP: 2.0000 | Freq: Every day | NASAL | 1 refills | Status: DC

## 2022-10-27 NOTE — Patient Instructions (Signed)
Quarantine and Scientist, product/process development and isolation are measures taken to protect the public from diseases that are a public health threat and that spread easily from person to person (are contagious). Isolation is when people who are sick are kept fully separate from people who are not sick. Anne Shutter is when people who have been exposed to a disease are kept away from people who are not sick. You may still need to quarantine or isolate even if you have gotten a vaccine for the disease. What diseases do I need to quarantine or isolate for? You may need to quarantine or isolate if you have been exposed to or diagnosed with: Severe acute respiratory syndromes, such as COVID-19. Cholera. Diphtheria. Tuberculosis. Plague. Smallpox. Yellow fever. Viral hemorrhagic fevers. These include [place], Ebola, and [place]. When should I quarantine? You should quarantine when: You have been in close contact with someone who has a contagious disease. Close contact means you have been less than 6 ft (1.8 m) away from the person for 15 minutes or more over a 24-hour period. When should I isolate? You should isolate when: You are sick with a contagious disease. You test positive for a contagious disease, even if you do not have symptoms. You are sick and think you may have a contagious disease. If you think you have a contagious disease: Get tested. If your test results are negative, you can end your isolation. If your test results are positive, isolate as told by your health care provider or local health officials. Follow these instructions at home: Medicines  Take over-the-counter and prescription medicines only as told by your provider. Finish your antibiotics even if you start to feel better. Stay up to date on your vaccines. Get vaccines and booster shots as recommended by your provider. Lifestyle When you are in quarantine or isolation: Wear a high-quality mask if you must be around others  at home and in public, as told by your provider. Do not go to places where you cannot wear a mask. Stay home and away from others as much as you can. Do not get close to people who may get very sick from the disease. Use a separate bathroom, if you can. Do not share personal items, like cups, towels, and utensils. Practice good hygiene. Improve the air flow (ventilation) in your home. This can help stop the disease from spreading to others. Activity Do not travel if you are in quarantine or isolation. Return to your normal activities as told by your provider. Ask your provider what activities are safe for you. General instructions Talk to your provider if you have a weak body's defense system (immune system). Your body may not respond as well to vaccines. You may need to wear a well-fitting mask, stay away from crowds, and avoid places that are not ventilated well. If you are sick, closely watch your symptoms. Follow any instructions from your provider. You may be told to rest, drink fluids, and take medicine. Follow any set guidelines for quarantine and isolation if you are in a place where a disease outbreak could occur. These places include correctional and detention centers, homeless shelters, and cruise ships. Where to find more information Centers for Disease Control and Prevention (CDC): TonerPromos.no Contact a health care provider if: You have a fever. Your symptoms come back or get worse after you isolate. Get help right away if: You have trouble breathing. You have chest pain. These symptoms may be an emergency. Get help right away. Call 911. Do not  wait to see if the symptoms will go away. Do not drive yourself to the hospital. This information is not intended to replace advice given to you by your health care provider. Make sure you discuss any questions you have with your health care provider. Document Revised: 02/17/2022 Document Reviewed: 02/17/2022 Elsevier Patient Education   2023 Elsevier Inc. COVID-19 COVID-19 is an infection caused by a virus called SARS-CoV-2. Most people who get COVID-19 have mild to moderate symptoms. Some have little to no symptoms. In others, the virus may cause a severe infection. What are the causes? COVID-19 is caused by a coronavirus. The virus may be in the air as droplets or as tiny specks of fluid (aerosols). It may also be on surfaces. You may catch the virus if you: Breathe in droplets when a person with COVID-19 breathes, speaks, sings, coughs, or sneezes. Touch something that has the virus on it and then touch your mouth, nose, or eyes. What increases the risk? Risk for infection: You are more likely to get COVID-19 if: You are within 6 ft (1.8 m) of a person who has COVID-19 for 15 minutes or longer. You provide care to a person who has COVID-19. You are in close contact with others. This includes hugging, kissing, or sharing utensils. Risk for serious illness caused by COVID-19: You are more likely to get very ill from COVID-19 if: You have cancer. You have a long-term (chronic) disease. This may be: A chronic lung disease, such as pulmonary embolism, chronic obstructive pulmonary disease (COPD), or cystic fibrosis. A disease that affects your body's defense system (immune system). If you have a weak immune system, you are said to be immunocompromised. A serious heart condition, such as heart failure, coronary artery disease, or cardiomyopathy. Diabetes. Chronic kidney disease. A liver disease, such as cirrhosis, nonalcoholic fatty liver disease, alcoholic liver disease, or autoimmune hepatitis. You are obese. You are pregnant or were just pregnant. You have sickle cell disease. What are the signs or symptoms? Symptoms of COVID-19 can range from mild to severe. They may appear any time from 2 to 14 days after you are exposed. They include: Fever or chills. Shortness of breath or trouble breathing. Feeling  tired. Headaches, body aches, or muscle aches. A runny or stuffy nose. Sneezing, coughing, or a sore throat. New loss of taste or smell. You may also have stomach problems, such as nausea, vomiting, or diarrhea. In some cases, you may not have any symptoms. How is this diagnosed? COVID-19 may be diagnosed by testing a sample to check for the virus. The most common tests are the PCR test and the antigen test. Tests may be done in the lab or at home. They include: Using a swab to take a sample of fluid from your nose. Testing a sample of saliva from your mouth. Testing a sample of mucus from your lungs (sputum). How is this treated? Treatment for COVID-19 depends on how severe your condition is. Mild symptoms can be treated at home. You should rest, drink fluids, and take over-the-counter medicine. If you have symptoms and risk factors, you may be prescribed a medicine that fights viruses (antiviral). Severe symptoms may be treated in a hospital intensive care unit (ICU). Treatment may include: Extra oxygen given through a tube in the nose, a face mask, or a hood. Medicines. These may include: Antivirals, such as remdesivir. Anti-inflammatories, such as corticosteroids. These help reduce inflammation. Antithrombotics. These help prevent or treat blood clots. Convalescent plasma. This helps boost  your immune system. Prone positioning. This is when you are laid on your stomach to help oxygen get into your lungs. Infection control measures. If you are at risk for a more serious illness, your health care provider may prescribe two medicines to help your immune system protect you. These are called long-acting monoclonal antibodies. They are given together every 6 months. How is this prevented? To protect yourself: Get the vaccine or vaccine series if you meet the guidelines. You can even get the vaccine while you are pregnant or making breast milk (lactating). Get an added dose of the vaccine  if you are immunocompromised. This applies if you have had an organ transplant or if you have a condition that affects your immune system. You should get the added dose 4 weeks after you got the first one. If you get an mRNA vaccine, you will need to get 3 doses. Talk to your provider about getting experimental monoclonal antibodies. This treatment can help prevent severe illness. It may be given to you if: You are immunocompromised. You cannot get the vaccine. You may not get the vaccine if you have a severe allergic reaction to it or to what it is made of. You are not fully vaccinated. You are in a place where there is COVID-19 and: You are in close contact with someone who has COVID-19. You are at high risk of being exposed. You are at risk of illness from new variants of the virus. To protect others: If you have symptoms of COVID-19, take steps to stop the virus from spreading. Stay home. Leave your house only to get medical care. Do not use public transit. Do not travel while you are sick. Wash your hands often with soap and water for at least 20 seconds. If soap and water are not available, use alcohol-based hand sanitizer. Make sure that all people in your household wash their hands well and often. Cough or sneeze into a tissue or your sleeve or elbow. Do not cough or sneeze into your hand or into the air. Where to find more information Centers for Disease Control and Prevention (CDC): TonerPromos.no World Health Organization [facility]): VisitDestination.com.br Get help right away if: You have trouble breathing. You have pain or pressure in your chest. You are confused. Your lips or fingernails turn blue. You have trouble waking from sleep. Your symptoms get worse. These symptoms may be an emergency. Get help right away. Call 911. Do not wait to see if the symptoms will go away. Do not drive yourself to the hospital. This information is not intended to replace advice given to you by your health care  provider. Make sure you discuss any questions you have with your health care provider. Document Revised: 02/17/2022 Document Reviewed: 02/17/2022 Elsevier Patient Education  2023 [employer].

## 2022-10-27 NOTE — Progress Notes (Signed)
   Thank you for the details you included in the comment boxes. Those details are very helpful in determining the best course of treatment for you and help us to provide the best care. Because you are Covid 19 positive and a candidate for antiviral treatment, we recommend that you convert this visit to a video visit in order for the provider to better assess what is going on.  The provider will be able to give you a more accurate diagnosis and treatment plan if we can more freely discuss your symptoms and with the addition of a virtual examination.   If you convert to a video visit, we will bill your insurance (similar to an office visit) and you will not be charged for this e-Visit. You will be able to stay at home and speak with the first available [facility] advanced practice provider. The link to do a video visit is in the drop down Menu tab of your Welcome screen in MyChart.  You will need to schedule the video visit. You can do this through one of two ways:  1) Go into your MyChart App and select the "Menu" button, then select the "Virtual Urgent Care Visit" then proceed scheduling -OR- 2) Go to [email or web address] and select "Get Started" under the Virtual Urgent Care option, select "View all options", then select the "Schedule on your Time" and proceed with scheduling.  Best Regards,  Jenni Edin Skarda, PA-C   I have spent 5 minutes in review of e-visit questionnaire, review and updating patient chart, medical decision making and response to patient.   Saraiyah Hemminger M Burney Calzadilla, PA-C  

## 2022-10-27 NOTE — Progress Notes (Signed)
Virtual Visit Consent   Beth [name], you are scheduled for a virtual visit with a [facility] provider today. Just as with appointments in the office, your consent must be obtained to participate. Your consent will be active for this visit and any virtual visit you may have with one of our providers in the next 365 days. If you have a MyChart account, a copy of this consent can be sent to you electronically.  As this is a virtual visit, video technology does not allow for your provider to perform a traditional examination. This may limit your provider's ability to fully assess your condition. If your provider identifies any concerns that need to be evaluated in person or the need to arrange testing (such as labs, EKG, etc.), we will make arrangements to do so. Although advances in technology are sophisticated, we cannot ensure that it will always work on either your end or our end. If the connection with a video visit is poor, the visit may have to be switched to a telephone visit. With either a video or telephone visit, we are not always able to ensure that we have a secure connection.  By engaging in this virtual visit, you consent to the provision of healthcare and authorize for your insurance to be billed (if applicable) for the services provided during this visit. Depending on your insurance coverage, you may receive a charge related to this service.  I need to obtain your verbal consent now. Are you willing to proceed with your visit today? Syera Tewell Pruitt has provided verbal consent on 10/27/2022 for a virtual visit (video or telephone). Georgana Curio, FNP  Date: 10/27/2022 2:52 PM  Virtual Visit via Video Note   I, Georgana Curio, connected with  Faron Carawan Issac  ([id number], [birth year]) on 10/27/22 at  2:45 PM EDT by a video-enabled telemedicine application and verified that I am speaking with the correct person using two identifiers.  Location: Patient: Virtual Visit Location Patient:  Home Provider: Virtual Visit Location Provider: Home Office   I discussed the limitations of evaluation and management by telemedicine and the availability of in person appointments. The patient expressed understanding and agreed to proceed.    History of Present Illness: Beth [name] is a 52 y.o. who identifies as a female who was assigned female at birth, and is being seen today for nasal congestion with cough and sneezing. No wheezing or sob. No fever. Sx since Tuesday. Mild per patient. Exposure to covid with sx. Declines paxlovid. In no distress. Requests flonase. Using delsym for cough. .  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, left (HCC) 07/23/2020   Polyp of sigmoid colon    Obesity 04/24/2018   Breast cyst, left 12/14/2017   Fatigue 01/29/2017   Right thyroid nodule 01/28/2016   Allergic rhinitis 10/23/2014   Graves disease 10/23/2014   Blood glucose elevated 10/23/2014   Family history of breast cancer 09/25/2013   Diffuse cystic mastopathy 09/25/2013   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   Hypercholesteremia 11/17/2008   Dupuytren's contracture of foot 11/17/2008    Allergies:  Allergies  Allergen Reactions   Polytrim [Polymyxin B-Trimethoprim] Rash  Was used on eyes after surgery which gave her a rash    Medications:  Current Outpatient Medications:    acetaminophen (TYLENOL) 650 MG CR tablet, Take 1,300 mg by mouth every 8 (eight) hours as needed for pain., Disp: , Rfl:    Artificial Tear Solution (TEARS NATURALE OP), Place 1 drop into both eyes daily., Disp: , Rfl:    Calcium Carbonate-Vitamin D (CALCIUM 600+D PO), Take 2 tablets by mouth daily., Disp: , Rfl:     CRANBERRY PO, Take 2 tablets by mouth daily., Disp: , Rfl:    FIBER PO, Take 1 capsule by mouth daily. Gummy, Disp: , Rfl:    fish oil-omega-3 fatty acids 1000 MG capsule, Take 2 g by mouth daily., Disp: , Rfl:    fluticasone (FLONASE) 50 MCG/ACT nasal spray, Place 2 sprays into both nostrils daily., Disp: 16 g, Rfl: 1   Garlic Oil 1000 MG CAPS, Take 1,000 mg by mouth daily., Disp: , Rfl:    Ibuprofen 200 MG CAPS, Take 4 capsules (800 mg total) by mouth every 6 (six) hours as needed (pain, fever, headache). (Patient taking differently: Take 400 mg by mouth every 6 (six) hours as needed (pain, fever, headache).), Disp: 120 capsule, Rfl: 0   Ibuprofen-diphenhydrAMINE HCl (ADVIL PM) 200-25 MG CAPS, Take 1 tablet by mouth at bedtime as needed (sleep)., Disp: , Rfl:    letrozole (FEMARA) 2.5 MG tablet, TAKE 1 TABLET(2.5 MG) BY MOUTH DAILY, Disp: 90 tablet, Rfl: 1   loratadine (CLARITIN) 10 MG tablet, Take 10 mg by mouth daily as needed for allergies., Disp: , Rfl:    meloxicam (MOBIC) 15 MG tablet, TAKE 1 TABLET(15 MG) BY MOUTH DAILY, Disp: 30 tablet, Rfl: 0   Multiple Vitamin (MULTIVITAMIN WITH MINERALS) TABS tablet, Take 1 tablet by mouth daily. Women's One A Day, Disp: , Rfl:    Saccharomyces boulardii (PROBIOTIC) 250 MG CAPS, , Disp: , Rfl:    vitamin B-12 (CYANOCOBALAMIN) 1000 MCG tablet, Take 1,000 mcg by mouth daily., Disp: , Rfl:    White Petrolatum-Mineral Oil (GENTEAL TEARS NIGHT-TIME) OINT, Apply 1 drop to eye at bedtime., Disp: , Rfl:   Observations/Objective: Patient is well-developed, well-nourished in no acute distress.  Resting comfortably  at home.  Head is normocephalic, atraumatic.  No labored breathing.  Speech is clear and coherent with logical content.  Patient is alert and oriented at baseline.    Assessment and Plan: 1. COVID-19  2. Nasal congestion  Increase fluids, humidifier at night, tylenol or ibuprofen as directed, UC if sx worsen. Other OTC meds discusssed.    Follow Up Instructions: I discussed the assessment and treatment plan with the patient. The patient was provided an opportunity to ask questions and all were answered. The patient agreed with the plan and demonstrated an understanding of the instructions.  A copy of instructions were sent to the patient via MyChart unless otherwise noted below.     The patient was advised to call back or seek an in-person evaluation if the symptoms worsen or if the condition fails to improve as anticipated.  Time:  I spent 10 minutes with the patient via telehealth technology discussing the above problems/concerns.    Georgana Curio, FNP

## 2022-11-11 ENCOUNTER — Ambulatory Visit
Admission: EM | Admit: 2022-11-11 | Discharge: 2022-11-11 | Disposition: A | Discharge status: Home / Self Care | Payer: Managed Care, Other (non HMO) | Attending: Urgent Care | Admitting: Urgent Care

## 2022-11-11 DIAGNOSIS — M79672 Pain in left foot: Secondary | ICD-10-CM | POA: Diagnosis not present

## 2022-11-11 MED ORDER — NAPROXEN 500 MG PO TABS: 500.0000 mg | ORAL_TABLET | Freq: Two times a day (BID) | ORAL | 0 refills | Status: AC

## 2022-11-11 NOTE — ED Provider Notes (Signed)
Renaldo Fiddler    CSN: [id number] Arrival date & time: 11/11/22  1121      History   Chief Complaint Chief Complaint  Patient presents with   Foot Pain    Plantar fasciitis - Entered by patient    HPI Beth [name] is a 52 y.o. female.    Foot Pain    Patient presents to urgent care with left heel pain x 2 to 3 weeks.  She reports a concern for possible plantar fasciitis.  She has been treating her symptoms with over-the-counter ibuprofen as well as shoe inserts (Dr. Margart Sickles orthotics).  Pain is worse with ambulation, especially in the morning.  Endorses 10 out of 10 pain.  No relevant past medical history.  Past Medical History:  Diagnosis Date   Anxiety    due to Covid    Cardiac disease    d/t family history of cardiac issues   Depression    due to Covid    Diffuse cystic mastopathy    Family history of breast cancer    Family history of malignant neoplasm of breast    Family history of prostate cancer    Graves disease    In remission   History of kidney stones    history of stone in past   Hyperlipidemia    Hyperthyroidism    remission   Personal history of radiation therapy    left breast 2022   PONV (postoperative nausea and vomiting)     Patient Active Problem List   Diagnosis Date Noted   Dupuytren's disease of palm of right hand 05/20/2021   Genetic testing 09/23/2020   Family history of prostate cancer    Malignant neoplasm of left female breast (HCC)    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, left (HCC) 07/23/2020   Polyp of sigmoid colon    Obesity 04/24/2018   Breast cyst, left 12/14/2017   Fatigue 01/29/2017   Right thyroid nodule 01/28/2016   Allergic rhinitis 10/23/2014   Graves disease 10/23/2014   Blood glucose elevated 10/23/2014   Family history of breast cancer 09/25/2013   Diffuse cystic mastopathy 09/25/2013   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    Hypercholesteremia 11/17/2008   Dupuytren's contracture of foot 11/17/2008    Past Surgical History:  Procedure Laterality Date   BREAST BIOPSY Left 07/2011   aspiration cyst   BREAST BIOPSY Left 01/2020   Sclerosing intraductal papilloma with apocrine metaplasia. No atypia or malignancy.   BREAST BIOPSY Left 07/15/2020   [place] bx, heart marker, path pending   BREAST DUCTAL SYSTEM EXCISION Left 08/09/2020   Procedure: EXCISION DUCTAL SYSTEM BREAST, left intraductal papilloma;  Surgeon: Duanne Guess, MD;  Location: [facility];  Service: General;  Laterality: Left;   BREAST EXCISIONAL BIOPSY Right    2008 benign   BREAST LUMPECTOMY Left 09/13/2020   Procedure: BREAST LUMPECTOMY, re-excision;  Surgeon: Duanne Guess, MD;  Location: [facility];  Service: General;  Laterality: Left;   BREAST LUMPECTOMY Left 10/08/2020   Procedure: BREAST LUMPECTOMY, re-excision;  Surgeon: Duanne Guess, MD;  Location: [facility];  Service: General;  Laterality: Left;   BREAST LUMPECTOMY,RADIO FREQ LOCALIZER,AXILLARY SENTINEL LYMPH NODE BIOPSY Left 08/09/2020   Procedure: BREAST LUMPECTOMY,RADIO FREQ LOCALIZER,AXILLARY SENTINEL LYMPH NODE BIOPSY;  Surgeon: Duanne Guess, MD;  Location: [facility];  Service: General;  Laterality: Left;   COLONOSCOPY WITH PROPOFOL N/A 07/12/2020   Procedure: COLONOSCOPY WITH PROPOFOL;  Surgeon: Midge Minium,  MD;  Location: [facility];  Service: Endoscopy;  Laterality: N/A;  PRIORITY 4   DILATION AND CURETTAGE OF UTERUS  2011   eyelid surgery      ORBITAL RECONSTRUCTION Left 2008   graves disease caused "bug eyed"   POLYPECTOMY  07/12/2020   Procedure: POLYPECTOMY;  Surgeon: Midge Minium, MD;  Location: [facility];  Service: Endoscopy;;   WISDOM TOOTH EXTRACTION      OB History     Gravida  1   Para  1   Term      Preterm      AB      Living  1      SAB      IAB      Ectopic      Multiple      Live Births           Obstetric  Comments    FIRST PREGNANCY 36 FIRST MENSTRUAL 11          Home Medications    Prior to Admission medications   Medication Sig Start Date End Date Taking? Authorizing Provider  acetaminophen (TYLENOL) 650 MG CR tablet Take 1,300 mg by mouth every 8 (eight) hours as needed for pain. 12/19/19   [provider]  Artificial Tear Solution (TEARS NATURALE OP) Place 1 drop into both eyes daily.    [provider]  Calcium Carbonate-Vitamin D (CALCIUM 600+D PO) Take 2 tablets by mouth daily.    [provider]  CRANBERRY PO Take 2 tablets by mouth daily.    [provider]  FIBER PO Take 1 capsule by mouth daily. Gummy    [provider]  fish oil-omega-3 fatty acids 1000 MG capsule Take 2 g by mouth daily.    [provider]  fluticasone (FLONASE) 50 MCG/ACT nasal spray Place 2 sprays into both nostrils daily. 10/27/22   Delorse Lek, FNP  Garlic Oil 1000 MG CAPS Take 1,000 mg by mouth daily. 06/19/16   [provider]  Ibuprofen 200 MG CAPS Take 4 capsules (800 mg total) by mouth every 6 (six) hours as needed (pain, fever, headache). Patient taking differently: Take 400 mg by mouth every 6 (six) hours as needed (pain, fever, headache). 08/09/20   Duanne Guess, MD  Ibuprofen-diphenhydrAMINE HCl (ADVIL PM) 200-25 MG CAPS Take 1 tablet by mouth at bedtime as needed (sleep).    [provider]  letrozole (FEMARA) 2.5 MG tablet TAKE 1 TABLET(2.5 MG) BY MOUTH DAILY 10/16/22   Jeralyn Ruths, MD  loratadine (CLARITIN) 10 MG tablet Take 10 mg by mouth daily as needed for allergies.    [provider]  meloxicam (MOBIC) 15 MG tablet TAKE 1 TABLET(15 MG) BY MOUTH DAILY 08/15/22   Erasmo Downer, MD  Multiple Vitamin (MULTIVITAMIN WITH MINERALS) TABS tablet Take 1 tablet by mouth daily. Women's One A Day    [provider]  Saccharomyces boulardii (PROBIOTIC) 250 MG CAPS  07/25/21   [provider]   vitamin B-12 (CYANOCOBALAMIN) 1000 MCG tablet Take 1,000 mcg by mouth daily.    [provider]  White Petrolatum-Mineral Oil (GENTEAL TEARS NIGHT-TIME) OINT Apply 1 drop to eye at bedtime.    [provider]    Family History Family History  Problem Relation Age of Onset   Diabetes Mother    Hypertension Mother    Hyperlipidemia Mother    Hypertension Father    Hyperlipidemia Father  Hypothyroidism Father    Prostate cancer Father 19   Hyperlipidemia Sister    Hypertension Sister    Hyperlipidemia Sister    Hypertension Sister    Cancer Sister    Diabetes Sister    Hyperlipidemia Sister    Hypertension Sister    Breast cancer Maternal Grandmother 60    Social History Social History   Tobacco Use   Smoking status: Never   Smokeless tobacco: Never  Vaping Use   Vaping Use: Never used  Substance Use Topics   Alcohol use: Yes    Comment: very rarely (maybe once a year)   Drug use: No     Allergies   Polytrim [polymyxin b-trimethoprim]   Review of Systems Review of Systems   Physical Exam Triage Vital Signs ED Triage Vitals [11/11/22 1154]  Enc Vitals Group     BP      Pulse      Resp      Temp      Temp src      SpO2      Weight      Height      Head Circumference      Peak Flow      Pain Score 4     Pain Loc      Pain Edu?      Excl. in GC?    No data found.  Updated Vital Signs LMP 11/04/2022   Visual Acuity Right Eye Distance:   Left Eye Distance:   Bilateral Distance:    Right Eye Near:   Left Eye Near:    Bilateral Near:     Physical Exam Vitals reviewed.  Constitutional:      Appearance: Normal appearance.  Musculoskeletal:        General: Normal range of motion.       Feet:  Skin:    General: Skin is warm and dry.  Neurological:     General: No focal deficit present.     Mental Status: She is alert and oriented to person, place, and time.  Psychiatric:        Mood and Affect: Mood normal.         Behavior: Behavior normal.      UC Treatments / Results  Labs (all labs ordered are listed, but only abnormal results are displayed) Labs Reviewed - No data to display  EKG   Radiology No results found.  Procedures Procedures (including critical care time)  Medications Ordered in UC Medications - No data to display  Initial Impression / Assessment and Plan / UC Course  I have reviewed the triage vital signs and the nursing notes.  Pertinent labs & imaging results that were available during my care of the patient were reviewed by me and considered in my medical decision making (see chart for details).   Patient has tenderness over the plantar surface of the left medial calcaneus.  Recommended conservative treatment with stretching of the plantar surface of her foot using frozen water bottles, continued use of OTC orthotics, NSAIDs.  They request prescription for NSAID which I will provide.  Lastly, if symptoms do not improve or if they recur, I recommended evaluation by a qualified podiatrist.  Counseled patient on potential for adverse effects with medications prescribed/recommended today, ER and return-to-clinic precautions discussed, patient verbalized understanding and agreement with care plan.  Final Clinical Impressions(s) / UC Diagnoses   Final diagnoses:  None   Discharge Instructions   None  ED Prescriptions   None    PDMP not reviewed this encounter.   Charma Igo, [place] 11/11/22 1223

## 2022-11-11 NOTE — Discharge Instructions (Signed)
Recommend gentle stretching and cold therapy of your foot using a frozen water bottle.  If your symptoms do not resolve, or if they recur, I suggest evaluation by a qualified podiatrist who has additional treatments available to him.

## 2022-11-11 NOTE — ED Triage Notes (Signed)
Patient presents to UC for left heel pain x 2-3 weeks. Reports concerned with plantar fasciitis. Treating with OTC ibuprofen, shoe inserts. States pain worse with ambulation and in the mornings. States pain gets to 10/10.

## 2022-11-30 ENCOUNTER — Encounter: Payer: Self-pay | Admitting: Family Medicine

## 2022-12-11 NOTE — Telephone Encounter (Signed)
Forwarding to [employer].  Not sure how this is dealt with.

## 2023-01-08 ENCOUNTER — Ambulatory Visit
Admission: RE | Admit: 2023-01-08 | Discharge: 2023-01-08 | Disposition: A | Discharge status: Home / Self Care | Payer: Managed Care, Other (non HMO) | Source: Ambulatory Visit | Attending: Medical Oncology | Admitting: Medical Oncology

## 2023-01-08 DIAGNOSIS — Z13820 Encounter for screening for osteoporosis: Secondary | ICD-10-CM | POA: Insufficient documentation

## 2023-01-08 DIAGNOSIS — Z853 Personal history of malignant neoplasm of breast: Secondary | ICD-10-CM | POA: Insufficient documentation

## 2023-01-08 DIAGNOSIS — Z79811 Long term (current) use of aromatase inhibitors: Secondary | ICD-10-CM | POA: Diagnosis present

## 2023-02-08 ENCOUNTER — Ambulatory Visit: Payer: Managed Care, Other (non HMO) | Admitting: Radiation Oncology

## 2023-02-21 ENCOUNTER — Encounter: Payer: Self-pay | Admitting: Radiation Oncology

## 2023-02-21 ENCOUNTER — Ambulatory Visit
Admission: RE | Admit: 2023-02-21 | Discharge: 2023-02-21 | Disposition: A | Discharge status: Home / Self Care | Payer: Managed Care, Other (non HMO) | Source: Ambulatory Visit | Attending: Radiation Oncology | Admitting: Radiation Oncology

## 2023-02-21 VITALS — BP 126/83 | HR 84 | Temp 98.6°F | Resp 16 | Ht 64.0 in | Wt 201.0 lb

## 2023-02-21 DIAGNOSIS — Z923 Personal history of irradiation: Secondary | ICD-10-CM | POA: Insufficient documentation

## 2023-02-21 DIAGNOSIS — C50212 Malignant neoplasm of upper-inner quadrant of left female breast: Secondary | ICD-10-CM | POA: Diagnosis present

## 2023-02-21 DIAGNOSIS — Z79811 Long term (current) use of aromatase inhibitors: Secondary | ICD-10-CM | POA: Insufficient documentation

## 2023-02-21 DIAGNOSIS — Z170 Estrogen receptor positive status [ER+]: Secondary | ICD-10-CM | POA: Insufficient documentation

## 2023-02-21 NOTE — Progress Notes (Signed)
Radiation Oncology Follow up Note  Name: Beth [name]   Date:   02/21/2023 MRN:  [id number] DOB: Apr 11, 1971    This 52 y.o. 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  Seen today in routine follow-up she is doing well.  She specifically denies breast tenderness cough or bone pain..  She had mammograms back in February which I have reviewed were BI-RADS 2 benign.  She is currently on letrozole tolerating that well without side effect.  COMPLICATIONS OF TREATMENT: none  FOLLOW UP COMPLIANCE: keeps appointments   PHYSICAL EXAM:  BP 126/83   Pulse 84   Temp 98.6 F (37 C)   Resp 16   Ht 5\' 4"  (1.626 m)   Wt 201 lb (91.2 kg)   BMI 34.50 kg/m  Lungs are clear to A&P cardiac examination essentially unremarkable with regular rate and rhythm. No dominant mass or nodularity is noted in either breast in 2 positions examined. Incision is well-healed. No axillary or supraclavicular adenopathy is appreciated. Cosmetic result is excellent.  Well-developed well-nourished patient in NAD. HEENT reveals PERLA, EOMI, discs not visualized.  Oral cavity is clear. No oral mucosal lesions are identified. Neck is clear without evidence of cervical or supraclavicular adenopathy. Lungs are clear to A&P. Cardiac examination is essentially unremarkable with regular rate and rhythm without murmur rub or thrill. Abdomen is benign with no organomegaly or masses noted. Motor sensory and DTR levels are equal and symmetric in the upper and lower extremities. Cranial nerves II through XII are grossly intact. Proprioception is  intact. No peripheral adenopathy or edema is identified. No motor or sensory levels are noted. Crude visual fields are within normal range.  RADIOLOGY RESULTS: Mammograms reviewed compatible with above-stated findings  PLAN: Present time patient is doing well with no evidence of disease.  Now at 2 years.  Of asked to see her back in 1 year for follow-up and then will discontinue follow-up care.  Patient knows to call with any concerns.  She continues on letrozole without side effect.  I would like to take this opportunity to thank you for allowing me to participate in the care of your patient.Carmina Miller, MD

## 2023-03-27 ENCOUNTER — Inpatient Hospital Stay: Payer: Managed Care, Other (non HMO) | Attending: Oncology | Admitting: Oncology

## 2023-03-27 ENCOUNTER — Encounter: Payer: Self-pay | Admitting: Oncology

## 2023-03-27 VITALS — BP 148/78 | HR 80 | Temp 99.9°F | Resp 16 | Ht 64.0 in | Wt 203.0 lb

## 2023-03-27 DIAGNOSIS — Z803 Family history of malignant neoplasm of breast: Secondary | ICD-10-CM | POA: Diagnosis not present

## 2023-03-27 DIAGNOSIS — Z79811 Long term (current) use of aromatase inhibitors: Secondary | ICD-10-CM | POA: Diagnosis not present

## 2023-03-27 DIAGNOSIS — Z8249 Family history of ischemic heart disease and other diseases of the circulatory system: Secondary | ICD-10-CM | POA: Insufficient documentation

## 2023-03-27 DIAGNOSIS — Z809 Family history of malignant neoplasm, unspecified: Secondary | ICD-10-CM | POA: Diagnosis not present

## 2023-03-27 DIAGNOSIS — Z79899 Other long term (current) drug therapy: Secondary | ICD-10-CM | POA: Insufficient documentation

## 2023-03-27 DIAGNOSIS — Z8616 Personal history of COVID-19: Secondary | ICD-10-CM | POA: Insufficient documentation

## 2023-03-27 DIAGNOSIS — Z8042 Family history of malignant neoplasm of prostate: Secondary | ICD-10-CM | POA: Diagnosis not present

## 2023-03-27 DIAGNOSIS — Z8349 Family history of other endocrine, nutritional and metabolic diseases: Secondary | ICD-10-CM | POA: Insufficient documentation

## 2023-03-27 DIAGNOSIS — Z170 Estrogen receptor positive status [ER+]: Secondary | ICD-10-CM | POA: Insufficient documentation

## 2023-03-27 DIAGNOSIS — C50212 Malignant neoplasm of upper-inner quadrant of left female breast: Secondary | ICD-10-CM | POA: Insufficient documentation

## 2023-03-27 DIAGNOSIS — Z1721 Progesterone receptor positive status: Secondary | ICD-10-CM | POA: Insufficient documentation

## 2023-03-27 DIAGNOSIS — Z83438 Family history of other disorder of lipoprotein metabolism and other lipidemia: Secondary | ICD-10-CM | POA: Diagnosis not present

## 2023-03-27 DIAGNOSIS — Z881 Allergy status to other antibiotic agents status: Secondary | ICD-10-CM | POA: Diagnosis not present

## 2023-03-27 DIAGNOSIS — Z923 Personal history of irradiation: Secondary | ICD-10-CM | POA: Insufficient documentation

## 2023-03-27 DIAGNOSIS — Z833 Family history of diabetes mellitus: Secondary | ICD-10-CM | POA: Diagnosis not present

## 2023-03-27 DIAGNOSIS — Z87442 Personal history of urinary calculi: Secondary | ICD-10-CM | POA: Diagnosis not present

## 2023-03-27 NOTE — Progress Notes (Signed)
[facility]  [phone] [phone]  ID: Beth [name] OB: July 31, 1970  MR#: [id number]  EXB#:[id number]  Patient Care Team: Erasmo Downer, MD as PCP - General (Family Medicine) Kieth Brightly, MD (General Surgery) Nadara Mustard, MD as Referring Physician (Obstetrics and Gynecology) Scarlett Presto, RN (Inactive) as Oncology Nurse Navigator Carmina Miller, MD as Referring Physician (Radiation Oncology) Jeralyn Ruths, MD as Consulting Physician (Oncology) Duanne Guess, MD as Consulting Physician (General Surgery)  CHIEF COMPLAINT: Pathologic stage Ia ER/PR positive, 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  Oncotype DX score 3.  INTERVAL HISTORY: Patient returns to clinic today for routine 93-month evaluation.  She continues to feel well and remains asymptomatic.  She continues to tolerate letrozole without significant side effects.  She has no neurologic complaints.  She denies any recent fevers or illnesses.  She has a good appetite and denies weight loss.  She has no chest pain, shortness of breath, cough, or hemoptysis.  She denies any nausea, vomiting, constipation, or diarrhea.  She has no urinary complaints.  Patient offers no specific complaints today.  REVIEW OF SYSTEMS:   Review of Systems  Constitutional: Negative.  Negative for fever, malaise/fatigue and weight loss.  Respiratory: Negative.  Negative for cough, hemoptysis and shortness of breath.   Cardiovascular: Negative.  Negative for chest pain and leg swelling.  Gastrointestinal: Negative.  Negative for abdominal pain.  Genitourinary: Negative.  Negative for dysuria.  Musculoskeletal: Negative.  Negative for back pain.  Skin: Negative.  Negative for rash.  Neurological: Negative.  Negative for dizziness, focal weakness and weakness.  Psychiatric/Behavioral: Negative.  The patient is not nervous/anxious.     As  per HPI. Otherwise, a complete review of systems is negative.  PAST MEDICAL HISTORY: Past Medical History:  Diagnosis Date   Anxiety    due to Covid    Cardiac disease    d/t family history of cardiac issues   Depression    due to Covid    Diffuse cystic mastopathy    Family history of breast cancer    Family history of malignant neoplasm of breast    Family history of prostate cancer    Graves disease    In remission   History of kidney stones    history of stone in past   Hyperlipidemia    Hyperthyroidism    remission   Personal history of radiation therapy    left breast 2022   PONV (postoperative nausea and vomiting)     PAST SURGICAL HISTORY: Past Surgical History:  Procedure Laterality Date   BREAST BIOPSY Left 07/2011   aspiration cyst   BREAST BIOPSY Left 01/2020   Sclerosing intraductal papilloma with apocrine metaplasia. No atypia or malignancy.   BREAST BIOPSY Left 07/15/2020   [place] bx, heart marker, path pending   BREAST DUCTAL SYSTEM EXCISION Left 08/09/2020   Procedure: EXCISION DUCTAL SYSTEM BREAST, left intraductal papilloma;  Surgeon: Duanne Guess, MD;  Location: [facility];  Service: General;  Laterality: Left;   BREAST EXCISIONAL BIOPSY Right    2008 benign   BREAST LUMPECTOMY Left 09/13/2020   Procedure: BREAST LUMPECTOMY, re-excision;  Surgeon: Duanne Guess, MD;  Location: [facility];  Service: General;  Laterality: Left;   BREAST LUMPECTOMY Left 10/08/2020   Procedure: BREAST LUMPECTOMY, re-excision;  Surgeon: Duanne Guess, MD;  Location: [facility];  Service: General;  Laterality: Left;   BREAST LUMPECTOMY,RADIO FREQ LOCALIZER,AXILLARY SENTINEL LYMPH  NODE BIOPSY Left 08/09/2020   Procedure: BREAST LUMPECTOMY,RADIO FREQ LOCALIZER,AXILLARY SENTINEL LYMPH NODE BIOPSY;  Surgeon: Duanne Guess, MD;  Location: [facility];  Service: General;  Laterality: Left;   COLONOSCOPY WITH PROPOFOL N/A 07/12/2020   Procedure: COLONOSCOPY WITH PROPOFOL;   Surgeon: Midge Minium, MD;  Location: [facility];  Service: Endoscopy;  Laterality: N/A;  PRIORITY 4   DILATION AND CURETTAGE OF UTERUS  2011   eyelid surgery      ORBITAL RECONSTRUCTION Left 2008   graves disease caused "bug eyed"   POLYPECTOMY  07/12/2020   Procedure: POLYPECTOMY;  Surgeon: Midge Minium, MD;  Location: [facility];  Service: Endoscopy;;   WISDOM TOOTH EXTRACTION      FAMILY HISTORY: Family History  Problem Relation Age of Onset   Diabetes Mother    Hypertension Mother    Hyperlipidemia Mother    Hypertension Father    Hyperlipidemia Father    Hypothyroidism Father    Prostate cancer Father 85   Hyperlipidemia Sister    Hypertension Sister    Hyperlipidemia Sister    Hypertension Sister    Cancer Sister    Diabetes Sister    Hyperlipidemia Sister    Hypertension Sister    Breast cancer Maternal Grandmother 14    ADVANCED DIRECTIVES (Y/N):  N  HEALTH MAINTENANCE: Social History   Tobacco Use   Smoking status: Never   Smokeless tobacco: Never  Vaping Use   Vaping status: Never Used  Substance Use Topics   Alcohol use: Yes    Comment: very rarely (maybe once a year)   Drug use: No     Colonoscopy:  PAP:  Bone density:  Lipid panel:  Allergies  Allergen Reactions   Polytrim [Polymyxin B-Trimethoprim] Rash    Was used on eyes after surgery which gave her a rash     Current Outpatient Medications  Medication Sig Dispense Refill   acetaminophen (TYLENOL) 650 MG CR tablet Take 1,300 mg by mouth every 8 (eight) hours as needed for pain.     Artificial Tear Solution (TEARS NATURALE OP) Place 1 drop into both eyes daily.     Calcium Carbonate-Vitamin D (CALCIUM 600+D PO) Take 2 tablets by mouth daily.     CRANBERRY PO Take 2 tablets by mouth daily.     FIBER PO Take 1 capsule by mouth daily. Gummy     fish oil-omega-3 fatty acids 1000 MG capsule Take 2 g by mouth daily.     Garlic Oil 1000 MG CAPS Take 1,000 mg by mouth daily.      ibuprofen (ADVIL) 200 MG tablet Take 200 mg by mouth every 4 (four) hours as needed.     Ibuprofen-diphenhydrAMINE HCl (ADVIL PM) 200-25 MG CAPS Take 1 tablet by mouth at bedtime as needed (sleep).     letrozole (FEMARA) 2.5 MG tablet TAKE 1 TABLET(2.5 MG) BY MOUTH DAILY 90 tablet 1   loratadine (CLARITIN) 10 MG tablet Take 10 mg by mouth daily as needed for allergies.     Multiple Vitamin (MULTIVITAMIN WITH MINERALS) TABS tablet Take 1 tablet by mouth daily. Women's One A Day     naproxen (NAPROSYN) 500 MG tablet Take 1 tablet (500 mg total) by mouth 2 (two) times daily. 30 tablet 0   Saccharomyces boulardii (PROBIOTIC) 250 MG CAPS      vitamin B-12 (CYANOCOBALAMIN) 1000 MCG tablet Take 1,000 mcg by mouth daily.     White Petrolatum-Mineral Oil (GENTEAL TEARS NIGHT-TIME) OINT Apply 1 drop  to eye at bedtime.     No current facility-administered medications for this visit.    OBJECTIVE: Vitals:   03/27/23 1024  BP: (!) 148/78  Pulse: 80  Resp: 16  Temp: 99.9 F (37.7 C)  SpO2: 98%      Body mass index is 34.84 kg/m.    ECOG FS:0 - Asymptomatic  General: Well-developed, well-nourished, no acute distress. Eyes: Pink conjunctiva, anicteric sclera. HEENT: Normocephalic, moist mucous membranes. Breasts: Exam deferred today. Lungs: No audible wheezing or coughing. Heart: Regular rate and rhythm. Abdomen: Soft, nontender, no obvious distention. Musculoskeletal: No edema, cyanosis, or clubbing. Neuro: Alert, answering all questions appropriately. Cranial nerves grossly intact. Skin: No rashes or petechiae noted. Psych: Normal affect.  LAB RESULTS:  Lab Results  Component Value Date   NA 139 05/24/2022   K 4.8 05/24/2022   CL 101 05/24/2022   CO2 23 05/24/2022   GLUCOSE 109 (H) 05/24/2022   BUN 15 05/24/2022   CREATININE 0.61 05/24/2022   CALCIUM 9.7 05/24/2022   PROT 7.4 05/24/2022   ALBUMIN 4.9 05/24/2022   AST 18 05/24/2022   ALT 20 05/24/2022   ALKPHOS 135 (H)  05/24/2022   BILITOT 0.4 05/24/2022   GFRNONAA 110 05/18/2020   GFRAA 126 05/18/2020    Lab Results  Component Value Date   WBC 9.2 05/24/2022   NEUTROABS 6.6 05/24/2022   HGB 13.6 05/24/2022   HCT 42.3 05/24/2022   MCV 92 05/24/2022   PLT 446 05/24/2022     STUDIES: No results found.  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  Oncotype DX score 3.   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  Patient required 2 reexcision's in order to have positive margins, the most recent on October 08, 2020.  Her Oncotype score was 3 conferring low risk, therefore chemotherapy was not necessary despite her 3+ lymph nodes.  Patient completed adjuvant XRT in July 2022.  Continue letrozole for a total of 5 years completing treatment in July 2027, although given her node positive disease can consider extending treatment 10 years.  Her most recent mammogram in August 07, 2022 was reported as BI-RADS 2.  Repeat mammogram in February 2025.  Return to clinic in 6 months for routine evaluation.   Genetic testing: Negative.   Bone health: Bone mineral density on January 05, 2021 was reported as normal.  Repeat in July 2024.  I spent a total of 20 minutes reviewing chart data, face-to-face evaluation with the patient, counseling and coordination of care as detailed above.   Patient expressed understanding and was in agreement with this plan. She also understands that She can call clinic at any time with any questions, concerns, or complaints.    Cancer Staging  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, left [facility]) Staging form: Breast, AJCC 8th Edition - Pathologic stage from 08/25/2020:  Stage IA (pT1c, pN1a, cM0, G1, ER+, PR+, HER2-) - Signed by Jeralyn Ruths, MD on 08/25/2020 Stage prefix: Initial diagnosis Histologic grading system: 3 grade system   Jeralyn Ruths, MD   03/27/2023 10:57 AM

## 2023-04-11 ENCOUNTER — Other Ambulatory Visit: Payer: Self-pay | Admitting: Oncology

## 2023-05-07 ENCOUNTER — Ambulatory Visit (INDEPENDENT_AMBULATORY_CARE_PROVIDER_SITE_OTHER): Payer: Managed Care, Other (non HMO) | Admitting: Advanced Practice Midwife

## 2023-05-07 ENCOUNTER — Encounter: Payer: Self-pay | Admitting: Advanced Practice Midwife

## 2023-05-07 VITALS — BP 138/80 | HR 78 | Ht 64.0 in | Wt 200.0 lb

## 2023-05-07 DIAGNOSIS — Z0000 Encounter for general adult medical examination without abnormal findings: Secondary | ICD-10-CM

## 2023-05-07 DIAGNOSIS — Z01419 Encounter for gynecological examination (general) (routine) without abnormal findings: Secondary | ICD-10-CM | POA: Diagnosis not present

## 2023-05-07 NOTE — Progress Notes (Signed)
Gynecology Annual Exam  PCP: Erasmo Downer, MD  Chief Complaint:  Chief Complaint  Patient presents with   Annual Exam    History of Present Illness: Patient is a 52 y.o. G1P1 presents for annual exam. The patient has no gyn complaints today. She reports last period was in June and no bleeding since then. Reviewed post menopausal process and definition. Prior to the period in June her periods were irregular- not monthly.   LMP: Patient's last menstrual period was 12/07/2022 (exact date).  Postcoital Bleeding: no Dysmenorrhea: no   The patient is sexually active. She currently uses none for contraception. She denies dyspareunia. She denies vasomotor symptoms. The patient  does occasionally  perform self breast exams.  There is notable family history of breast or ovarian cancer in her family. Patient has history of cancer in her left breast.  The patient wears seatbelts: yes.   The patient has regular exercise: she has limited exercise due to plantar fascititis. She tries to eat a healthy diet. She has some soda in addition to primarily water at work.     The patient denies current symptoms of depression, however she has some increased stress related to her job. This has been affecting her sleep. She is currently taking some classes so she can update her resume.  Review of Systems: Review of Systems  Constitutional:  Negative for chills and fever.  HENT:  Negative for congestion, ear discharge, ear pain, hearing loss, sinus pain and sore throat.   Eyes:  Negative for blurred vision and double vision.  Respiratory:  Negative for cough, shortness of breath and wheezing.   Cardiovascular:  Negative for chest pain, palpitations and leg swelling.  Gastrointestinal:  Negative for abdominal pain, blood in stool, constipation, diarrhea, heartburn, melena, nausea and vomiting.  Genitourinary:  Negative for dysuria, flank pain, frequency, hematuria and urgency.  Musculoskeletal:   Negative for back pain, joint pain and myalgias.  Skin:  Negative for itching and rash.  Neurological:  Negative for dizziness, tingling, tremors, sensory change, speech change, focal weakness, seizures, loss of consciousness, weakness and headaches.  Endo/Heme/Allergies:  Negative for environmental allergies. Does not bruise/bleed easily.  Psychiatric/Behavioral:  Negative for depression, hallucinations, memory loss, substance abuse and suicidal ideas. The patient is not nervous/anxious and does not have insomnia.     Past Medical History:  Patient Active Problem List   Diagnosis Date Noted Date Diagnosed   Dupuytren's disease of palm of right hand 05/20/2021    Genetic testing 09/23/2020     Negative genetic testing. No pathogenic variants identified on the [facility] CancerNext-Expanded+RNA panel. The report date is 09/23/2020.  The CancerNext-Expanded + RNAinsight gene panel offered by [employer] and includes sequencing and rearrangement analysis for the following 77 genes: IP, ALK, APC*, ATM*, AXIN2, BAP1, BARD1, BLM, BMPR1A, BRCA1*, BRCA2*, BRIP1*, CDC73, CDH1*,CDK4, CDKN1B, CDKN2A, CHEK2*, CTNNA1, DICER1, FANCC, FH, FLCN, GALNT12, KIF1B, LZTR1, MAX, MEN1, MET, MLH1*, MSH2*, MSH3, MSH6*, MUTYH*, NBN, NF1*, NF2, NTHL1, PALB2*, PHOX2B, PMS2*, POT1, PRKAR1A, PTCH1, PTEN*, RAD51C*, RAD51D*,RB1, RECQL, RET, SDHA, SDHAF2, SDHB, SDHC, SDHD, SMAD4, SMARCA4, SMARCB1, SMARCE1, STK11, SUFU, TMEM127, TP53*,TSC1, TSC2, VHL and XRCC2 (sequencing and deletion/duplication); EGFR, EGLN1, HOXB13, KIT, MITF, PDGFRA, POLD1 and POLE (sequencing only); EPCAM and GREM1 (deletion/duplication only).     Family history of prostate cancer     Malignant neoplasm of left female breast (HCC)      2022. S/p whole breast and peripheral lymphatic radiation to left breast for stage IIa invasive mammary  carcinoma with mixed ductal and lobular features ER/PR positive.     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 No atypia or malignancy.   BREAST BIOPSY Left 07/15/2020   [place] bx, heart marker, path pending   BREAST DUCTAL SYSTEM EXCISION Left 08/09/2020   Procedure: EXCISION DUCTAL SYSTEM BREAST, left intraductal papilloma;  Surgeon: Duanne Guess, MD;  Location: [facility];  Service: General;  Laterality: Left;   BREAST EXCISIONAL BIOPSY Right    2008 benign   BREAST LUMPECTOMY Left 09/13/2020   Procedure: BREAST LUMPECTOMY, re-excision;  Surgeon: Duanne Guess, MD;  Location: [facility];  Service: General;  Laterality: Left;   BREAST LUMPECTOMY Left 10/08/2020   Procedure: BREAST LUMPECTOMY, re-excision;  Surgeon: Duanne Guess, MD;  Location: [facility];  Service: General;  Laterality: Left;   BREAST LUMPECTOMY,RADIO FREQ LOCALIZER,AXILLARY SENTINEL LYMPH NODE BIOPSY Left 08/09/2020   Procedure: BREAST LUMPECTOMY,RADIO FREQ LOCALIZER,AXILLARY SENTINEL LYMPH NODE BIOPSY;  Surgeon: Duanne Guess, MD;  Location: [facility];  Service: General;  Laterality: Left;   COLONOSCOPY WITH PROPOFOL N/A 07/12/2020   Procedure: COLONOSCOPY WITH PROPOFOL;  Surgeon: Midge Minium, MD;  Location: [facility];  Service: Endoscopy;  Laterality: N/A;  PRIORITY 4   DILATION AND CURETTAGE OF  UTERUS  2011   eyelid surgery      ORBITAL RECONSTRUCTION Left 2008   graves disease caused "bug eyed"   POLYPECTOMY  07/12/2020   Procedure: POLYPECTOMY;  Surgeon: Midge Minium, MD;  Location: [facility];  Service: Endoscopy;;   WISDOM TOOTH EXTRACTION      Gynecologic History:  Patient's last menstrual period was 12/07/2022 (exact date).  Last Pap: 2022 Results were:  no abnormalities  Last mammogram: 08/07/22 Results were: BI-RAD II Benign  Obstetric History: G1P1  Family History:  Family History  Problem Relation Age of Onset   Diabetes Mother    Hypertension Mother    Hyperlipidemia Mother    Hypertension Father    Hyperlipidemia Father    Hypothyroidism Father    Prostate cancer Father 60   Hyperlipidemia Sister    Hypertension Sister    Hyperlipidemia Sister    Hypertension Sister    Cancer Sister    Diabetes Sister    Hyperlipidemia Sister    Hypertension Sister    Breast cancer Maternal Grandmother 51    Social History:  Social History   Socioeconomic History   Marital status: Married    Spouse name: Acupuncturist   Number of children: Not on file   Years of education: Not on file   Highest education level: Not on file  Occupational History   Occupation: word processing    Comment: full time  Tobacco Use  Smoking status: Never   Smokeless tobacco: Never  Vaping Use   Vaping status: Never Used  Substance and Sexual Activity   Alcohol use: Yes    Comment: very rarely (maybe once a year)   Drug use: No   Sexual activity: Yes    Birth control/protection: None  Other Topics Concern   Not on file  Social History Narrative   Patient lives with husband.    She feels safe in her home.   Social Determinants of Health   Financial Resource Strain: Not on file  Food Insecurity: Not on file  Transportation Needs: Not on file  Physical Activity: Not on file  Stress: Not on file  Social Connections: Not on file  Intimate Partner Violence: Not on  file    Allergies:  Allergies  Allergen Reactions   Polytrim [Polymyxin B-Trimethoprim] Rash    Was used on eyes after surgery which gave her a rash     Medications: Prior to Admission medications   Medication Sig Start Date End Date Taking? Authorizing Provider  acetaminophen (TYLENOL) 650 MG CR tablet Take 1,300 mg by mouth every 8 (eight) hours as needed for pain. 12/19/19  Yes [provider]  Artificial Tear Solution (TEARS NATURALE OP) Place 1 drop into both eyes daily.   Yes [provider]  Calcium Carbonate-Vitamin D (CALCIUM 600+D PO) Take 2 tablets by mouth daily.   Yes [provider]  CRANBERRY PO Take 2 tablets by mouth daily.   Yes [provider]  FIBER PO Take 1 capsule by mouth daily. Gummy   Yes [provider]  fish oil-omega-3 fatty acids 1000 MG capsule Take 2 g by mouth daily.   Yes [provider]  Garlic Oil 1000 MG CAPS Take 1,000 mg by mouth daily. 06/19/16  Yes [provider]  ibuprofen (ADVIL) 200 MG tablet Take 200 mg by mouth every 4 (four) hours as needed.   Yes [provider]  Ibuprofen-diphenhydrAMINE HCl (ADVIL PM) 200-25 MG CAPS Take 1 tablet by mouth at bedtime as needed (sleep).   Yes [provider]  letrozole (FEMARA) 2.5 MG tablet TAKE 1 TABLET(2.5 MG) BY MOUTH DAILY 04/11/23  Yes Jeralyn Ruths, MD  loratadine (CLARITIN) 10 MG tablet Take 10 mg by mouth daily as needed for allergies.   Yes [provider]  Multiple Vitamin (MULTIVITAMIN WITH MINERALS) TABS tablet Take 1 tablet by mouth daily. Women's One A Day   Yes [provider]  naproxen (NAPROSYN) 500 MG tablet Take 1 tablet (500 mg total) by mouth 2 (two) times daily. 11/11/22  Yes Immordino, Jeannett Senior, FNP  Saccharomyces boulardii (PROBIOTIC) 250 MG CAPS  07/25/21  Yes [provider]  vitamin B-12 (CYANOCOBALAMIN) 1000 MCG tablet Take 1,000 mcg by mouth daily.   Yes [provider]  White Petrolatum-Mineral Oil (GENTEAL TEARS NIGHT-TIME) OINT Apply 1 drop to eye at bedtime.   Yes [provider]    Physical Exam Vitals: Blood pressure 138/80, pulse 78, height 5\' 4"  (1.626 m), weight 200 lb (90.7 kg), last menstrual period 12/07/2022.  General: NAD HEENT: normocephalic, anicteric Thyroid: no enlargement, no palpable nodules Pulmonary: No increased work of breathing, CTAB Cardiovascular: RRR, distal pulses 2+ Breast: Breast symmetrical, no tenderness, no palpable nodules or masses, no skin or nipple retraction present, inverted nipple on left breast, no nipple discharge.  No axillary or supraclavicular lymphadenopathy. Abdomen: NABS, soft, non-tender, non-distended.  Umbilicus without lesions.  No hepatomegaly, splenomegaly or  masses palpable. No evidence of hernia  Genitourinary: deferred for no concerns/PAP interval Extremities: no edema, erythema, or tenderness Neurologic: Grossly intact Psychiatric: mood appropriate, affect full   Assessment: 52 y.o. G1P1 routine annual exam  Plan: Problem List Items Addressed This Visit   None Visit Diagnoses     Well woman exam without gynecological exam    -  Primary       1) Mammogram - recommend yearly screening mammogram.  Mammogram has been ordered by oncologist. Bone density scan is up to date and wnl.  2) STI screening  was offered and declined  3) ASCCP guidelines and rationale discussed.  Patient opts for every 5 years screening interval  4) Contraception - the patient is currently using  none.    5) Colonoscopy is up to date -- Screening recommended starting at age 53 for average risk individuals, age 14 for individuals deemed at increased risk (including African Americans) and recommended to continue until age 60.  For patient age 2-85 individualized approach is recommended.  Gold standard screening is via colonoscopy, Cologuard screening is an acceptable alternative for patient  unwilling or unable to undergo colonoscopy.  "Colorectal cancer screening for average?risk adults: 2018 guideline update from the American Cancer Society"[place]: A Cancer Journal for Clinicians: Nov 15, 2016   6) Routine healthcare maintenance including cholesterol, diabetes screening discussed managed by PCP  7) Return in about 1 year (around 05/06/2024) for annual established gyn.   Tresea Mall, CNM [facility] 05/07/2023 12:38 PM

## 2023-05-31 ENCOUNTER — Telehealth: Payer: Self-pay

## 2023-05-31 ENCOUNTER — Other Ambulatory Visit: Payer: Self-pay | Admitting: Family Medicine

## 2023-05-31 ENCOUNTER — Ambulatory Visit (INDEPENDENT_AMBULATORY_CARE_PROVIDER_SITE_OTHER): Payer: Managed Care, Other (non HMO) | Admitting: Family Medicine

## 2023-05-31 ENCOUNTER — Encounter: Payer: Self-pay | Admitting: Family Medicine

## 2023-05-31 VITALS — BP 139/73 | HR 98 | Temp 98.1°F | Resp 14 | Ht 64.0 in | Wt 201.2 lb

## 2023-05-31 DIAGNOSIS — E66811 Obesity, class 1: Secondary | ICD-10-CM

## 2023-05-31 DIAGNOSIS — E7800 Pure hypercholesterolemia, unspecified: Secondary | ICD-10-CM | POA: Diagnosis not present

## 2023-05-31 DIAGNOSIS — F4321 Adjustment disorder with depressed mood: Secondary | ICD-10-CM

## 2023-05-31 DIAGNOSIS — Z0000 Encounter for general adult medical examination without abnormal findings: Secondary | ICD-10-CM

## 2023-05-31 DIAGNOSIS — Z6834 Body mass index (BMI) 34.0-34.9, adult: Secondary | ICD-10-CM

## 2023-05-31 DIAGNOSIS — E0500 Thyrotoxicosis with diffuse goiter without thyrotoxic crisis or storm: Secondary | ICD-10-CM

## 2023-05-31 DIAGNOSIS — R7303 Prediabetes: Secondary | ICD-10-CM

## 2023-05-31 NOTE — Telephone Encounter (Signed)
Telephone encounter also created for documentation purposes

## 2023-05-31 NOTE — Progress Notes (Signed)
Complete physical exam  Patient: Beth [name]   DOB: Nov 05, 1970   52 y.o. Female  MRN: [id number]  Subjective:    Chief Complaint  Patient presents with   Annual Exam    Beth [name] is a 52 y.o. female who presents today for a complete physical exam. She reports consuming a general diet.  She generally feels well. She reports sleeping well. She does not have additional problems to discuss today.    Discussed the use of AI scribe software for clinical note transcription with the patient, who gave verbal consent to proceed.  History of Present Illness   The patient, with a history of breast cancer and thyroid nodules, presents for a routine physical examination. She expresses concerns about her mental health, citing depression due to significant life changes, including job dissatisfaction and her spouse's impending retirement. The patient also reports struggles with acid reflux, which seems to be exacerbated by certain foods and lying down after meals. She has previously been treated with Prilosec, which provided some relief.  The patient is currently on letrozole for breast cancer treatment and has been tolerating it well with no reported side effects. She is two years into the treatment and is regularly monitored by her oncologist. The patient also mentions being in the perimenopausal stage, with her last period occurring approximately six months ago.  The patient acknowledges being prediabetic and expresses concerns about her cholesterol levels, anticipating the need for medication in the future. She also reports a flare-up of plantar fasciitis.       Most recent fall risk assessment:    07/17/2022    2:06 PM  Fall Risk   Falls in the past year? 0  Number falls in past yr: 0  Injury with Fall? 0  Risk for fall due to : No Fall Risks  Follow up Falls evaluation completed     Most recent depression screenings:    05/31/2023    8:50 AM 07/17/2022    2:06 PM  PHQ 2/9 Scores   PHQ - 2 Score 4 1  PHQ- 9 Score 10 3        Patient Care Team: Erasmo Downer, MD as PCP - General (Family Medicine) Kieth Brightly, MD (General Surgery) Nadara Mustard, MD as Referring Physician (Obstetrics and Gynecology) Scarlett Presto, RN (Inactive) as Oncology Nurse Navigator Carmina Miller, MD as Referring Physician (Radiation Oncology) Jeralyn Ruths, MD as Consulting Physician (Oncology) Duanne Guess, MD as Consulting Physician (General Surgery)   Outpatient Medications Prior to Visit  Medication Sig Note   acetaminophen (TYLENOL) 650 MG CR tablet Take 1,300 mg by mouth every 8 (eight) hours as needed for pain.    Artificial Tear Solution (TEARS NATURALE OP) Place 1 drop into both eyes daily.    Calcium Carbonate-Vitamin D (CALCIUM 600+D PO) Take 2 tablets by mouth daily.    CRANBERRY PO Take 2 tablets by mouth daily.    FIBER PO Take 1 capsule by mouth daily. Gummy    fish oil-omega-3 fatty acids 1000 MG capsule Take 2 g by mouth daily.    Garlic Oil 1000 MG CAPS Take 1,000 mg by mouth daily.    ibuprofen (ADVIL) 200 MG tablet Take 200 mg by mouth every 4 (four) hours as needed.    Ibuprofen-diphenhydrAMINE HCl (ADVIL PM) 200-25 MG CAPS Take 1 tablet by mouth at bedtime as needed (sleep).    letrozole (FEMARA) 2.5 MG tablet TAKE 1 TABLET(2.5 MG) BY  MOUTH DAILY    loratadine (CLARITIN) 10 MG tablet Take 10 mg by mouth daily as needed for allergies.    Multiple Vitamin (MULTIVITAMIN WITH MINERALS) TABS tablet Take 1 tablet by mouth daily. Women's One A Day    naproxen (NAPROSYN) 500 MG tablet Take 1 tablet (500 mg total) by mouth 2 (two) times daily. 05/31/2023: As needed   Saccharomyces boulardii (PROBIOTIC) 250 MG CAPS     vitamin B-12 (CYANOCOBALAMIN) 1000 MCG tablet Take 1,000 mcg by mouth daily.    White Petrolatum-Mineral Oil (GENTEAL TEARS NIGHT-TIME) OINT Apply 1 drop to eye at bedtime.    No facility-administered medications prior to  visit.    ROS        Objective:     BP 139/73 (BP Location: Right Arm, Patient Position: Sitting, Cuff Size: Large)   Pulse 98   Temp 98.1 F (36.7 C)   Resp 14   Ht 5\' 4"  (1.626 m)   Wt 201 lb 3.2 oz (91.3 kg)   LMP 12/07/2022 (Exact Date)   SpO2 100%   BMI 34.54 kg/m    Physical Exam Vitals reviewed.  Constitutional:      General: She is not in acute distress.    Appearance: Normal appearance. She is well-developed. She is not diaphoretic.  HENT:     Head: Normocephalic and atraumatic.     Right Ear: Tympanic membrane, ear canal and external ear normal.     Left Ear: Tympanic membrane, ear canal and external ear normal.     Nose: Nose normal.     Mouth/Throat:     Mouth: Mucous membranes are moist.     Pharynx: Oropharynx is clear. No oropharyngeal exudate.  Eyes:     General: No scleral icterus.    Conjunctiva/sclera: Conjunctivae normal.     Pupils: Pupils are equal, round, and reactive to light.  Neck:     Thyroid: No thyromegaly.  Cardiovascular:     Rate and Rhythm: Normal rate and regular rhythm.     Heart sounds: Normal heart sounds. No murmur heard. Pulmonary:     Effort: Pulmonary effort is normal. No respiratory distress.     Breath sounds: Normal breath sounds. No wheezing or rales.  Abdominal:     General: There is no distension.     Palpations: Abdomen is soft.     Tenderness: There is no abdominal tenderness.  Musculoskeletal:        General: No deformity.     Cervical back: Neck supple.     Right lower leg: No edema.     Left lower leg: No edema.  Lymphadenopathy:     Cervical: No cervical adenopathy.  Skin:    General: Skin is warm and dry.     Findings: No rash.  Neurological:     Mental Status: She is alert and oriented to person, place, and time. Mental status is at baseline.     Gait: Gait normal.  Psychiatric:        Mood and Affect: Mood normal.        Behavior: Behavior normal.        Thought Content: Thought content  normal.      No results found for any visits on 05/31/23.     Assessment & Plan:    Routine Health Maintenance and Physical Exam  Immunization History  Administered Date(s) Administered   Influenza-Unspecified 04/05/2017, 03/19/2020, 04/03/2021, 04/13/2022, 04/03/2023   MODERNA COVID-19 SARS-COV-2 PEDS BIVALENT BOOSTER 2yr-19yr 04/11/2021  Moderna Covid-19 Vaccine Bivalent Booster 19yrs & up 04/30/2022   Moderna Sars-Covid-2 Vaccination 08/23/2019, 09/27/2019, 05/20/2020   PNEUMOCOCCAL CONJUGATE-20 05/20/2021   Td 04/24/2018   Tdap 01/31/2008   Zoster Recombinant(Shingrix) 05/24/2022, 09/13/2022    Health Maintenance  Topic Date Due   COVID-19 Vaccine (6 - 2024-25 season) 02/18/2023   MAMMOGRAM  08/07/2024   Cervical Cancer Screening (HPV/Pap Cotest)  04/07/2026   Colonoscopy  07/13/2027   DTaP/Tdap/Td (3 - Td or Tdap) 04/24/2028   Pneumococcal Vaccine 25-26 Years old  Completed   INFLUENZA VACCINE  Completed   Hepatitis C Screening  Completed   HIV Screening  Completed   Zoster Vaccines- Shingrix  Completed   HPV VACCINES  Aged Out    Discussed health benefits of physical activity, and encouraged her to engage in regular exercise appropriate for her age and condition.  Problem List Items Addressed This Visit       Endocrine   Diffuse thyrotoxic goiter   Relevant Orders   TSH     Other   Hypercholesteremia   Relevant Orders   Comprehensive metabolic panel   Lipid panel   Blood glucose elevated   Obesity   Relevant Orders   CBC w/Diff/Platelet   Other Visit Diagnoses       Encounter for annual physical exam    -  Primary   Relevant Orders   TSH   CBC w/Diff/Platelet   Comprehensive metabolic panel   Lipid panel   Hemoglobin A1c          Annual Physical Examination Routine annual physical examination. Discussed lab results, dietary habits, and situational depression due to job dissatisfaction and husband's retirement. Patient is perimenopausal  and on letrozole for two years without issues. Emphasized regular follow-ups with oncology and gynecology. - Order routine labs including thyroid function, blood counts, kidney and liver function, cholesterol, and A1c - Measure waist circumference - Schedule next year's physical examination  Breast Cancer (Post-Treatment) Patient is on letrozole for breast cancer, two years into treatment without issues. Regular oncology follow-ups ongoing. Bone density scan normal. Discussed potential extension of letrozole treatment beyond five years based on future assessments. - Continue letrozole - Follow up with oncology as scheduled  Perimenopause Patient is perimenopausal with irregular periods, last period six months ago. Discussed unpredictability of menstrual cycles during this phase and possibility of periods returning after long intervals. - Monitor menstrual cycles  Depression Patient reports depression due to job dissatisfaction and husband's retirement. Discussed situational nature of symptoms and importance of monitoring mental health. - Monitor mental health and provide support as needed  Prediabetes Patient aware of prediabetic status. Discussed importance of monitoring and lifestyle modifications to prevent progression to diabetes. Family history of diabetes noted. Explained prediabetes as a warning sign requiring attention. - Order A1c test - Discuss lifestyle modifications to manage prediabetes  Hyperlipidemia Patient concerned about cholesterol levels and potential need for medication. Family history of hyperlipidemia noted. Discussed influence of genetics and age on cholesterol levels and potential need for medication based on lab results and cardiovascular risk. - Order cholesterol test - Discuss potential need for medication based on lab results  Gastroesophageal Reflux Disease (GERD) Patient reports increased acid reflux. Previous Prilosec course effective. Discussed dietary  modifications and meal timing to manage symptoms. Advised to avoid spicy foods, citrus, tomatoes, and to eat at least two hours before bedtime. - Consider another two-week course of Prilosec if symptoms persist - Advise on dietary modifications and meal timing  General  Health Maintenance Patient up to date on most vaccinations and screenings. Scheduled for COVID booster. Flu shot, pneumonia, and shingles vaccines current. Tetanus shot due in 2029. Colonoscopy due in 2029. Pap smear due in 2027. - Ensure all vaccinations and screenings are up to date       Return in about 1 year (around 05/30/2024) for CPE.     Shirlee Latch, MD

## 2023-06-01 LAB — COMPREHENSIVE METABOLIC PANEL
ALT: 29 [IU]/L (ref 0–32)
AST: 23 [IU]/L (ref 0–40)
Albumin: 4.5 g/dL (ref 3.8–4.9)
Alkaline Phosphatase: 123 [IU]/L — ABNORMAL HIGH (ref 44–121)
BUN/Creatinine Ratio: 28 — ABNORMAL HIGH (ref 9–23)
BUN: 17 mg/dL (ref 6–24)
Bilirubin Total: 0.3 mg/dL (ref 0.0–1.2)
CO2: 23 mmol/L (ref 20–29)
Calcium: 10.4 mg/dL — ABNORMAL HIGH (ref 8.7–10.2)
Chloride: 105 mmol/L (ref 96–106)
Creatinine, Ser: 0.6 mg/dL (ref 0.57–1.00)
Globulin, Total: 2.8 g/dL (ref 1.5–4.5)
Glucose: 105 mg/dL — ABNORMAL HIGH (ref 70–99)
Potassium: 5.1 mmol/L (ref 3.5–5.2)
Sodium: 144 mmol/L (ref 134–144)
Total Protein: 7.3 g/dL (ref 6.0–8.5)
eGFR: 107 mL/min/{1.73_m2} (ref >59)

## 2023-06-01 LAB — CBC WITH DIFFERENTIAL/PLATELET
Basophils Absolute: 0 10*3/uL (ref 0.0–0.2)
Basos: 0 %
EOS (ABSOLUTE): 0.1 10*3/uL (ref 0.0–0.4)
Eos: 1 %
Hematocrit: 40.3 % (ref 34.0–46.6)
Hemoglobin: 12.8 g/dL (ref 11.1–15.9)
Immature Grans (Abs): 0 10*3/uL (ref 0.0–0.1)
Immature Granulocytes: 0 %
Lymphocytes Absolute: 2.2 10*3/uL (ref 0.7–3.1)
Lymphs: 23 %
MCH: 29.3 pg (ref 26.6–33.0)
MCHC: 31.8 g/dL (ref 31.5–35.7)
MCV: 92 fL (ref 79–97)
Monocytes Absolute: 0.7 10*3/uL (ref 0.1–0.9)
Monocytes: 7 %
Neutrophils Absolute: 6.6 10*3/uL (ref 1.4–7.0)
Neutrophils: 69 %
Platelets: 395 10*3/uL (ref 150–450)
RBC: 4.37 x10E6/uL (ref 3.77–5.28)
RDW: 13.4 % (ref 11.7–15.4)
WBC: 9.6 10*3/uL (ref 3.4–10.8)

## 2023-06-01 LAB — LIPID PANEL
Chol/HDL Ratio: 3.6 {ratio} (ref 0.0–4.4)
Cholesterol, Total: 241 mg/dL — ABNORMAL HIGH (ref 100–199)
HDL: 67 mg/dL (ref >39)
LDL Chol Calc (NIH): 160 mg/dL — ABNORMAL HIGH (ref 0–99)
Triglycerides: 80 mg/dL (ref 0–149)
VLDL Cholesterol Cal: 14 mg/dL (ref 5–40)

## 2023-06-01 LAB — HEMOGLOBIN A1C
Est. average glucose Bld gHb Est-mCnc: 128 mg/dL
Hgb A1c MFr Bld: 6.1 % — ABNORMAL HIGH (ref 4.8–5.6)

## 2023-06-01 LAB — TSH: TSH: 0.798 u[IU]/mL (ref 0.450–4.500)

## 2023-06-04 NOTE — Telephone Encounter (Signed)
Pt advised via mychart forms available for pickup. And message was read. Please see below  Last read by Festus Barren Fasnacht "Anne" at  3:41 PM on 06/01/2023.

## 2023-08-13 ENCOUNTER — Ambulatory Visit
Admission: RE | Admit: 2023-08-13 | Discharge: 2023-08-13 | Disposition: A | Discharge status: Home / Self Care | Payer: Managed Care, Other (non HMO) | Source: Ambulatory Visit | Attending: Oncology | Admitting: Oncology

## 2023-08-13 ENCOUNTER — Other Ambulatory Visit: Payer: Self-pay | Admitting: Oncology

## 2023-08-13 DIAGNOSIS — C50212 Malignant neoplasm of upper-inner quadrant of left female breast: Secondary | ICD-10-CM | POA: Insufficient documentation

## 2023-08-24 ENCOUNTER — Encounter: Payer: Self-pay | Admitting: Family Medicine

## 2023-09-25 ENCOUNTER — Inpatient Hospital Stay: Payer: Managed Care, Other (non HMO) | Attending: Oncology | Admitting: Oncology

## 2023-09-25 ENCOUNTER — Encounter: Payer: Self-pay | Admitting: Oncology

## 2023-09-25 VITALS — BP 151/96 | HR 113 | Temp 99.5°F | Resp 16 | Ht 64.0 in | Wt 210.0 lb

## 2023-09-25 DIAGNOSIS — Z170 Estrogen receptor positive status [ER+]: Secondary | ICD-10-CM | POA: Diagnosis not present

## 2023-09-25 DIAGNOSIS — Z803 Family history of malignant neoplasm of breast: Secondary | ICD-10-CM | POA: Insufficient documentation

## 2023-09-25 DIAGNOSIS — Z809 Family history of malignant neoplasm, unspecified: Secondary | ICD-10-CM | POA: Diagnosis not present

## 2023-09-25 DIAGNOSIS — Z1721 Progesterone receptor positive status: Secondary | ICD-10-CM | POA: Insufficient documentation

## 2023-09-25 DIAGNOSIS — Z8349 Family history of other endocrine, nutritional and metabolic diseases: Secondary | ICD-10-CM | POA: Diagnosis not present

## 2023-09-25 DIAGNOSIS — Z833 Family history of diabetes mellitus: Secondary | ICD-10-CM | POA: Insufficient documentation

## 2023-09-25 DIAGNOSIS — Z1732 Human epidermal growth factor receptor 2 negative status: Secondary | ICD-10-CM | POA: Diagnosis not present

## 2023-09-25 DIAGNOSIS — Z8042 Family history of malignant neoplasm of prostate: Secondary | ICD-10-CM | POA: Insufficient documentation

## 2023-09-25 DIAGNOSIS — C50212 Malignant neoplasm of upper-inner quadrant of left female breast: Secondary | ICD-10-CM | POA: Insufficient documentation

## 2023-09-25 DIAGNOSIS — Z87442 Personal history of urinary calculi: Secondary | ICD-10-CM | POA: Diagnosis not present

## 2023-09-25 DIAGNOSIS — Z8249 Family history of ischemic heart disease and other diseases of the circulatory system: Secondary | ICD-10-CM | POA: Insufficient documentation

## 2023-09-25 DIAGNOSIS — Z8616 Personal history of COVID-19: Secondary | ICD-10-CM | POA: Insufficient documentation

## 2023-09-25 DIAGNOSIS — Z923 Personal history of irradiation: Secondary | ICD-10-CM | POA: Diagnosis not present

## 2023-09-25 DIAGNOSIS — Z79811 Long term (current) use of aromatase inhibitors: Secondary | ICD-10-CM | POA: Diagnosis not present

## 2023-09-25 DIAGNOSIS — Z881 Allergy status to other antibiotic agents status: Secondary | ICD-10-CM | POA: Diagnosis not present

## 2023-09-25 DIAGNOSIS — Z83438 Family history of other disorder of lipoprotein metabolism and other lipidemia: Secondary | ICD-10-CM | POA: Diagnosis not present

## 2023-09-25 NOTE — Progress Notes (Signed)
 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  Oncotype DX score 3.  INTERVAL HISTORY: Patient returns to clinic today for routine 32-month evaluation.  She currently feels well and is asymptomatic.  She continues to tolerate letrozole without significant side effects.  She has no neurologic complaints.  She denies any recent fevers or illnesses.  She has a good appetite and denies weight loss.  She has no chest pain, shortness of breath, cough, or hemoptysis.  She denies any nausea, vomiting, constipation, or diarrhea.  She has no urinary complaints.  Patient offers no specific complaints today.  REVIEW OF SYSTEMS:   Review of Systems  Constitutional: Negative.  Negative for fever, malaise/fatigue and weight loss.  Respiratory: Negative.  Negative for cough, hemoptysis and shortness of breath.   Cardiovascular: Negative.  Negative for chest pain and leg swelling.  Gastrointestinal: Negative.  Negative for abdominal pain.  Genitourinary: Negative.  Negative for dysuria.  Musculoskeletal: Negative.  Negative for back pain.  Skin: Negative.  Negative for rash.  Neurological: Negative.  Negative for dizziness, focal weakness and weakness.  Psychiatric/Behavioral: Negative.  The patient is not nervous/anxious.     As per HPI. Otherwise, a complete review of systems is negative.  PAST MEDICAL HISTORY: Past Medical History:  Diagnosis Date   Anxiety    due to Covid    Cardiac disease    d/t family history of cardiac issues   Depression    due to Covid     Diffuse cystic mastopathy    Family history of breast cancer    Family history of malignant neoplasm of breast    Family history of prostate cancer    Graves disease    In remission   History of kidney stones    history of stone in past   Hyperlipidemia    Hyperthyroidism    remission   Personal history of radiation therapy    left breast 2022   PONV (postoperative nausea and vomiting)     PAST SURGICAL HISTORY: Past Surgical History:  Procedure Laterality Date   BREAST BIOPSY Left 07/2011   aspiration cyst   BREAST BIOPSY Left 01/2020   Sclerosing intraductal papilloma with apocrine metaplasia. No atypia or malignancy.   BREAST BIOPSY Left 07/15/2020   [place] bx, heart marker, path pending   BREAST DUCTAL SYSTEM EXCISION Left 08/09/2020   Procedure: EXCISION DUCTAL SYSTEM BREAST, left intraductal papilloma;  Surgeon: Duanne Guess, MD;  Location: [facility];  Service: General;  Laterality: Left;   BREAST EXCISIONAL BIOPSY Right    2008 benign   BREAST LUMPECTOMY Left 09/13/2020   Procedure: BREAST LUMPECTOMY, re-excision;  Surgeon: Duanne Guess, MD;  Location: [facility];  Service: General;  Laterality: Left;   BREAST LUMPECTOMY Left 10/08/2020   Procedure: BREAST LUMPECTOMY, re-excision;  Surgeon: Duanne Guess, MD;  Location: [facility];  Service: General;  Laterality: Left;   BREAST LUMPECTOMY,RADIO FREQ LOCALIZER,AXILLARY SENTINEL LYMPH NODE BIOPSY Left 08/09/2020   Procedure: BREAST LUMPECTOMY,RADIO FREQ LOCALIZER,AXILLARY SENTINEL LYMPH NODE BIOPSY;  Surgeon: Duanne Guess, MD;  Location: [facility];  Service: General;  Laterality: Left;   COLONOSCOPY WITH  PROPOFOL N/A 07/12/2020   Procedure: COLONOSCOPY WITH PROPOFOL;  Surgeon: Midge Minium, MD;  Location: [facility];  Service: Endoscopy;  Laterality: N/A;  PRIORITY 4   DILATION AND CURETTAGE OF UTERUS  2011   eyelid surgery      ORBITAL RECONSTRUCTION Left 2008   graves disease caused "bug eyed"    POLYPECTOMY  07/12/2020   Procedure: POLYPECTOMY;  Surgeon: Midge Minium, MD;  Location: [facility];  Service: Endoscopy;;   WISDOM TOOTH EXTRACTION      FAMILY HISTORY: Family History  Problem Relation Age of Onset   Diabetes Mother    Hypertension Mother    Hyperlipidemia Mother    Hypertension Father    Hyperlipidemia Father    Hypothyroidism Father    Prostate cancer Father 52   Hyperlipidemia Sister    Hypertension Sister    Hyperlipidemia Sister    Hypertension Sister    Cancer Sister    Diabetes Sister    Hyperlipidemia Sister    Hypertension Sister    Breast cancer Maternal Grandmother 6    ADVANCED DIRECTIVES (Y/N):  N  HEALTH MAINTENANCE: Social History   Tobacco Use   Smoking status: Never   Smokeless tobacco: Never  Vaping Use   Vaping status: Never Used  Substance Use Topics   Alcohol use: Yes    Comment: very rarely (maybe once a year)   Drug use: No     Colonoscopy:  PAP:  Bone density:  Lipid panel:  Allergies  Allergen Reactions   Polytrim [Polymyxin B-Trimethoprim] Rash    Was used on eyes after surgery which gave her a rash     Current Outpatient Medications  Medication Sig Dispense Refill   acetaminophen (TYLENOL) 650 MG CR tablet Take 1,300 mg by mouth every 8 (eight) hours as needed for pain.     Artificial Tear Solution (TEARS NATURALE OP) Place 1 drop into both eyes daily.     Calcium Carbonate-Vitamin D (CALCIUM 600+D PO) Take 2 tablets by mouth daily.     CRANBERRY PO Take 2 tablets by mouth daily.     FIBER PO Take 1 capsule by mouth daily. Gummy     fish oil-omega-3 fatty acids 1000 MG capsule Take 2 g by mouth daily.     Garlic Oil 1000 MG CAPS Take 1,000 mg by mouth daily.     ibuprofen (ADVIL) 200 MG tablet Take 200 mg by mouth every 4 (four) hours as needed.     Ibuprofen-diphenhydrAMINE HCl (ADVIL PM) 200-25 MG CAPS Take 1 tablet by mouth at bedtime as needed (sleep).     letrozole (FEMARA) 2.5 MG tablet TAKE 1  TABLET(2.5 MG) BY MOUTH DAILY 90 tablet 1   loratadine (CLARITIN) 10 MG tablet Take 10 mg by mouth daily as needed for allergies.     Multiple Vitamin (MULTIVITAMIN WITH MINERALS) TABS tablet Take 1 tablet by mouth daily. Women's One A Day     naproxen (NAPROSYN) 500 MG tablet Take 1 tablet (500 mg total) by mouth 2 (two) times daily. 30 tablet 0   Saccharomyces boulardii (PROBIOTIC) 250 MG CAPS      vitamin B-12 (CYANOCOBALAMIN) 1000 MCG tablet Take 1,000 mcg by mouth daily.     White Petrolatum-Mineral Oil (GENTEAL TEARS NIGHT-TIME) OINT Apply 1 drop to eye at bedtime.     No current facility-administered medications for this visit.    OBJECTIVE: Vitals:   09/25/23 1034  BP: (!) 151/96  Pulse: (!) 113  Resp:  16  Temp: 99.5 F (37.5 C)  SpO2: 99%      Body mass index is 36.05 kg/m.    ECOG FS:0 - Asymptomatic  General: Well-developed, well-nourished, no acute distress. Eyes: Pink conjunctiva, anicteric sclera. HEENT: Normocephalic, moist mucous membranes. Lungs: No audible wheezing or coughing. Heart: Regular rate and rhythm. Abdomen: Soft, nontender, no obvious distention. Musculoskeletal: No edema, cyanosis, or clubbing. Neuro: Alert, answering all questions appropriately. Cranial nerves grossly intact. Skin: No rashes or petechiae noted. Psych: Normal affect.  LAB RESULTS:  Lab Results  Component Value Date   NA 144 05/31/2023   K 5.1 05/31/2023   CL 105 05/31/2023   CO2 23 05/31/2023   GLUCOSE 105 (H) 05/31/2023   BUN 17 05/31/2023   CREATININE 0.60 05/31/2023   CALCIUM 10.4 (H) 05/31/2023   PROT 7.3 05/31/2023   ALBUMIN 4.5 05/31/2023   AST 23 05/31/2023   ALT 29 05/31/2023   ALKPHOS 123 (H) 05/31/2023   BILITOT 0.3 05/31/2023   GFRNONAA 110 05/18/2020   GFRAA 126 05/18/2020    Lab Results  Component Value Date   WBC 9.6 05/31/2023   NEUTROABS 6.6 05/31/2023   HGB 12.8 05/31/2023   HCT 40.3 05/31/2023   MCV 92 05/31/2023   PLT 395 05/31/2023      STUDIES: No results found.  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  Oncotype DX score 3.   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  Patient required 2 reexcision's in order to have negative margins, the most recent on October 08, 2020.  Her Oncotype score was 3 conferring low risk, therefore chemotherapy was not necessary despite her 3+ lymph nodes.  Patient completed adjuvant XRT in July 2022.  Continue letrozole for a total of 5 years completing treatment in July 2027, although given her node positive disease can consider extending treatment 10 years.  Her most recent bilateral diagnostic mammogram on August 13, 2023 was reported BI-RADS 2.  Repeat in February 2026.  Return to clinic in 6 months for routine evaluation.   Genetic testing: Negative.   Bone health: Bone mineral density on January 08, 2023 was once again reported as normal.  I spent a total of 20 minutes reviewing chart data, face-to-face evaluation with the patient, counseling and coordination of care as detailed above.   Patient expressed understanding and was in agreement with this plan. She also understands that She can call clinic at any time with any questions, concerns, or complaints.    Cancer Staging  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, left [facility]) Staging form: Breast, AJCC 8th Edition - Pathologic stage from 08/25/2020: Stage IA (pT1c, pN1a, cM0, G1, ER+, PR+, HER2-) - Signed by Jeralyn Ruths, MD on 08/25/2020 Stage prefix: Initial diagnosis Histologic grading system: 3 grade system   Jeralyn Ruths, MD   09/25/2023 11:29 AM

## 2023-10-10 ENCOUNTER — Other Ambulatory Visit: Payer: Self-pay | Admitting: Oncology

## 2024-02-27 ENCOUNTER — Encounter: Payer: Self-pay | Admitting: Radiation Oncology

## 2024-02-27 ENCOUNTER — Ambulatory Visit
Admission: RE | Admit: 2024-02-27 | Discharge: 2024-02-27 | Disposition: A | Discharge status: Home / Self Care | Payer: Managed Care, Other (non HMO) | Source: Ambulatory Visit | Attending: Radiation Oncology | Admitting: Radiation Oncology

## 2024-02-27 VITALS — BP 142/96 | HR 75 | Temp 98.0°F | Resp 15 | Ht 64.0 in | Wt 212.4 lb

## 2024-02-27 DIAGNOSIS — C50212 Malignant neoplasm of upper-inner quadrant of left female breast: Secondary | ICD-10-CM | POA: Diagnosis present

## 2024-02-27 DIAGNOSIS — Z923 Personal history of irradiation: Secondary | ICD-10-CM | POA: Insufficient documentation

## 2024-02-27 DIAGNOSIS — Z170 Estrogen receptor positive status [ER+]: Secondary | ICD-10-CM | POA: Insufficient documentation

## 2024-02-27 DIAGNOSIS — Z79811 Long term (current) use of aromatase inhibitors: Secondary | ICD-10-CM | POA: Insufficient documentation

## 2024-02-27 NOTE — Progress Notes (Signed)
 Radiation Oncology Follow up Note  Name: Beth [name]   Date:   02/27/2024 MRN:  [id number] DOB: 25-Oct-1970    This 53 y.o. female presents to the clinic today for 3-year follow-up status post wide local excision of her left breast for stage IIa (T1 cN1 M0) invasive mammary carcinoma with ductal and lobular features ER/PR positive.  REFERRING PROVIDER: Myrla Jon HERO, MD  HPI: Patient is a 53 year old female now out over 3 years having completed whole breast radiation to her left breast for stage IIa ER positive invasive mammary carcinoma.  Seen today in routine follow-up she is doing well.  She specifically denies breast tenderness cough or bone pain..  She is currently on letrozole  tolerating that well without side effect.  She had mammograms back in February which I reviewed were BI-RADS 2 benign.  COMPLICATIONS OF TREATMENT: none  FOLLOW UP COMPLIANCE: keeps appointments   PHYSICAL EXAM:  BP (!) 142/96   Pulse 75   Temp 98 F (36.7 C)   Resp 15   Ht 5' 4 (1.626 m)   Wt 212 lb 6.4 oz (96.3 kg)   BMI 36.46 kg/m  Lungs are clear to A&P cardiac examination essentially unremarkable with regular rate and rhythm. No dominant mass or nodularity is noted in either breast in 2 positions examined. Incision is well-healed. No axillary or supraclavicular adenopathy is appreciated. Cosmetic result is excellent.  Well-developed well-nourished patient in NAD. HEENT reveals PERLA, EOMI, discs not visualized.  Oral cavity is clear. No oral mucosal lesions are identified. Neck is clear without evidence of cervical or supraclavicular adenopathy. Lungs are clear to A&P. Cardiac examination is essentially unremarkable with regular rate and rhythm without murmur rub or thrill. Abdomen is benign with no organomegaly or masses noted. Motor sensory and DTR levels are equal and symmetric in the upper and lower extremities. Cranial nerves II through XII are grossly intact. Proprioception is intact. No  peripheral adenopathy or edema is identified. No motor or sensory levels are noted. Crude visual fields are within normal range.  RADIOLOGY RESULTS: Mammograms and ultrasound reviewed compatible with above-stated findings  PLAN: At the present time patient is doing well with no evidence of disease now out over 3 years from whole breast radiation.  I will turn follow-up care over to medical oncology.  I would be happy to reevaluate the patient in time the future should that be indicated.  She continues on letrozole  without side effect.  Patient knows to call with any concerns.  I would like to take this opportunity to thank you for allowing me to participate in the care of your patient.SABRA Marcey Penton, MD

## 2024-03-26 ENCOUNTER — Inpatient Hospital Stay: Attending: Oncology | Admitting: Oncology

## 2024-03-26 ENCOUNTER — Encounter: Payer: Self-pay | Admitting: Oncology

## 2024-03-26 VITALS — BP 134/83 | HR 88 | Temp 98.9°F | Resp 20 | Ht 64.0 in | Wt 212.0 lb

## 2024-03-26 DIAGNOSIS — Z79899 Other long term (current) drug therapy: Secondary | ICD-10-CM | POA: Diagnosis not present

## 2024-03-26 DIAGNOSIS — Z1721 Progesterone receptor positive status: Secondary | ICD-10-CM | POA: Insufficient documentation

## 2024-03-26 DIAGNOSIS — Z881 Allergy status to other antibiotic agents status: Secondary | ICD-10-CM | POA: Diagnosis not present

## 2024-03-26 DIAGNOSIS — Z83438 Family history of other disorder of lipoprotein metabolism and other lipidemia: Secondary | ICD-10-CM | POA: Insufficient documentation

## 2024-03-26 DIAGNOSIS — N6019 Diffuse cystic mastopathy of unspecified breast: Secondary | ICD-10-CM | POA: Diagnosis not present

## 2024-03-26 DIAGNOSIS — Z87442 Personal history of urinary calculi: Secondary | ICD-10-CM | POA: Diagnosis not present

## 2024-03-26 DIAGNOSIS — Z923 Personal history of irradiation: Secondary | ICD-10-CM | POA: Diagnosis not present

## 2024-03-26 DIAGNOSIS — C50212 Malignant neoplasm of upper-inner quadrant of left female breast: Secondary | ICD-10-CM | POA: Insufficient documentation

## 2024-03-26 DIAGNOSIS — Z833 Family history of diabetes mellitus: Secondary | ICD-10-CM | POA: Diagnosis not present

## 2024-03-26 DIAGNOSIS — Z8249 Family history of ischemic heart disease and other diseases of the circulatory system: Secondary | ICD-10-CM | POA: Diagnosis not present

## 2024-03-26 DIAGNOSIS — Z8042 Family history of malignant neoplasm of prostate: Secondary | ICD-10-CM | POA: Diagnosis not present

## 2024-03-26 DIAGNOSIS — Z79811 Long term (current) use of aromatase inhibitors: Secondary | ICD-10-CM | POA: Diagnosis not present

## 2024-03-26 DIAGNOSIS — Z8616 Personal history of COVID-19: Secondary | ICD-10-CM | POA: Insufficient documentation

## 2024-03-26 DIAGNOSIS — Z803 Family history of malignant neoplasm of breast: Secondary | ICD-10-CM | POA: Insufficient documentation

## 2024-03-26 DIAGNOSIS — Z1732 Human epidermal growth factor receptor 2 negative status: Secondary | ICD-10-CM | POA: Insufficient documentation

## 2024-03-26 DIAGNOSIS — Z170 Estrogen receptor positive status [ER+]: Secondary | ICD-10-CM | POA: Diagnosis not present

## 2024-03-26 NOTE — Progress Notes (Unsigned)
 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  Oncotype DX score 3.  INTERVAL HISTORY: Patient returns to clinic today for routine 37-month evaluation.  She currently feels well and is asymptomatic.  She continues to tolerate letrozole  without significant side effects.  She has no neurologic complaints.  She denies any recent fevers or illnesses.  She has a good appetite and denies weight loss.  She has no chest pain, shortness of breath, cough, or hemoptysis.  She denies any nausea, vomiting, constipation, or diarrhea.  She has no urinary complaints.  Patient offers no specific complaints today.  REVIEW OF SYSTEMS:   Review of Systems  Constitutional: Negative.  Negative for fever, malaise/fatigue and weight loss.  Respiratory: Negative.  Negative for cough, hemoptysis and shortness of breath.   Cardiovascular: Negative.  Negative for chest pain and leg swelling.  Gastrointestinal: Negative.  Negative for abdominal pain.  Genitourinary: Negative.  Negative for dysuria.  Musculoskeletal: Negative.  Negative for back pain.  Skin: Negative.  Negative for rash.  Neurological: Negative.  Negative for dizziness, focal weakness and weakness.  Psychiatric/Behavioral: Negative.  The patient is not nervous/anxious.     As per HPI. Otherwise, a complete review of systems is negative.  PAST MEDICAL HISTORY: Past Medical History:  Diagnosis Date  . Anxiety    due to Covid   . Cardiac disease    d/t family history of cardiac issues  . Depression    due to Covid    . Diffuse cystic mastopathy   . Family history of breast cancer   . Family history of malignant neoplasm of breast   . Family history of prostate cancer   . Graves disease    In remission  . History of kidney stones    history of stone in past  . Hyperlipidemia   . Hyperthyroidism    remission  . Personal history of radiation therapy    left breast 2022  . PONV (postoperative nausea and vomiting)     PAST SURGICAL HISTORY: Past Surgical History:  Procedure Laterality Date  . BREAST BIOPSY Left 07/2011   aspiration cyst  . BREAST BIOPSY Left 01/2020   Sclerosing intraductal papilloma with apocrine metaplasia. No atypia or malignancy.  SABRA BREAST BIOPSY Left 07/15/2020   us  bx, heart marker, path pending  . BREAST DUCTAL SYSTEM EXCISION Left 08/09/2020   Procedure: EXCISION DUCTAL SYSTEM BREAST, left intraductal papilloma;  Surgeon: Marolyn Nest, MD;  Location: [facility] ORS;  Service: General;  Laterality: Left;  . BREAST EXCISIONAL BIOPSY Right    2008 benign  . BREAST LUMPECTOMY Left 09/13/2020   Procedure: BREAST LUMPECTOMY, re-excision;  Surgeon: Marolyn Nest, MD;  Location: [facility] OR[facility];  Service: General;  Laterality: Left;  . BREAST LUMPECTOMY Left 10/08/2020   Procedure: BREAST LUMPECTOMY, re-excision;  Surgeon: Marolyn Nest, MD;  Location: [facility] OR[facility];  Service: General;  Laterality: Left;  . BREAST LUMPECTOMY,RADIO FREQ LOCALIZER,AXILLARY SENTINEL LYMPH NODE BIOPSY Left 08/09/2020   Procedure: BREAST LUMPECTOMY,RADIO FREQ LOCALIZER,AXILLARY SENTINEL LYMPH NODE BIOPSY;  Surgeon: Marolyn Nest, MD;  Location: [facility];  Service: General;  Laterality: Left;  . COLONOSCOPY WITH  PROPOFOL  N/A 07/12/2020   Procedure: COLONOSCOPY WITH PROPOFOL ;  Surgeon: Jinny Carmine, MD;  Location: [facility];  Service: Endoscopy;  Laterality: N/A;  PRIORITY 4  . DILATION AND CURETTAGE OF UTERUS  2011  . eyelid surgery     . ORBITAL RECONSTRUCTION Left 2008   graves disease  caused bug eyed  . POLYPECTOMY  07/12/2020   Procedure: POLYPECTOMY;  Surgeon: Jinny Carmine, MD;  Location: [facility];  Service: Endoscopy;;  . WISDOM TOOTH EXTRACTION      FAMILY HISTORY: Family History  Problem Relation Age of Onset  . Diabetes Mother   . Hypertension Mother   . Hyperlipidemia Mother   . Hypertension Father   . Hyperlipidemia Father   . Hypothyroidism Father   . Prostate cancer Father 18  . Hyperlipidemia Sister   . Hypertension Sister   . Hyperlipidemia Sister   . Hypertension Sister   . Cancer Sister   . Diabetes Sister   . Hyperlipidemia Sister   . Hypertension Sister   . Breast cancer Maternal Grandmother 37    ADVANCED DIRECTIVES (Y/N):  N  HEALTH MAINTENANCE: Social History   Tobacco Use  . Smoking status: Never  . Smokeless tobacco: Never  Vaping Use  . Vaping status: Never Used  Substance Use Topics  . Alcohol use: Yes    Comment: very rarely (maybe once a year)  . Drug use: No     Colonoscopy:  PAP:  Bone density:  Lipid panel:  Allergies  Allergen Reactions  . Polytrim  [Polymyxin B -Trimethoprim ] Rash    Was used on eyes after surgery which gave her a rash     Current Outpatient Medications  Medication Sig Dispense Refill  . acetaminophen  (TYLENOL ) 650 MG CR tablet Take 1,300 mg by mouth every 8 (eight) hours as needed for pain.    . Artificial Tear Solution (TEARS NATURALE OP) Place 1 drop into both eyes daily.    . Calcium Carbonate-Vitamin D  (CALCIUM 600+D PO) Take 2 tablets by mouth daily.    SABRA CRANBERRY PO Take 2 tablets by mouth daily.    SABRA FIBER PO Take 1 capsule by mouth daily. Gummy    . fish oil-omega-3 fatty acids 1000 MG capsule Take 2 g by mouth daily.    . Garlic Oil 1000 MG CAPS Take 1,000 mg by mouth daily.    . ibuprofen  (ADVIL ) 200 MG tablet Take 200 mg by mouth every 4 (four) hours as needed.    . Ibuprofen -diphenhydrAMINE HCl (ADVIL  PM) 200-25 MG CAPS Take 1 tablet by mouth at bedtime as needed  (sleep).    . letrozole  (FEMARA ) 2.5 MG tablet TAKE 1 TABLET(2.5 MG) BY MOUTH DAILY 90 tablet 1  . loratadine (CLARITIN) 10 MG tablet Take 10 mg by mouth daily as needed for allergies.    . Multiple Vitamin (MULTIVITAMIN WITH MINERALS) TABS tablet Take 1 tablet by mouth daily. Women's One A Day    . naproxen  (NAPROSYN ) 500 MG tablet Take 1 tablet (500 mg total) by mouth 2 (two) times daily. 30 tablet 0  . Saccharomyces boulardii (PROBIOTIC) 250 MG CAPS     . vitamin B-12 (CYANOCOBALAMIN) 1000 MCG tablet Take 1,000 mcg by mouth daily.    SABRA White Petrolatum-Mineral Oil (GENTEAL TEARS NIGHT-TIME) OINT Apply 1 drop to eye at bedtime.     No current facility-administered medications for this visit.    OBJECTIVE: Vitals:   03/26/24 1104  BP: 134/83  Pulse: 88  Resp: 20  Temp: 98.9 F (37.2 C)  SpO2: 98%      Body mass index is 36.39 kg/m.    ECOG FS:0 - Asymptomatic  General: Well-developed, well-nourished, no acute distress. Eyes: Pink conjunctiva, anicteric sclera. HEENT: Normocephalic, moist mucous membranes. Lungs: No audible wheezing or coughing. Heart: Regular rate and rhythm. Abdomen: Soft, nontender, no obvious distention. Musculoskeletal: No edema, cyanosis, or clubbing. Neuro: Alert, answering all questions appropriately. Cranial nerves grossly intact. Skin: No rashes or petechiae noted. Psych: Normal affect.  LAB RESULTS:  Lab Results  Component Value Date   NA 144 05/31/2023   K 5.1 05/31/2023   CL 105 05/31/2023   CO2 23 05/31/2023   GLUCOSE 105 (H) 05/31/2023   BUN 17 05/31/2023   CREATININE 0.60 05/31/2023   CALCIUM 10.4 (H) 05/31/2023   PROT 7.3 05/31/2023   ALBUMIN 4.5 05/31/2023   AST 23 05/31/2023   ALT 29 05/31/2023   ALKPHOS 123 (H) 05/31/2023   BILITOT 0.3 05/31/2023   GFRNONAA 110 05/18/2020   GFRAA 126 05/18/2020    Lab Results  Component Value Date   WBC 9.6 05/31/2023   NEUTROABS 6.6 05/31/2023   HGB 12.8 05/31/2023   HCT 40.3  05/31/2023   MCV 92 05/31/2023   PLT 395 05/31/2023     STUDIES: No results found.  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  Oncotype DX score 3.   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  Patient required 2 reexcision's in order to have negative margins, the most recent on October 08, 2020.  Her Oncotype score was 3 conferring low risk, therefore chemotherapy was not necessary despite her 3+ lymph nodes.  Patient completed adjuvant XRT in July 2022.  Continue letrozole  for a total of 5 years completing treatment in July 2027, although given her node positive disease can consider extending treatment 10 years.  Her most recent bilateral diagnostic mammogram on August 13, 2023 was reported BI-RADS 2.  Repeat in February 2026.  Return to clinic in 6 months for routine evaluation.   Genetic testing: Negative.   Bone health: Bone mineral density on January 08, 2023 was once again reported as normal.  I spent a total of 20 minutes reviewing chart data, face-to-face evaluation with the patient, counseling and coordination of care as detailed above.   Patient expressed understanding and was in agreement with this plan. She also understands that She can call clinic at any time with any questions, concerns, or complaints.    Cancer Staging  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, left [facility]) Staging form: Breast, AJCC 8th Edition - Pathologic stage from 08/25/2020: Stage IA (pT1c, pN1a, cM0, G1, ER+, PR+, HER2-) - Signed by Jacobo Evalene PARAS, MD on 08/25/2020 Stage prefix: Initial diagnosis Histologic grading system: 3 grade system   Evalene PARAS Jacobo, MD   03/26/2024  11:08 AM

## 2024-03-26 NOTE — Progress Notes (Unsigned)
 She would like to know if she should keep getting the COVID 19 booster vaccines? She has been doing ok with the Letrozole .

## 2024-04-04 ENCOUNTER — Other Ambulatory Visit: Payer: Self-pay | Admitting: Oncology

## 2024-04-06 ENCOUNTER — Ambulatory Visit
Admission: EM | Admit: 2024-04-06 | Discharge: 2024-04-06 | Disposition: A | Discharge status: Home / Self Care | Attending: Student | Admitting: Student

## 2024-04-06 ENCOUNTER — Telehealth: Payer: Self-pay | Admitting: Student

## 2024-04-06 DIAGNOSIS — C50912 Malignant neoplasm of unspecified site of left female breast: Secondary | ICD-10-CM

## 2024-04-06 DIAGNOSIS — B9689 Other specified bacterial agents as the cause of diseases classified elsewhere: Secondary | ICD-10-CM

## 2024-04-06 DIAGNOSIS — J329 Chronic sinusitis, unspecified: Secondary | ICD-10-CM | POA: Diagnosis not present

## 2024-04-06 MED ORDER — AMOXICILLIN-POT CLAVULANATE 875-125 MG PO TABS: 1.0000 | ORAL_TABLET | Freq: Two times a day (BID) | ORAL | 0 refills | Status: DC

## 2024-04-06 MED ORDER — TRIAMCINOLONE ACETONIDE 55 MCG/ACT NA AERO: 2.0000 | INHALATION_SPRAY | Freq: Every day | NASAL | 1 refills | Status: DC

## 2024-04-06 NOTE — ED Provider Notes (Signed)
 [facility]    CSN: [id number] Arrival date & time: 04/06/24  0830      History   Chief Complaint Chief Complaint  Patient presents with   Cough    HPI Beth [name] is a 53 y.o. female presenting with viral symptoms for 7 days, getting worse.  Medical history breast cancer, hyperlipidemia.  Patient describes 7 days of scratchy throat, nonproductive cough, throbbing frontal headache, sensation of ear fullness.  The symptoms have gotten worse in the last 24 hours instead of better, with a low-grade fever of 99.7 at home yesterday.  She has taken Mucinex and Advil , last dose of Advil  was about 12 hours ago.  She describes facial pressure, worse over the forehead.  Denies shortness of breath, chest pain, nausea, vomiting, diarrhea.  Does not have a history of pulmonary disease.  Husband also present today.  HPI  Past Medical History:  Diagnosis Date   Anxiety    due to Covid    Cardiac disease    d/t family history of cardiac issues   Depression    due to Covid    Diffuse cystic mastopathy    Family history of breast cancer    Family history of malignant neoplasm of breast    Family history of prostate cancer    Graves disease    In remission   History of kidney stones    history of stone in past   Hyperlipidemia    Hyperthyroidism    remission   Personal history of radiation therapy    left breast 2022   PONV (postoperative nausea and vomiting)     Patient Active Problem List   Diagnosis Date Noted   Dupuytren's disease of palm of right hand 05/20/2021   Genetic testing 09/23/2020   Family history of prostate cancer    Malignant neoplasm of left female breast (HCC)    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, left (HCC) 07/23/2020   Polyp of sigmoid colon    Obesity 04/24/2018   Breast cyst, left 12/14/2017   Fatigue 01/29/2017   Right thyroid  nodule 01/28/2016   Allergic rhinitis 10/23/2014    Graves disease 10/23/2014   Blood glucose elevated 10/23/2014   Family history of breast cancer 09/25/2013   Diffuse cystic mastopathy 09/25/2013   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 No atypia or malignancy.   BREAST BIOPSY Left 07/15/2020   us  bx, heart marker, path pending   BREAST DUCTAL SYSTEM EXCISION Left 08/09/2020   Procedure: EXCISION DUCTAL SYSTEM BREAST, left intraductal papilloma;  Surgeon: Marolyn Nest, MD;  Location: [facility] ORS;  Service: General;  Laterality: Left;   BREAST EXCISIONAL BIOPSY Right    2008 benign   BREAST LUMPECTOMY Left 09/13/2020   Procedure: BREAST LUMPECTOMY, re-excision;  Surgeon: Marolyn Nest, MD;  Location: [facility];  Service: General;  Laterality: Left;   BREAST LUMPECTOMY Left 10/08/2020   Procedure: BREAST LUMPECTOMY, re-excision;  Surgeon: Marolyn Nest, MD;  Location: [facility] OR[facility];  Service: General;  Laterality: Left;   BREAST LUMPECTOMY,RADIO FREQ LOCALIZER,AXILLARY SENTINEL LYMPH NODE BIOPSY Left 08/09/2020   Procedure: BREAST LUMPECTOMY,RADIO FREQ LOCALIZER,AXILLARY SENTINEL LYMPH NODE BIOPSY;  Surgeon: Marolyn Nest, MD;  Location: [facility]  ORS;  Service: General;  Laterality: Left;   COLONOSCOPY WITH PROPOFOL  N/A 07/12/2020   Procedure: COLONOSCOPY WITH PROPOFOL ;  Surgeon: Jinny Carmine, MD;  Location: [facility];  Service: Endoscopy;  Laterality: N/A;  PRIORITY 4   DILATION AND CURETTAGE OF UTERUS  2011   eyelid surgery      ORBITAL RECONSTRUCTION Left 2008   graves disease caused bug eyed   POLYPECTOMY  07/12/2020   Procedure: POLYPECTOMY;  Surgeon: Jinny Carmine, MD;  Location: [facility];  Service: Endoscopy;;   WISDOM TOOTH EXTRACTION       OB History     Gravida  1   Para  1   Term      Preterm      AB      Living  1      SAB      IAB      Ectopic      Multiple      Live Births           Obstetric Comments    FIRST PREGNANCY 57 FIRST MENSTRUAL 11          Home Medications    Prior to Admission medications   Medication Sig Start Date End Date Taking? Authorizing Provider  acetaminophen  (TYLENOL ) 650 MG CR tablet Take 1,300 mg by mouth every 8 (eight) hours as needed for pain. 12/19/19  Yes [provider]  amoxicillin-clavulanate (AUGMENTIN) 875-125 MG tablet Take 1 tablet by mouth every 12 (twelve) hours. 04/06/24  Yes Zareena Willis E, PA-C  Artificial Tear Solution (TEARS NATURALE OP) Place 1 drop into both eyes daily.   Yes [provider]  Calcium Carbonate-Vitamin D  (CALCIUM 600+D PO) Take 2 tablets by mouth daily.   Yes [provider]  CRANBERRY PO Take 2 tablets by mouth daily.   Yes [provider]  FIBER PO Take 1 capsule by mouth daily. Gummy   Yes [provider]  fish oil-omega-3 fatty acids 1000 MG capsule Take 2 g by mouth daily.   Yes [provider]  Garlic Oil 1000 MG CAPS Take 1,000 mg by mouth daily. 06/19/16  Yes [provider]  ibuprofen  (ADVIL ) 200 MG tablet Take 200 mg by mouth every 4 (four) hours as needed.   Yes [provider]  Ibuprofen -diphenhydrAMINE HCl (ADVIL  PM) 200-25 MG CAPS Take 1 tablet by mouth at bedtime as needed (sleep).   Yes [provider]  letrozole  (FEMARA ) 2.5 MG tablet TAKE 1 TABLET(2.5 MG) BY MOUTH DAILY 04/04/24  Yes Finnegan, Timothy J, MD  loratadine (CLARITIN) 10 MG tablet Take 10 mg by mouth daily as needed for allergies.   Yes [provider]  Multiple Vitamin (MULTIVITAMIN WITH MINERALS) TABS tablet Take 1 tablet by mouth daily. Women's One A Day   Yes [provider]  naproxen  (NAPROSYN ) 500 MG tablet Take 1 tablet (500 mg total) by mouth 2  (two) times daily. 11/11/22  Yes Immordino, Garnette, FNP  Saccharomyces boulardii (PROBIOTIC) 250 MG CAPS  07/25/21  Yes [provider]  triamcinolone (NASACORT) 55 MCG/ACT AERO nasal inhaler Place 2 sprays into the nose daily. 04/06/24  Yes Abron Neddo E, PA-C  vitamin B-12 (CYANOCOBALAMIN) 1000 MCG tablet Take 1,000 mcg by mouth daily.   Yes [provider]  White Petrolatum-Mineral Oil (GENTEAL TEARS NIGHT-TIME) OINT Apply 1 drop to eye at bedtime.   Yes [provider]    Family History Family History  Problem Relation Age  of Onset   Diabetes Mother    Hypertension Mother    Hyperlipidemia Mother    Hypertension Father    Hyperlipidemia Father    Hypothyroidism Father    Prostate cancer Father 90   Hyperlipidemia Sister    Hypertension Sister    Hyperlipidemia Sister    Hypertension Sister    Cancer Sister    Diabetes Sister    Hyperlipidemia Sister    Hypertension Sister    Breast cancer Maternal Grandmother 25    Social History Social History   Tobacco Use   Smoking status: Never   Smokeless tobacco: Never  Vaping Use   Vaping status: Never Used  Substance Use Topics   Alcohol use: Yes    Comment: very rarely (maybe once a year)   Drug use: No     Allergies   Polytrim  [polymyxin b -trimethoprim ]   Review of Systems Review of Systems  Constitutional:  Negative for appetite change, chills and fever.  HENT:  Positive for congestion, sinus pain and sore throat. Negative for ear pain, rhinorrhea and sinus pressure.   Eyes:  Negative for redness and visual disturbance.  Respiratory:  Positive for cough. Negative for chest tightness, shortness of breath and wheezing.   Cardiovascular:  Negative for chest pain and palpitations.  Gastrointestinal:  Negative for abdominal pain, constipation, diarrhea, nausea and vomiting.  Genitourinary:  Negative for dysuria, frequency and urgency.  Musculoskeletal:  Negative for myalgias.  Neurological:   Negative for dizziness, weakness and headaches.  Psychiatric/Behavioral:  Negative for confusion.   All other systems reviewed and are negative.    Physical Exam Triage Vital Signs ED Triage Vitals [04/06/24 0841]  Encounter Vitals Group     BP 134/86     Girls Systolic BP Percentile      Girls Diastolic BP Percentile      Boys Systolic BP Percentile      Boys Diastolic BP Percentile      Pulse Rate 96     Resp 18     Temp 99.2 F (37.3 C)     Temp Source Oral     SpO2 96 %     Weight 210 lb (95.3 kg)     Height      Head Circumference      Peak Flow      Pain Score 0     Pain Loc      Pain Education      Exclude from Growth Chart    No data found.  Updated Vital Signs BP 134/86 (BP Location: Right Arm)   Pulse 96   Temp 99.2 F (37.3 C) (Oral)   Resp 18   Wt 210 lb (95.3 kg)   SpO2 96%   BMI 36.05 kg/m   Visual Acuity Right Eye Distance:   Left Eye Distance:   Bilateral Distance:    Right Eye Near:   Left Eye Near:    Bilateral Near:     Physical Exam Vitals reviewed.  Constitutional:      General: She is not in acute distress.    Appearance: Normal appearance. She is not ill-appearing.  HENT:     Head: Normocephalic and atraumatic.     Right Ear: Tympanic membrane, ear canal and external ear normal. No tenderness. No middle ear effusion. There is no impacted cerumen. Tympanic membrane is not perforated, erythematous, retracted or bulging.     Left Ear: Tympanic membrane, ear canal and external ear normal. No tenderness.  No  middle ear effusion. There is no impacted cerumen. Tympanic membrane is not perforated, erythematous, retracted or bulging.     Nose: No congestion.     Right Sinus: Frontal sinus tenderness present. No maxillary sinus tenderness.     Left Sinus: Frontal sinus tenderness present. No maxillary sinus tenderness.     Mouth/Throat:     Mouth: Mucous membranes are moist.     Pharynx: Uvula midline. No oropharyngeal exudate or  posterior oropharyngeal erythema.  Eyes:     Extraocular Movements: Extraocular movements intact.     Pupils: Pupils are equal, round, and reactive to light.  Cardiovascular:     Rate and Rhythm: Normal rate and regular rhythm.     Heart sounds: Normal heart sounds.  Pulmonary:     Effort: Pulmonary effort is normal.     Breath sounds: Normal breath sounds. No decreased breath sounds, wheezing, rhonchi or rales.  Abdominal:     Palpations: Abdomen is soft.     Tenderness: There is no abdominal tenderness. There is no guarding or rebound.  Lymphadenopathy:     Cervical: No cervical adenopathy.     Right cervical: No superficial cervical adenopathy.    Left cervical: No superficial cervical adenopathy.  Neurological:     General: No focal deficit present.     Mental Status: She is alert and oriented to person, place, and time.  Psychiatric:        Mood and Affect: Mood normal.        Behavior: Behavior normal.        Thought Content: Thought content normal.        Judgment: Judgment normal.      UC Treatments / Results  Labs (all labs ordered are listed, but only abnormal results are displayed) Labs Reviewed - No data to display  EKG   Radiology No results found.  Procedures Procedures (including critical care time)  Medications Ordered in UC Medications - No data to display  Initial Impression / Assessment and Plan / UC Course  I have reviewed the triage vital signs and the nursing notes.  Pertinent labs & imaging results that were available during my care of the patient were reviewed by me and considered in my medical decision making (see chart for details).     Patient is a pleasant 53 year old female presenting on day 7 of viral syndrome.  I do have concern for bacterial sinusitis considering symptoms are getting worse in the last 24 hours instead of better, with facial pain.  Additionally, patient with a diagnosis of breast cancer. Will manage with Augmentin,  Nasacort, and she will continue Mucinex and Advil .  Good hydration, rest.  Final Clinical Impressions(s) / UC Diagnoses   Final diagnoses:  Bacterial sinusitis  Malignant neoplasm of left female breast, unspecified estrogen receptor status, unspecified site of breast [facility])     Discharge Instructions      -Start the antibiotic-Augmentin (amoxicillin-clavulanate), 1 pill every 12 hours for 7 days.  You can take this with food like with breakfast and dinner. -Nasacort nasal spray twice daily while sick; once daily when not sick -Continue Mucinex, Advil , etc -Drink plenty of fluids and get plenty of rest     ED Prescriptions     Medication Sig Dispense Auth. Provider   amoxicillin-clavulanate (AUGMENTIN) 875-125 MG tablet Take 1 tablet by mouth every 12 (twelve) hours. 14 tablet Sophya Vanblarcom E, PA-C   triamcinolone (NASACORT) 55 MCG/ACT AERO nasal inhaler Place 2 sprays into the nose  daily. 1 each Arlyss Leita BRAVO, PA-C      PDMP not reviewed this encounter.   Arlyss Leita BRAVO, PA-C 04/06/24 [phone]

## 2024-04-06 NOTE — Discharge Instructions (Addendum)
-  Start the antibiotic-Augmentin (amoxicillin-clavulanate), 1 pill every 12 hours for 7 days.  You can take this with food like with breakfast and dinner. -Nasacort nasal spray twice daily while sick; once daily when not sick -Continue Mucinex, Advil , etc -Drink plenty of fluids and get plenty of rest

## 2024-04-06 NOTE — ED Triage Notes (Signed)
 Patient states  that she's had sx for 6 days  Scratchy throat Cough Sinus pain  Headache Ear fullness  Low grade fever last night. Took advil .

## 2024-05-20 NOTE — Progress Notes (Unsigned)
 Gynecology Annual Exam  PCP: Myrla Jon HERO, MD  Chief Complaint: No chief complaint on file.   History of Present Illness: Patient is a 53 y.o. Beth [name] presents for annual exam. The patient has no complaints today.   LMP: No LMP recorded. (Menstrual status: Perimenopausal). Average Interval: {Desc; regular/irreg:14544}, {numbers 22-35:14824} days Duration of flow: {numbers; 0-10:33138} days Heavy Menses: {yes/no:63} Clots: {yes/no:63} Intermenstrual Bleeding: {yes/no:63} Postcoital Bleeding: {yes/no:63} Dysmenorrhea: {yes/no:63}   The patient {sys sexually active:13135} sexually active. She currently uses {method:5051} for contraception. She {has/denies:315300} dyspareunia.  The patient {DOES_DOES WNU:81435} perform self breast exams.  There {is/is no:19420} notable family history of breast or ovarian cancer in her family.  The patient wears seatbelts: {yes/no:63}.   The patient has regular exercise: {yes/no/not asked:9010}.    The patient {Blank single:19197::reports,denies} current symptoms of depression.    Review of Systems: ROS  Past Medical History:  Patient Active Problem List   Diagnosis Date Noted   Dupuytren's disease of palm of right hand 05/20/2021   Genetic testing 09/23/2020    Negative genetic testing. No pathogenic variants identified on the [facility] CancerNext-Expanded+RNA panel. The report date is 09/23/2020.  The CancerNext-Expanded + RNAinsight gene panel offered by [employer] and includes sequencing and rearrangement analysis for the following 77 genes: IP, ALK, APC*, ATM*, AXIN2, BAP1, BARD1, BLM, BMPR1A, BRCA1*, BRCA2*, BRIP1*, CDC73, CDH1*,CDK4, CDKN1B, CDKN2A, CHEK2*, CTNNA1, DICER1, FANCC, FH, FLCN, GALNT12, KIF1B, LZTR1, MAX, MEN1, MET, MLH1*, MSH2*, MSH3, MSH6*, MUTYH*, NBN, NF1*, NF2, NTHL1, PALB2*, PHOX2B, PMS2*, POT1, PRKAR1A, PTCH1, PTEN*, RAD51C*, RAD51D*,RB1, RECQL, RET, SDHA, SDHAF2, SDHB, SDHC, SDHD, SMAD4, SMARCA4, SMARCB1, SMARCE1,  STK11, SUFU, TMEM127, TP53*,TSC1, TSC2, VHL and XRCC2 (sequencing and deletion/duplication); EGFR, EGLN1, HOXB13, KIT, MITF, PDGFRA, POLD1 and POLE (sequencing only); EPCAM and GREM1 (deletion/duplication only).     Family history of prostate cancer    Malignant neoplasm of left female breast (HCC)     2022. 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 No atypia or malignancy.   BREAST BIOPSY Left 07/15/2020   us  bx, heart marker, path pending   BREAST DUCTAL SYSTEM EXCISION Left 08/09/2020   Procedure: EXCISION DUCTAL SYSTEM BREAST, left intraductal papilloma;  Surgeon: Marolyn Nest, MD;  Location: [facility];  Service: General;  Laterality: Left;   BREAST EXCISIONAL BIOPSY Right    2008 benign   BREAST LUMPECTOMY Left 09/13/2020   Procedure: BREAST LUMPECTOMY, re-excision;  Surgeon: Marolyn Nest, MD;  Location: [facility];  Service: General;  Laterality: Left;   BREAST LUMPECTOMY Left 10/08/2020   Procedure: BREAST LUMPECTOMY, re-excision;  Surgeon: Marolyn Nest, MD;  Location: [facility];  Service:  General;  Laterality: Left;   BREAST LUMPECTOMY,RADIO FREQ LOCALIZER,AXILLARY SENTINEL LYMPH NODE BIOPSY Left 08/09/2020   Procedure: BREAST LUMPECTOMY,RADIO FREQ LOCALIZER,AXILLARY SENTINEL LYMPH NODE BIOPSY;  Surgeon: Marolyn Nest, MD;  Location: [facility];  Service: General;  Laterality: Left;   COLONOSCOPY WITH PROPOFOL  N/A 07/12/2020   Procedure: COLONOSCOPY WITH PROPOFOL ;  Surgeon: Jinny Carmine, MD;  Location: [facility];  Service: Endoscopy;  Laterality: N/A;  PRIORITY 4   DILATION AND CURETTAGE OF UTERUS  2011   eyelid surgery      ORBITAL RECONSTRUCTION Left 2008   graves disease caused bug eyed   POLYPECTOMY  07/12/2020   Procedure: POLYPECTOMY;  Surgeon: Jinny Carmine, MD;  Location: [facility];  Service: Endoscopy;;   WISDOM TOOTH EXTRACTION      Gynecologic History:  No LMP recorded. (Menstrual status: Perimenopausal).  Last Pap: Results were:  {Findings; lab pap smear results:16707::NIL and HR HPV+,NIL and HR HPV negative}  Last mammogram: *** Results were: {Blank single:19197::***,BI-RADS IV,BI-RAD III,BI-RAD II,BI-RAD I}  Obstetric History: Beth [name]  Family History:  Family History  Problem Relation Age of Onset   Diabetes Mother    Hypertension Mother    Hyperlipidemia Mother    Hypertension Father    Hyperlipidemia Father    Hypothyroidism Father    Prostate cancer Father 89   Hyperlipidemia Sister    Hypertension Sister    Hyperlipidemia Sister    Hypertension Sister    Cancer Sister    Diabetes Sister    Hyperlipidemia Sister    Hypertension Sister    Breast cancer Maternal Grandmother 15    Social History:  Social History   Socioeconomic History   Marital status: Married    Spouse name: Acupuncturist   Number of children: Not on file   Years of education: Not on file   Highest education level: Not on file  Occupational History   Occupation: word processing    Comment: full time  Tobacco Use   Smoking status: Never    Smokeless tobacco: Never  Vaping Use   Vaping status: Never Used  Substance and Sexual Activity   Alcohol use: Yes    Comment: very rarely (maybe once a year)   Drug use: No   Sexual activity: Yes    Birth control/protection: None  Other Topics Concern   Not on file  Social History Narrative   Patient lives with husband.    She feels safe in her home.   Social Drivers of Corporate Investment Banker Strain: Not on file  Food Insecurity: Not on file  Transportation Needs: Not on file  Physical Activity: Not on file  Stress: Not on file  Social Connections: Not on file  Intimate Partner Violence: Not on file    Allergies:  Allergies  Allergen Reactions   Polytrim  [Polymyxin B -Trimethoprim ] Rash    Was used on eyes after surgery which gave her a rash     Medications: Prior to Admission medications   Medication Sig Start Date End Date Taking? Authorizing Provider  acetaminophen  (TYLENOL ) 650 MG CR tablet Take 1,300 mg by mouth every 8 (eight) hours as needed for pain. 12/19/19   [provider]  amoxicillin -clavulanate (AUGMENTIN ) 875-125 MG tablet Take 1 tablet by mouth every 12 (twelve) hours. 04/06/24   Graham, Laura E, PA-C  Artificial Tear Solution (TEARS NATURALE OP) Place 1 drop into both eyes daily.    [provider]  Calcium Carbonate-Vitamin D  (CALCIUM 600+D PO) Take 2 tablets by mouth  daily.    [provider]  CRANBERRY PO Take 2 tablets by mouth daily.    [provider]  FIBER PO Take 1 capsule by mouth daily. Gummy    [provider]  fish oil-omega-3 fatty acids 1000 MG capsule Take 2 g by mouth daily.    [provider]  Garlic Oil 1000 MG CAPS Take 1,000 mg by mouth daily. 06/19/16   [provider]  ibuprofen  (ADVIL ) 200 MG tablet Take 200 mg by mouth every 4 (four) hours as needed.    [provider]  Ibuprofen -diphenhydrAMINE HCl (ADVIL  PM) 200-25 MG CAPS Take 1 tablet by mouth at bedtime  as needed (sleep).    [provider]  letrozole  (FEMARA ) 2.5 MG tablet TAKE 1 TABLET(2.5 MG) BY MOUTH DAILY 04/04/24   Finnegan, Timothy J, MD  loratadine (CLARITIN) 10 MG tablet Take 10 mg by mouth daily as needed for allergies.    [provider]  Multiple Vitamin (MULTIVITAMIN WITH MINERALS) TABS tablet Take 1 tablet by mouth daily. Women's One A Day    [provider]  naproxen  (NAPROSYN ) 500 MG tablet Take 1 tablet (500 mg total) by mouth 2 (two) times daily. 11/11/22   Immordino, Garnette, FNP  Saccharomyces boulardii (PROBIOTIC) 250 MG CAPS  07/25/21   [provider]  triamcinolone  (NASACORT ) 55 MCG/ACT AERO nasal inhaler Place 2 sprays into the nose daily. 04/06/24   Graham, Laura E, PA-C  vitamin B-12 (CYANOCOBALAMIN) 1000 MCG tablet Take 1,000 mcg by mouth daily.    [provider]  White Petrolatum-Mineral Oil (GENTEAL TEARS NIGHT-TIME) OINT Apply 1 drop to eye at bedtime.    [provider]    Physical Exam Vitals: There were no vitals taken for this visit.  General: NAD HEENT: normocephalic, anicteric Thyroid : no enlargement, no palpable nodules Pulmonary: No increased work of breathing, CTAB Cardiovascular: RRR, distal pulses 2+ Breast: Breast symmetrical, no tenderness, no palpable nodules or masses, no skin or nipple retraction present, no nipple discharge.  No axillary or supraclavicular lymphadenopathy. Abdomen: NABS, soft, non-tender, non-distended.  Umbilicus without lesions.  No hepatomegaly, splenomegaly or masses palpable. No evidence of hernia  Genitourinary:  External: Normal external female genitalia.  Normal urethral meatus, normal Bartholin's and Skene's glands.    Vagina: Normal vaginal mucosa, no evidence of prolapse.    Cervix: Grossly normal in appearance, no bleeding  Uterus: Non-enlarged, mobile, normal contour.  No CMT  Adnexa: ovaries non-enlarged, no adnexal masses  Rectal: deferred  Lymphatic: no  evidence of inguinal lymphadenopathy Extremities: no edema, erythema, or tenderness Neurologic: Grossly intact Psychiatric: mood appropriate, affect full   Assessment: 53 y.o. Beth [name] routine annual exam  Plan: Problem List Items Addressed This Visit   None   1) Mammogram - recommend yearly screening mammogram.  Mammogram {Blank single:19197::Is up to date,Was ordered today}   2) STI screening  {Blank single:19197::was,was not}offered and {Blank single:19197::accepted,declined,therefore not obtained}  3) ASCCP guidelines and rational discussed.  Patient opts for {Blank single:19197::***,every 5 years,every 3 years,yearly,discontinue age >65,discontinue secondary to prior hysterectomy} screening interval  4) Contraception - the patient is currently using  {method:5051}.  She is {Blank single:19197::happy with her current form of contraception and plans to continue,interested in changing to ***,interested in starting Contraception: ***,not currently in need of contraception secondary to being sterile,attempting to conceive in the near future}  5) Colonoscopy -- Screening recommended starting at age 85 for average risk individuals, age 74 for individuals deemed at increased risk (  including African Americans) and recommended to continue until age 34.  For patient age 29-85 individualized approach is recommended.  Gold standard screening is via colonoscopy, Cologuard screening is an acceptable alternative for patient unwilling or unable to undergo colonoscopy.  Colorectal cancer screening for average?risk adults: 2018 guideline update from the American Cancer SocietyCA: A Cancer Journal for Clinicians: May Beth, 2018   6) Routine healthcare maintenance including cholesterol, diabetes screening discussed {Blank single:19197::managed by PCP,Ordered today,To return fasting at a later date,Declines}  7) No follow-ups on file.   Slater Rains, CNM [facility] 05/20/2024 5:05 PM

## 2024-05-20 NOTE — Patient Instructions (Signed)
 Preventive Care 54-54 Years Old, Female  Preventive care refers to lifestyle choices and visits with your health care provider that can promote health and wellness. Preventive care visits are also called wellness exams.  What can I expect for my preventive care visit?  Counseling  Your health care provider may ask you questions about your:  Medical history, including:  Past medical problems.  Family medical history.  Pregnancy history.  Current health, including:  Menstrual cycle.  Method of birth control.  Emotional well-being.  Home life and relationship well-being.  Sexual activity and sexual health.  Lifestyle, including:  Alcohol, nicotine or tobacco, and drug use.  Access to firearms.  Diet, exercise, and sleep habits.  Work and work Astronomer.  Sunscreen use.  Safety issues such as seatbelt and bike helmet use.  Physical exam  Your health care provider will check your:  Height and weight. These may be used to calculate your BMI (body mass index). BMI is a measurement that tells if you are at a healthy weight.  Waist circumference. This measures the distance around your waistline. This measurement also tells if you are at a healthy weight and may help predict your risk of certain diseases, such as type 2 diabetes and high blood pressure.  Heart rate and blood pressure.  Body temperature.  Skin for abnormal spots.  What immunizations do I need?    Vaccines are usually given at various ages, according to a schedule. Your health care provider will recommend vaccines for you based on your age, medical history, and lifestyle or other factors, such as travel or where you work.  What tests do I need?  Screening  Your health care provider may recommend screening tests for certain conditions. This may include:  Lipid and cholesterol levels.  Diabetes screening. This is done by checking your blood sugar (glucose) after you have not eaten for a while (fasting).  Pelvic exam and Pap test.  Hepatitis B test.  Hepatitis C  test.  HIV (human immunodeficiency virus) test.  STI (sexually transmitted infection) testing, if you are at risk.  Lung cancer screening.  Colorectal cancer screening.  Mammogram. Talk with your health care provider about when you should start having regular mammograms. This may depend on whether you have a family history of breast cancer.  BRCA-related cancer screening. This may be done if you have a family history of breast, ovarian, tubal, or peritoneal cancers.  Bone density scan. This is done to screen for osteoporosis.  Talk with your health care provider about your test results, treatment options, and if necessary, the need for more tests.  Follow these instructions at home:  Eating and drinking    Eat a diet that includes fresh fruits and vegetables, whole grains, lean protein, and low-fat dairy products.  Take vitamin and mineral supplements as recommended by your health care provider.  Do not drink alcohol if:  Your health care provider tells you not to drink.  You are pregnant, may be pregnant, or are planning to become pregnant.  If you drink alcohol:  Limit how much you have to 0-1 drink a day.  Know how much alcohol is in your drink. In the [place] one drink equals one 12 oz bottle of beer (355 mL), one 5 oz glass of wine (148 mL), or one 1 oz glass of hard liquor (44 mL).  Lifestyle  Brush your teeth every morning and night with fluoride toothpaste. Floss one time each day.  Exercise for at least  30 minutes 5 or more days each week.  Do not use any products that contain nicotine or tobacco. These products include cigarettes, chewing tobacco, and vaping devices, such as e-cigarettes. If you need help quitting, ask your health care provider.  Do not use drugs.  If you are sexually active, practice safe sex. Use a condom or other form of protection to prevent STIs.  If you do not wish to become pregnant, use a form of birth control. If you plan to become pregnant, see your health care provider for a  prepregnancy visit.  Take aspirin only as told by your health care provider. Make sure that you understand how much to take and what form to take. Work with your health care provider to find out whether it is safe and beneficial for you to take aspirin daily.  Find healthy ways to manage stress, such as:  Meditation, yoga, or listening to music.  Journaling.  Talking to a trusted person.  Spending time with friends and family.  Minimize exposure to UV radiation to reduce your risk of skin cancer.  Safety  Always wear your seat belt while driving or riding in a vehicle.  Do not drive:  If you have been drinking alcohol. Do not ride with someone who has been drinking.  When you are tired or distracted.  While texting.  If you have been using any mind-altering substances or drugs.  Wear a helmet and other protective equipment during sports activities.  If you have firearms in your house, make sure you follow all gun safety procedures.  Seek help if you have been physically or sexually abused.  What's next?  Visit your health care provider once a year for an annual wellness visit.  Ask your health care provider how often you should have your eyes and teeth checked.  Stay up to date on all vaccines.  This information is not intended to replace advice given to you by your health care provider. Make sure you discuss any questions you have with your health care provider.  Document Revised: 12/01/2020 Document Reviewed: 12/01/2020  Elsevier Patient Education  2024 [employer].

## 2024-05-21 ENCOUNTER — Encounter: Payer: Self-pay | Admitting: Advanced Practice Midwife

## 2024-05-21 ENCOUNTER — Ambulatory Visit: Admitting: Advanced Practice Midwife

## 2024-05-21 VITALS — BP 128/79 | HR 73 | Ht 64.0 in | Wt 209.0 lb

## 2024-05-21 DIAGNOSIS — Z01419 Encounter for gynecological examination (general) (routine) without abnormal findings: Secondary | ICD-10-CM

## 2024-06-02 ENCOUNTER — Ambulatory Visit (INDEPENDENT_AMBULATORY_CARE_PROVIDER_SITE_OTHER): Payer: Self-pay | Admitting: Family Medicine

## 2024-06-02 ENCOUNTER — Encounter: Payer: Self-pay | Admitting: Family Medicine

## 2024-06-02 VITALS — BP 129/74 | HR 72 | Ht 64.0 in | Wt 210.9 lb

## 2024-06-02 DIAGNOSIS — F4321 Adjustment disorder with depressed mood: Secondary | ICD-10-CM | POA: Diagnosis not present

## 2024-06-02 DIAGNOSIS — E66812 Obesity, class 2: Secondary | ICD-10-CM | POA: Diagnosis not present

## 2024-06-02 DIAGNOSIS — R7303 Prediabetes: Secondary | ICD-10-CM | POA: Diagnosis not present

## 2024-06-02 DIAGNOSIS — C50912 Malignant neoplasm of unspecified site of left female breast: Secondary | ICD-10-CM

## 2024-06-02 DIAGNOSIS — Z0000 Encounter for general adult medical examination without abnormal findings: Secondary | ICD-10-CM | POA: Diagnosis not present

## 2024-06-02 DIAGNOSIS — Z789 Other specified health status: Secondary | ICD-10-CM | POA: Diagnosis not present

## 2024-06-02 DIAGNOSIS — Z6836 Body mass index (BMI) 36.0-36.9, adult: Secondary | ICD-10-CM

## 2024-06-02 DIAGNOSIS — E7800 Pure hypercholesterolemia, unspecified: Secondary | ICD-10-CM | POA: Diagnosis not present

## 2024-06-02 DIAGNOSIS — E041 Nontoxic single thyroid nodule: Secondary | ICD-10-CM | POA: Diagnosis not present

## 2024-06-02 MED ORDER — SERTRALINE HCL 50 MG PO TABS: 50.0000 mg | ORAL_TABLET | Freq: Every day | ORAL | 3 refills | Status: AC

## 2024-06-02 NOTE — Progress Notes (Signed)
 Complete physical exam   Patient: Beth [name]   DOB: 11-09-1970   53 y.o. Female  MRN: [id number] Visit Date: 06/02/2024  Today's healthcare provider: Jon Eva, MD   Chief Complaint  Patient presents with   Annual Exam    Last completed 05/31/23 Diet - Trying to eat healthy Exercise - none Feeling - well Sleeping - fairly well Concerns - struggling with depression more than before   Subjective    Beth [name] is a 53 y.o. female who presents today for a complete physical exam.   Discussed the use of AI scribe software for clinical note transcription with the patient, who gave verbal consent to proceed.  History of Present Illness   Beth [name] is a 53 year old female who presents with concerns about mood and depression.  She reports mood changes and depression linked to burnout at work, a new boss, and a coworkers impending retirement, worsened by the holiday season. Her mood fluctuates, and she sometimes needs to cry before feeling better. She has poor sleep and often leaves the bedroom to sleep on the sofa.  She has breast cancer treated with letrozole , which she tolerates without major issues. She notes hot feet at night that she wonders could be from perimenopause or letrozole .  She is worried about her cholesterol and possible need for medication. She has a family history of thyroid  problems in her father. She is trying to eat healthier and plans to return to the gym to help with weight, which she finds harder to lose at her age.  She has intermittent constipation managed with daily gummy fiber and uses Miralax as needed. She skipped fiber today due to fasting for labs. She denies abdominal pain.          06/02/2024    8:29 AM 02/27/2024    9:18 AM 05/31/2023    8:50 AM 07/17/2022    2:06 PM 05/24/2022    9:33 AM  Depression screen PHQ 2/9  Decreased Interest 0 0 1 0 0  Down, Depressed, Hopeless 2 0 3 1 1   PHQ - 2 Score 2 0 4 1 1   Altered  sleeping 1  1 0 1  Tired, decreased energy 3  3 1 1   Change in appetite 0  1 0 0  Feeling bad or failure about yourself  0  0 0 0  Trouble concentrating 1  1 1 1   Moving slowly or fidgety/restless 0  0 0 0  Suicidal thoughts 0  0 0 0  PHQ-9 Score 7  10  3  4    Difficult doing work/chores Somewhat difficult  Somewhat difficult Not difficult at all Not difficult at all     Data saved with a previous flowsheet row definition      06/02/2024    8:29 AM  GAD 7 : Generalized Anxiety Score  Nervous, Anxious, on Edge 1  Control/stop worrying 0  Worry too much - different things 1  Trouble relaxing 0  Restless 0  Easily annoyed or irritable 0  Afraid - awful might happen 0  Total GAD 7 Score 2  Anxiety Difficulty Not difficult at all     Last fall risk screening    06/02/2024    8:29 AM  Fall Risk   Falls in the past year? 0  Number falls in past yr: 0  Injury with Fall? 0  Risk for fall due to : No Fall Risks  Follow  up Falls evaluation completed        Medications: Show/hide medication list[1]  Review of Systems    Objective    BP 129/74 (BP Location: Right Arm, Patient Position: Sitting, Cuff Size: Normal)   Pulse 72   Ht 5' 4 (1.626 m)   Wt 210 lb 14.4 oz (95.7 kg)   LMP 12/02/2022   SpO2 98%   BMI 36.20 kg/m    Physical Exam Vitals reviewed.  Constitutional:      General: She is not in acute distress.    Appearance: Normal appearance. She is well-developed. She is not diaphoretic.  HENT:     Head: Normocephalic and atraumatic.     Right Ear: Tympanic membrane, ear canal and external ear normal.     Left Ear: Tympanic membrane, ear canal and external ear normal.     Nose: Nose normal.     Mouth/Throat:     Mouth: Mucous membranes are moist.     Pharynx: Oropharynx is clear. No oropharyngeal exudate.  Eyes:     General: No scleral icterus.    Conjunctiva/sclera: Conjunctivae normal.     Pupils: Pupils are equal, round, and reactive to light.   Neck:     Thyroid : No thyromegaly.  Cardiovascular:     Rate and Rhythm: Normal rate and regular rhythm.     Heart sounds: Normal heart sounds. No murmur heard. Pulmonary:     Effort: Pulmonary effort is normal. No respiratory distress.     Breath sounds: Normal breath sounds. No wheezing or rales.  Abdominal:     General: There is no distension.     Palpations: Abdomen is soft.     Tenderness: There is no abdominal tenderness.  Musculoskeletal:        General: No deformity.     Cervical back: Neck supple.     Right lower leg: No edema.     Left lower leg: No edema.  Lymphadenopathy:     Cervical: No cervical adenopathy.  Skin:    General: Skin is warm and dry.     Findings: No rash.  Neurological:     Mental Status: She is alert and oriented to person, place, and time. Mental status is at baseline.     Gait: Gait normal.  Psychiatric:        Mood and Affect: Mood normal.        Behavior: Behavior normal.        Thought Content: Thought content normal.      No results found for any visits on 06/02/24.  Assessment & Plan    Routine Health Maintenance and Physical Exam  Exercise Activities and Dietary recommendations  Goals      Exercise 150 minutes per week (moderate activity)        Immunization History  Administered Date(s) Administered   Influenza-Unspecified 04/05/2017, 03/19/2020, 04/03/2021, 04/13/2022, 04/03/2023, 04/18/2024   MODERNA COVID-19 SARS-COV-2 PEDS BIVALENT BOOSTER 26yr-28yr 04/11/2021   Moderna Covid-19 Vaccine Bivalent Booster 68yrs & up 04/30/2022   Moderna Sars-Covid-2 Vaccination 08/23/2019, 09/27/2019, 05/20/2020   PNEUMOCOCCAL CONJUGATE-20 05/20/2021   Td 04/24/2018   Tdap 01/31/2008   Zoster Recombinant(Shingrix ) 05/24/2022, 09/13/2022    Health Maintenance  Topic Date Due   Hepatitis B Vaccines 19-59 Average Risk (1 of 3 - 19+ 3-dose series) Never done   COVID-19 Vaccine (6 - 2025-26 season) 02/18/2024   Mammogram  08/12/2024    Bone Density Scan  01/07/2025   Cervical Cancer Screening (HPV/Pap Cotest)  04/07/2026  Colonoscopy  07/13/2027   DTaP/Tdap/Td (3 - Td or Tdap) 04/24/2028   Pneumococcal Vaccine: 50+ Years  Completed   Influenza Vaccine  Completed   Hepatitis C Screening  Completed   HIV Screening  Completed   Zoster Vaccines- Shingrix   Completed   HPV VACCINES  Aged Out   Meningococcal B Vaccine  Aged Out    Discussed health benefits of physical activity, and encouraged her to engage in regular exercise appropriate for her age and condition.  Problem List Items Addressed This Visit       Endocrine   Right thyroid  nodule   Cleared by ENT Continue to monitor TSH      Relevant Orders   TSH     Other   Hypercholesteremia   Cholesterol levels likely influenced by genetics. Discussed potential need for cholesterol medication in the future to prevent cardiovascular events. - Ordered labs to assess cholesterol levels. - Will discuss potential need for cholesterol medication based on lab results.      Relevant Orders   Lipid panel   Comprehensive metabolic panel with GFR   Prediabetes   Aware of prediabetic status and potential progression to diabetes. Discussed lifestyle modifications and potential future need for medication. - Ordered labs to assess blood sugar levels. - Discussed lifestyle modifications to manage prediabetes.      Relevant Orders   Hemoglobin A1c   Obesity   Challenges with weight management, especially with age. Discussed importance of diet and exercise in managing weight and potential future need for medication. - Encouraged healthy eating and regular exercise. - Discussed potential future need for weight management medication.      Relevant Orders   CBC w/Diff/Platelet   Malignant neoplasm of left female breast (HCC)   Currently on letrozole  for breast cancer with no significant side effects. Oncology follow-up every six months.  - Continue letrozole  therapy. -  Continue oncology follow-up every six months. - Ensure yearly mammogram is scheduled for February.      Relevant Orders   CBC w/Diff/Platelet   Adjustment disorder with depressed mood   Experiencing mood changes and sleep disturbances due to work-related stress and changes. Symptoms include crying spells and difficulty sleeping, consistent with adjustment disorder with depressed mood. No prior medication for mood disorders. - Prescribed sertraline  50 mg daily. - Scheduled follow-up in six weeks to assess response to medication, can be virtual or in-person.      Other Visit Diagnoses       Encounter for annual physical exam    -  Primary   Relevant Orders   Hepatitis B Surface AntiBODY   Lipid panel   TSH   Comprehensive metabolic panel with GFR   CBC w/Diff/Platelet   Hemoglobin A1c     Hepatitis B vaccination status unknown       Relevant Orders   Hepatitis B Surface AntiBODY           General Health Maintenance Up to date on most vaccinations and screenings. Discussed hepatitis B immunity status and potential need for vaccination. - Ordered hepatitis B immunity test. - Continue routine health maintenance screenings as scheduled.       Return in about 6 weeks (around 07/14/2024) for MDD/GAD f/u, virtual ok.     Jon Eva, MD  [facility] [phone] (phone) [phone] (fax)  [facility]     [1]  Outpatient Medications Prior to Visit  Medication Sig   acetaminophen  (TYLENOL ) 650 MG CR tablet  Take 1,300 mg by mouth every 8 (eight) hours as needed for pain.   Artificial Tear Solution (TEARS NATURALE OP) Place 1 drop into both eyes daily.   Calcium Carbonate-Vitamin D  (CALCIUM 600+D PO) Take 2 tablets by mouth daily.   CRANBERRY PO Take 2 tablets by mouth daily.   FIBER PO Take 1 capsule by mouth daily. Gummy   fish oil-omega-3 fatty acids 1000 MG capsule Take 2 g by mouth daily.   Garlic Oil 1000 MG CAPS Take  1,000 mg by mouth daily.   ibuprofen  (ADVIL ) 200 MG tablet Take 200 mg by mouth every 4 (four) hours as needed.   Ibuprofen -diphenhydrAMINE HCl (ADVIL  PM) 200-25 MG CAPS Take 1 tablet by mouth at bedtime as needed (sleep).   letrozole  (FEMARA ) 2.5 MG tablet TAKE 1 TABLET(2.5 MG) BY MOUTH DAILY   loratadine (CLARITIN) 10 MG tablet Take 10 mg by mouth daily as needed for allergies.   Multiple Vitamin (MULTIVITAMIN WITH MINERALS) TABS tablet Take 1 tablet by mouth daily. Women's One A Day   naproxen  (NAPROSYN ) 500 MG tablet Take 1 tablet (500 mg total) by mouth 2 (two) times daily.   vitamin B-12 (CYANOCOBALAMIN) 1000 MCG tablet Take 1,000 mcg by mouth daily.   White Petrolatum-Mineral Oil (GENTEAL TEARS NIGHT-TIME) OINT Apply 1 drop to eye at bedtime.   No facility-administered medications prior to visit.

## 2024-06-02 NOTE — Assessment & Plan Note (Signed)
 Challenges with weight management, especially with age. Discussed importance of diet and exercise in managing weight and potential future need for medication. - Encouraged healthy eating and regular exercise. - Discussed potential future need for weight management medication.

## 2024-06-02 NOTE — Assessment & Plan Note (Signed)
 Cleared by ENT Continue to monitor TSH

## 2024-06-02 NOTE — Assessment & Plan Note (Signed)
 Currently on letrozole  for breast cancer with no significant side effects. Oncology follow-up every six months.  - Continue letrozole  therapy. - Continue oncology follow-up every six months. - Ensure yearly mammogram is scheduled for February.

## 2024-06-02 NOTE — Assessment & Plan Note (Signed)
 Experiencing mood changes and sleep disturbances due to work-related stress and changes. Symptoms include crying spells and difficulty sleeping, consistent with adjustment disorder with depressed mood. No prior medication for mood disorders. - Prescribed sertraline  50 mg daily. - Scheduled follow-up in six weeks to assess response to medication, can be virtual or in-person.

## 2024-06-02 NOTE — Assessment & Plan Note (Signed)
 Aware of prediabetic status and potential progression to diabetes. Discussed lifestyle modifications and potential future need for medication. - Ordered labs to assess blood sugar levels. - Discussed lifestyle modifications to manage prediabetes.

## 2024-06-02 NOTE — Assessment & Plan Note (Signed)
 Cholesterol levels likely influenced by genetics. Discussed potential need for cholesterol medication in the future to prevent cardiovascular events. - Ordered labs to assess cholesterol levels. - Will discuss potential need for cholesterol medication based on lab results.

## 2024-06-03 ENCOUNTER — Ambulatory Visit: Payer: Self-pay | Admitting: Family Medicine

## 2024-06-06 LAB — COMPREHENSIVE METABOLIC PANEL WITH GFR
ALT: 24 IU/L (ref 0–32)
AST: 20 IU/L (ref 0–40)
Albumin: 4.5 g/dL (ref 3.8–4.9)
Alkaline Phosphatase: 141 IU/L — ABNORMAL HIGH (ref 49–135)
BUN/Creatinine Ratio: 21 (ref 9–23)
BUN: 12 mg/dL (ref 6–24)
Bilirubin Total: 0.3 mg/dL (ref 0.0–1.2)
CO2: 24 mmol/L (ref 20–29)
Calcium: 9.6 mg/dL (ref 8.7–10.2)
Chloride: 102 mmol/L (ref 96–106)
Creatinine, Ser: 0.56 mg/dL — ABNORMAL LOW (ref 0.57–1.00)
Globulin, Total: 2.4 g/dL (ref 1.5–4.5)
Glucose: 106 mg/dL — ABNORMAL HIGH (ref 70–99)
Potassium: 4.2 mmol/L (ref 3.5–5.2)
Sodium: 143 mmol/L (ref 134–144)
Total Protein: 6.9 g/dL (ref 6.0–8.5)
eGFR: 108 mL/min/1.73 (ref >59)

## 2024-06-06 LAB — LIPID PANEL
Chol/HDL Ratio: 3.7 ratio (ref 0.0–4.4)
Cholesterol, Total: 221 mg/dL — ABNORMAL HIGH (ref 100–199)
HDL: 60 mg/dL (ref >39)
LDL Chol Calc (NIH): 144 mg/dL — ABNORMAL HIGH (ref 0–99)
Triglycerides: 94 mg/dL (ref 0–149)
VLDL Cholesterol Cal: 17 mg/dL (ref 5–40)

## 2024-06-06 LAB — HEMOGLOBIN A1C
Est. average glucose Bld gHb Est-mCnc: 126 mg/dL
Hgb A1c MFr Bld: 6 % — ABNORMAL HIGH (ref 4.8–5.6)

## 2024-06-06 LAB — CBC WITH DIFFERENTIAL/PLATELET
Basophils Absolute: 0 x10E3/uL (ref 0.0–0.2)
Basos: 0 %
EOS (ABSOLUTE): 0.1 x10E3/uL (ref 0.0–0.4)
Eos: 1 %
Hematocrit: 38.3 % (ref 34.0–46.6)
Hemoglobin: 12.3 g/dL (ref 11.1–15.9)
Immature Grans (Abs): 0 x10E3/uL (ref 0.0–0.1)
Immature Granulocytes: 0 %
Lymphocytes Absolute: 1.8 x10E3/uL (ref 0.7–3.1)
Lymphs: 22 %
MCH: 29 pg (ref 26.6–33.0)
MCHC: 32.1 g/dL (ref 31.5–35.7)
MCV: 90 fL (ref 79–97)
Monocytes Absolute: 0.6 x10E3/uL (ref 0.1–0.9)
Monocytes: 7 %
Neutrophils Absolute: 5.9 x10E3/uL (ref 1.4–7.0)
Neutrophils: 70 %
Platelets: 369 x10E3/uL (ref 150–450)
RBC: 4.24 x10E6/uL (ref 3.77–5.28)
RDW: 13.7 % (ref 11.7–15.4)
WBC: 8.4 x10E3/uL (ref 3.4–10.8)

## 2024-06-06 LAB — TSH: TSH: 0.878 u[IU]/mL (ref 0.450–4.500)

## 2024-06-06 LAB — HEPATITIS B SURFACE ANTIBODY,QUALITATIVE: Hep B Surface Ab, Qual: NONREACTIVE

## 2024-06-07 LAB — ALKALINE PHOSPHATASE ISOENZYMES
Alkaline Phosphatase: 141 IU/L — AB (ref 49–135)
BONE FRACTION %:: 55 %
Bone Fraction IU/L:: 78 IU/L — AB (ref 18–57)
INTESTINAL FRAC.%:: 0 %
INTESTINALFRAC.IU/L:: 0 IU/L (ref 0–14)
LIVER FRACTION %:: 45 %
Liver Fraction IU/L:: 63 IU/L (ref 23–85)

## 2024-06-07 LAB — VITAMIN D 25 HYDROXY (VIT D DEFICIENCY, FRACTURES): Vit D, 25-Hydroxy: 46.8 ng/mL (ref 30.0–100.0)

## 2024-06-07 LAB — SPECIMEN STATUS REPORT

## 2024-06-13 ENCOUNTER — Ambulatory Visit: Admitting: Family Medicine

## 2024-06-13 ENCOUNTER — Encounter: Payer: Self-pay | Admitting: Family Medicine

## 2024-06-13 ENCOUNTER — Other Ambulatory Visit: Payer: Self-pay | Admitting: Oncology

## 2024-06-13 VITALS — BP 138/67 | HR 85 | Ht 64.0 in | Wt 211.1 lb

## 2024-06-13 DIAGNOSIS — H6502 Acute serous otitis media, left ear: Secondary | ICD-10-CM | POA: Diagnosis not present

## 2024-06-13 DIAGNOSIS — C50212 Malignant neoplasm of upper-inner quadrant of left female breast: Secondary | ICD-10-CM

## 2024-06-13 MED ORDER — AMOXICILLIN 500 MG PO CAPS: 1000.0000 mg | ORAL_CAPSULE | Freq: Three times a day (TID) | ORAL | 0 refills | Status: AC

## 2024-06-13 NOTE — Progress Notes (Signed)
 "     Established patient visit   Patient: Beth [name]   DOB: 06-23-1970   53 y.o. Female  MRN: [id number] Visit Date: 06/13/2024  Today's healthcare provider: Nancyann Perry, MD   Chief Complaint  Patient presents with   Ear Fullness    Left ear feels clogged , started 5x days ago.   Subjective    Ambient AI software was used to assist with clinical note transcription.   History of Present Illness   Beth [name] is a 53 year old female who presents with left ear congestion and noise in the ear.  She has been experiencing ear congestion and noise in the ear for approximately five to six days, starting on Sunday. The sensation is described as the ear feeling 'clogged' with an accompanying noise, prompting her to seek medical attention after the symptoms persisted beyond the holidays.  No recent cold or sinus problems, but she notes occasional morning congestion when the weather is cool. She does not recall having frequent ear infections in the past.  She has not been using any sinus or allergy nasal sprays recently, although she has used Flonase  in the past when she was sick. She is not currently using any medications for her ear symptoms.  No known allergies to antibiotics, except for a topical antibiotic eye drop that caused a skin rash after surgery.      Medications: Show/hide medication list[1] Review of Systems     Objective    BP 138/67 (BP Location: Right Arm, Patient Position: Sitting, Cuff Size: Large)   Pulse 85   Ht 5' 4 (1.626 m)   Wt 211 lb 1.6 oz (95.8 kg)   LMP 12/02/2022   SpO2 98%   BMI 36.24 kg/m   Physical Exam   General Appearance:    Well developed, well nourished female, alert, cooperative, in no acute distress  HENT:   left TM fluid noted, no erythema of TM. Neck without nodes, sinuses nontender, and nasal mucosa pale and congested  Eyes:    PERRL, conjunctiva/corneas clear, EOM's intact       Neurologic:   Awake, alert, oriented x  3.     Assessment & Plan    1. Non-recurrent acute serous otitis media of left ear (Primary) Recommend use of fluticasone  nasal spray which she will get over the counter.   - amoxicillin  (AMOXIL ) 500 MG capsule; Take 2 capsules (1,000 mg total) by mouth 3 (three) times daily for 5 days.  Dispense: 30 capsule; Refill: 0  Call if symptoms change or if not rapidly improving.       Nancyann Perry, MD  [facility] [phone] (phone) [phone] (fax)  [facility]    [1]  Outpatient Medications Prior to Visit  Medication Sig   acetaminophen  (TYLENOL ) 650 MG CR tablet Take 1,300 mg by mouth every 8 (eight) hours as needed for pain.   Artificial Tear Solution (TEARS NATURALE OP) Place 1 drop into both eyes daily.   Calcium Carbonate-Vitamin D  (CALCIUM 600+D PO) Take 2 tablets by mouth daily.   CRANBERRY PO Take 2 tablets by mouth daily.   FIBER PO Take 1 capsule by mouth daily. Gummy   fish oil-omega-3 fatty acids 1000 MG capsule Take 2 g by mouth daily.   Garlic Oil 1000 MG CAPS Take 1,000 mg by mouth daily.   ibuprofen  (ADVIL ) 200 MG tablet Take 200 mg by mouth every 4 (four) hours as needed.  Ibuprofen -diphenhydrAMINE HCl (ADVIL  PM) 200-25 MG CAPS Take 1 tablet by mouth at bedtime as needed (sleep).   letrozole  (FEMARA ) 2.5 MG tablet TAKE 1 TABLET(2.5 MG) BY MOUTH DAILY   loratadine (CLARITIN) 10 MG tablet Take 10 mg by mouth daily as needed for allergies.   Multiple Vitamin (MULTIVITAMIN WITH MINERALS) TABS tablet Take 1 tablet by mouth daily. Women's One A Day   naproxen  (NAPROSYN ) 500 MG tablet Take 1 tablet (500 mg total) by mouth 2 (two) times daily.   sertraline  (ZOLOFT ) 50 MG tablet Take 1 tablet (50 mg total) by mouth daily.   vitamin B-12 (CYANOCOBALAMIN) 1000 MCG tablet Take 1,000 mcg by mouth daily.   White Petrolatum-Mineral Oil (GENTEAL TEARS NIGHT-TIME) OINT Apply 1 drop to eye at bedtime.   No facility-administered  medications prior to visit.   "

## 2024-06-18 ENCOUNTER — Encounter: Payer: Self-pay | Admitting: Family Medicine

## 2024-06-18 DIAGNOSIS — G8929 Other chronic pain: Secondary | ICD-10-CM

## 2024-07-22 ENCOUNTER — Ambulatory Visit: Admitting: Family Medicine

## 2024-08-04 ENCOUNTER — Ambulatory Visit: Admitting: Family Medicine

## 2024-08-13 ENCOUNTER — Encounter

## 2024-09-24 ENCOUNTER — Ambulatory Visit: Admitting: Oncology
